# Patient Record
Sex: Male | Born: 1935 | Race: White | Hispanic: No | Marital: Married | Smoking: Former smoker
Health system: Southern US, Community
[De-identification: ages and names within clinical notes are randomized; demographics above are authoritative.]

## PROBLEM LIST (undated history)

## (undated) DIAGNOSIS — N390 Urinary tract infection, site not specified: Secondary | ICD-10-CM

## (undated) DIAGNOSIS — G2581 Restless legs syndrome: Secondary | ICD-10-CM

## (undated) DIAGNOSIS — J449 Chronic obstructive pulmonary disease, unspecified: Secondary | ICD-10-CM

## (undated) DIAGNOSIS — E785 Hyperlipidemia, unspecified: Secondary | ICD-10-CM

## (undated) DIAGNOSIS — D649 Anemia, unspecified: Secondary | ICD-10-CM

## (undated) DIAGNOSIS — I82409 Acute embolism and thrombosis of unspecified deep veins of unspecified lower extremity: Secondary | ICD-10-CM

## (undated) DIAGNOSIS — I639 Cerebral infarction, unspecified: Secondary | ICD-10-CM

## (undated) HISTORY — DX: Cerebral infarction, unspecified: I63.9

---

## 2011-04-10 ENCOUNTER — Ambulatory Visit: Payer: Self-pay | Admitting: Ophthalmology

## 2011-04-10 DIAGNOSIS — I499 Cardiac arrhythmia, unspecified: Secondary | ICD-10-CM

## 2011-04-17 ENCOUNTER — Ambulatory Visit: Payer: Self-pay | Admitting: Ophthalmology

## 2011-06-05 ENCOUNTER — Ambulatory Visit: Payer: Self-pay | Admitting: Ophthalmology

## 2013-10-30 HISTORY — PX: CATARACT EXTRACTION: SUR2

## 2014-07-02 DIAGNOSIS — J449 Chronic obstructive pulmonary disease, unspecified: Secondary | ICD-10-CM | POA: Insufficient documentation

## 2017-11-16 DIAGNOSIS — N401 Enlarged prostate with lower urinary tract symptoms: Secondary | ICD-10-CM | POA: Insufficient documentation

## 2017-11-16 DIAGNOSIS — R351 Nocturia: Secondary | ICD-10-CM

## 2018-01-28 DIAGNOSIS — I82409 Acute embolism and thrombosis of unspecified deep veins of unspecified lower extremity: Secondary | ICD-10-CM

## 2018-01-28 HISTORY — DX: Acute embolism and thrombosis of unspecified deep veins of unspecified lower extremity: I82.409

## 2018-02-23 ENCOUNTER — Other Ambulatory Visit: Payer: Self-pay

## 2018-02-23 ENCOUNTER — Emergency Department: Payer: Medicare Other

## 2018-02-23 ENCOUNTER — Emergency Department
Admission: EM | Admit: 2018-02-23 | Discharge: 2018-02-23 | Disposition: A | Discharge status: Home / Self Care | Payer: Medicare Other | Source: Home / Self Care | Attending: Emergency Medicine | Admitting: Emergency Medicine

## 2018-02-23 DIAGNOSIS — S51011A Laceration without foreign body of right elbow, initial encounter: Secondary | ICD-10-CM | POA: Insufficient documentation

## 2018-02-23 DIAGNOSIS — Z79899 Other long term (current) drug therapy: Secondary | ICD-10-CM | POA: Insufficient documentation

## 2018-02-23 DIAGNOSIS — W19XXXA Unspecified fall, initial encounter: Secondary | ICD-10-CM

## 2018-02-23 DIAGNOSIS — J449 Chronic obstructive pulmonary disease, unspecified: Secondary | ICD-10-CM

## 2018-02-23 DIAGNOSIS — Y998 Other external cause status: Secondary | ICD-10-CM | POA: Insufficient documentation

## 2018-02-23 DIAGNOSIS — W01190A Fall on same level from slipping, tripping and stumbling with subsequent striking against furniture, initial encounter: Secondary | ICD-10-CM | POA: Insufficient documentation

## 2018-02-23 DIAGNOSIS — Y939 Activity, unspecified: Secondary | ICD-10-CM | POA: Insufficient documentation

## 2018-02-23 DIAGNOSIS — I63522 Cerebral infarction due to unspecified occlusion or stenosis of left anterior cerebral artery: Secondary | ICD-10-CM | POA: Diagnosis not present

## 2018-02-23 DIAGNOSIS — R531 Weakness: Secondary | ICD-10-CM | POA: Insufficient documentation

## 2018-02-23 DIAGNOSIS — Z23 Encounter for immunization: Secondary | ICD-10-CM

## 2018-02-23 DIAGNOSIS — Y929 Unspecified place or not applicable: Secondary | ICD-10-CM

## 2018-02-23 HISTORY — DX: Restless legs syndrome: G25.81

## 2018-02-23 HISTORY — DX: Chronic obstructive pulmonary disease, unspecified: J44.9

## 2018-02-23 LAB — CBC
HEMATOCRIT: 34.7 % — AB (ref 40.0–52.0)
Hemoglobin: 11.5 g/dL — ABNORMAL LOW (ref 13.0–18.0)
MCH: 27.8 pg (ref 26.0–34.0)
MCHC: 33.2 g/dL (ref 32.0–36.0)
MCV: 83.7 fL (ref 80.0–100.0)
PLATELETS: 276 10*3/uL (ref 150–440)
RBC: 4.15 MIL/uL — ABNORMAL LOW (ref 4.40–5.90)
RDW: 15.4 % — AB (ref 11.5–14.5)
WBC: 8.7 10*3/uL (ref 3.8–10.6)

## 2018-02-23 LAB — COMPREHENSIVE METABOLIC PANEL
ALBUMIN: 3 g/dL — AB (ref 3.5–5.0)
ALT: 10 U/L — ABNORMAL LOW (ref 17–63)
AST: 23 U/L (ref 15–41)
Alkaline Phosphatase: 65 U/L (ref 38–126)
Anion gap: 6 (ref 5–15)
BUN: 25 mg/dL — AB (ref 6–20)
CHLORIDE: 102 mmol/L (ref 101–111)
CO2: 27 mmol/L (ref 22–32)
Calcium: 8.5 mg/dL — ABNORMAL LOW (ref 8.9–10.3)
Creatinine, Ser: 1.04 mg/dL (ref 0.61–1.24)
GFR calc Af Amer: >60 mL/min (ref >60)
GFR calc non Af Amer: >60 mL/min (ref >60)
GLUCOSE: 105 mg/dL — AB (ref 65–99)
POTASSIUM: 4.4 mmol/L (ref 3.5–5.1)
Sodium: 135 mmol/L (ref 135–145)
Total Bilirubin: 0.5 mg/dL (ref 0.3–1.2)
Total Protein: 7.9 g/dL (ref 6.5–8.1)

## 2018-02-23 LAB — TROPONIN I: Troponin I: <0.03 ng/mL (ref <0.03)

## 2018-02-23 MED ORDER — TETANUS-DIPHTH-ACELL PERTUSSIS 5-2.5-18.5 LF-MCG/0.5 IM SUSP: 0.5000 mL | Freq: Once | INTRAMUSCULAR | Status: DC

## 2018-02-23 MED ORDER — TETANUS-DIPHTH-ACELL PERTUSSIS 5-2.5-18.5 LF-MCG/0.5 IM SUSP
0.5000 mL | Freq: Once | INTRAMUSCULAR | Status: AC
  Administered 2018-02-23: 0.5 mL via INTRAMUSCULAR
  Filled 2018-02-23: qty 0.5

## 2018-02-23 NOTE — ED Notes (Signed)
Fall alarm placed on patient at this time.

## 2018-02-23 NOTE — Discharge Instructions (Signed)
Please make an appointment to follow-up with your primary care physician this coming Monday for reevaluation and return to the emergency department sooner for any concerns.  It was a pleasure to take care of you today, and thank you for coming to our emergency department.  If you have any questions or concerns before leaving please ask the nurse to grab me and I'm more than happy to go through your aftercare instructions again.  If you were prescribed any opioid pain medication today such as Norco, Vicodin, Percocet, morphine, hydrocodone, or oxycodone please make sure you do not drive when you are taking this medication as it can alter your ability to drive safely.  If you have any concerns once you are home that you are not improving or are in fact getting worse before you can make it to your follow-up appointment, please do not hesitate to call 911 and come back for further evaluation.  Merrily Brittle, MD  Results for orders placed or performed during the hospital encounter of 02/23/18  CBC  Result Value Ref Range   WBC 8.7 3.8 - 10.6 K/uL   RBC 4.15 (L) 4.40 - 5.90 MIL/uL   Hemoglobin 11.5 (L) 13.0 - 18.0 g/dL   HCT 18.5 (L) 90.9 - 31.1 %   MCV 83.7 80.0 - 100.0 fL   MCH 27.8 26.0 - 34.0 pg   MCHC 33.2 32.0 - 36.0 g/dL   RDW 21.6 (H) 24.4 - 69.5 %   Platelets 276 150 - 440 K/uL  Comprehensive metabolic panel  Result Value Ref Range   Sodium 135 135 - 145 mmol/L   Potassium 4.4 3.5 - 5.1 mmol/L   Chloride 102 101 - 111 mmol/L   CO2 27 22 - 32 mmol/L   Glucose, Bld 105 (H) 65 - 99 mg/dL   BUN 25 (H) 6 - 20 mg/dL   Creatinine, Ser 0.72 0.61 - 1.24 mg/dL   Calcium 8.5 (L) 8.9 - 10.3 mg/dL   Total Protein 7.9 6.5 - 8.1 g/dL   Albumin 3.0 (L) 3.5 - 5.0 g/dL   AST 23 15 - 41 U/L   ALT 10 (L) 17 - 63 U/L   Alkaline Phosphatase 65 38 - 126 U/L   Total Bilirubin 0.5 0.3 - 1.2 mg/dL   GFR calc non Af Amer >60 >60 mL/min   GFR calc Af Amer >60 >60 mL/min   Anion gap 6 5 - 15    Troponin I  Result Value Ref Range   Troponin I <0.03 <0.03 ng/mL   Dg Chest 1 View  Result Date: 02/23/2018 CLINICAL DATA:  Nonsmoker, imbalance, frequent falls. EXAM: CHEST  1 VIEW COMPARISON:  None. FINDINGS: Cardiomegaly. No consolidation or edema. Slight blunting both CP angles. No visible pneumothorax or rib fracture. IMPRESSION: Cardiomegaly.  No active infiltrates or failure. Electronically Signed   By: Elsie Stain M.D.   On: 02/23/2018 15:06   Ct Head Wo Contrast  Result Date: 02/23/2018 CLINICAL DATA:  Multiple falls recently, weak, unsteady gait. EXAM: CT HEAD WITHOUT CONTRAST TECHNIQUE: Contiguous axial images were obtained from the base of the skull through the vertex without intravenous contrast. COMPARISON:  None. FINDINGS: Brain: No evidence for acute infarction, hemorrhage, mass lesion, hydrocephalus, or extra-axial fluid. Generalized atrophy. Hypoattenuation of white matter, chronic microvascular ischemic change. Vascular: Calcification of the cavernous internal carotid arteries consistent with cerebrovascular atherosclerotic disease. No signs of intracranial large vessel occlusion. Skull: Calvarium intact. Sinuses/Orbits: No significant opacity or layering fluid in the sinuses.  BILATERAL cataract extraction but no acute orbital findings. Other: No significant mastoid fluid. IMPRESSION: Atrophy and small vessel disease.  No acute intracranial findings. Electronically Signed   By: Elsie Stain M.D.   On: 02/23/2018 15:58

## 2018-02-23 NOTE — ED Notes (Signed)
NAD noted at time of D/C. Pt D/C into the care of his wife. Pt's wife signed for D/C instructions. Steri-strips and dry bandage applied to skin tear on R elbow at this time. Pt and wife deny any questions regarding D/C.

## 2018-02-23 NOTE — ED Triage Notes (Signed)
Pt arrives from home via [facility] for multiple falls. Pt fell yesterday and today. Arrives with 20 R FA. R elbow skin tear. Denies hitting head. Uses cane at home. Pt states he hit the bed when he fell. Denies blood thinner use. Alert and oriented. EMS reports pt being unable to walk, very unsteady. BP sitting 130, BP standing 110. CBG 126. No cardiac hx.

## 2018-02-23 NOTE — ED Provider Notes (Signed)
[facility] Emergency Department Provider Note  ____________________________________________   First MD Initiated Contact with Patient 02/23/18 1411     (approximate)  I have reviewed the triage vital signs and the nursing notes.   HISTORY  Chief Complaint Fall   HPI Allen Potter is a 84 y.o. male who comes to the emergency department via EMS with multiple falls for the past several days.  The patient himself states that he feels somewhat lightheaded and has had difficulty ambulating.  He has no headache.  He normally ambulates with a cane.  Today when he fell he hit his right elbow against the bedside dresser and suffered a small skin tear.  He is currently not in any pain.  He does feel somewhat lightheaded.  He denies chest pain or palpitations.  He denies numbness or weakness.  He denies double vision or blurred vision.  He denies vertigo.  Past Medical History:  Diagnosis Date  . COPD (chronic obstructive pulmonary disease) (HCC)   . Restless leg     There are no active problems to display for this patient.   History reviewed. No pertinent surgical history.  Prior to Admission medications   Medication Sig Start Date End Date Taking? Authorizing Provider  albuterol (VENTOLIN HFA) 108 (90 Base) MCG/ACT inhaler Inhale 2 puffs into the lungs every 6 (six) hours as needed. 10/02/17   [provider]  ferrous sulfate 325 (65 FE) MG tablet Take 1 tablet by mouth daily. 12/31/17 12/31/18  [provider]  fluticasone furoate-vilanterol (BREO ELLIPTA) 100-25 MCG/INH AEPB Inhale 1 puff into the lungs daily. 10/02/17   [provider]  tiotropium (SPIRIVA HANDIHALER) 18 MCG inhalation capsule Place 1 puff into inhaler and inhale daily. 10/02/17   [provider]    Allergies Patient has no known allergies.  History reviewed. No pertinent family history.  Social History Social History   Tobacco Use  . Smoking status:  Never Smoker  . Smokeless tobacco: Never Used  Substance Use Topics  . Alcohol use: Not Currently    Frequency: Never  . Drug use: Not on file    Review of Systems Constitutional: No fever/chills Eyes: No visual changes. ENT: No sore throat. Cardiovascular: Denies chest pain. Respiratory: Denies shortness of breath. Gastrointestinal: No abdominal pain.  No nausea, no vomiting.  No diarrhea.  No constipation. Genitourinary: Negative for dysuria. Musculoskeletal: Negative for back pain. Skin: Positive for skin tear Neurological: Negative for headaches, focal weakness or numbness.   ____________________________________________   PHYSICAL EXAM:  VITAL SIGNS: ED Triage Vitals  Enc Vitals Group     BP 02/23/18 1406 133/70     Pulse Rate 02/23/18 1406 89     Resp 02/23/18 1406 (!) 22     Temp 02/23/18 1406 98.3 F (36.8 C)     Temp Source 02/23/18 1406 Oral     SpO2 02/23/18 1406 95 %     Weight 02/23/18 1403 170 lb (77.1 kg)     Height 02/23/18 1403 5\' 9"  (1.753 m)     Head Circumference --      Peak Flow --      Pain Score 02/23/18 1403 0     Pain Loc --      Pain Edu? --      Excl. in GC? --     Constitutional: Alert and oriented x4 pleasant cooperative speaks in full clear sentences no diaphoresis Eyes: PERRL EOMI. no nystagmus appreciated Head: Atraumatic. Nose: No  congestion/rhinnorhea. Mouth/Throat: No trismus Neck: No stridor.   Cardiovascular: Normal rate, regular rhythm. Grossly normal heart sounds.  Good peripheral circulation. Respiratory: Normal respiratory effort.  No retractions. Lungs CTAB and moving good air Gastrointestinal: Soft nontender Musculoskeletal: No lower extremity edema   Neurologic:  Normal speech and language. No gross focal neurologic deficits are appreciated. Skin: Small skin tear to right elbow Psychiatric: Mood and affect are normal. Speech and behavior are normal.    ____________________________________________     DIFFERENTIAL includes but not limited to  Cardiogenic syncope, vasovagal syncope, dehydration, pulmonary embolism ____________________________________________   LABS (all labs ordered are listed, but only abnormal results are displayed)  Labs Reviewed  CBC - Abnormal; Notable for the following components:      Result Value   RBC 4.15 (*)    Hemoglobin 11.5 (*)    HCT 34.7 (*)    RDW 15.4 (*)    All other components within normal limits  COMPREHENSIVE METABOLIC PANEL - Abnormal; Notable for the following components:   Glucose, Bld 105 (*)    BUN 25 (*)    Calcium 8.5 (*)    Albumin 3.0 (*)    ALT 10 (*)    All other components within normal limits  TROPONIN I    Lab work reviewed by me with low albumin consistent with chronically poor nutrition __________________________________________  EKG  ED ECG REPORT I, Merrily Brittle, the attending physician, personally viewed and interpreted this ECG.  Date: 02/23/2018 EKG Time:  Rate: 91 Rhythm: normal sinus rhythm QRS Axis: Leftward axis Intervals: Long QTc ST/T Wave abnormalities: normal Narrative Interpretation: no evidence of acute ischemia  ____________________________________________  RADIOLOGY  Head CT reviewed by me with chronic changes but no acute disease ____________________________________________   PROCEDURES  Procedure(s) performed: no  Procedures  Critical Care performed: no  Observation: no ____________________________________________   INITIAL IMPRESSION / ASSESSMENT AND PLAN / ED COURSE  Pertinent labs & imaging results that were available during my care of the patient were reviewed by me and considered in my medical decision making (see chart for details).  The patient arrives somewhat lightheaded after several falls recently.  He has normal strength in all 4 extremities.  Head CT obtained given his age and fall and fortunately is negative for bleed.  EKG shows a left bundle branch block  although no signs of acute ischemia.  He currently feels well.  I had a lengthy discussion with the patient and his wife and I offered to keep the patient in the emergency department for physical therapy and social work evaluation as the patient has had increasing falls however they declined stating they are comfortable having him go home and be evaluated as an outpatient.  Patient is discharged home in improved condition with strict return precautions.        ____________________________________________   FINAL CLINICAL IMPRESSION(S) / ED DIAGNOSES  Final diagnoses:  Fall, initial encounter  Skin tear of right elbow without complication, initial encounter      NEW MEDICATIONS STARTED DURING THIS VISIT:  There are no discharge medications for this patient.    Note:  This document was prepared using Dragon voice recognition software and may include unintentional dictation errors.     Merrily Brittle, MD 02/26/18 2146

## 2018-02-25 ENCOUNTER — Inpatient Hospital Stay: Payer: Medicare Other

## 2018-02-25 ENCOUNTER — Other Ambulatory Visit: Payer: Self-pay

## 2018-02-25 ENCOUNTER — Inpatient Hospital Stay
Admission: AD | Admit: 2018-02-25 | Discharge: 2018-02-28 | DRG: 065 | Disposition: A | Discharge status: Skilled Nursing Facility | Payer: Medicare Other | Source: Ambulatory Visit | Attending: Internal Medicine | Admitting: Internal Medicine

## 2018-02-25 DIAGNOSIS — R4781 Slurred speech: Secondary | ICD-10-CM | POA: Diagnosis present

## 2018-02-25 DIAGNOSIS — I63322 Cerebral infarction due to thrombosis of left anterior cerebral artery: Secondary | ICD-10-CM | POA: Diagnosis not present

## 2018-02-25 DIAGNOSIS — Z23 Encounter for immunization: Secondary | ICD-10-CM

## 2018-02-25 DIAGNOSIS — R296 Repeated falls: Secondary | ICD-10-CM | POA: Diagnosis present

## 2018-02-25 DIAGNOSIS — R29710 NIHSS score 10: Secondary | ICD-10-CM | POA: Diagnosis present

## 2018-02-25 DIAGNOSIS — N400 Enlarged prostate without lower urinary tract symptoms: Secondary | ICD-10-CM | POA: Diagnosis present

## 2018-02-25 DIAGNOSIS — G8191 Hemiplegia, unspecified affecting right dominant side: Secondary | ICD-10-CM | POA: Diagnosis present

## 2018-02-25 DIAGNOSIS — J449 Chronic obstructive pulmonary disease, unspecified: Secondary | ICD-10-CM | POA: Diagnosis present

## 2018-02-25 DIAGNOSIS — I63522 Cerebral infarction due to unspecified occlusion or stenosis of left anterior cerebral artery: Principal | ICD-10-CM | POA: Diagnosis present

## 2018-02-25 DIAGNOSIS — Z66 Do not resuscitate: Secondary | ICD-10-CM | POA: Diagnosis present

## 2018-02-25 DIAGNOSIS — Z9114 Patient's other noncompliance with medication regimen: Secondary | ICD-10-CM

## 2018-02-25 DIAGNOSIS — I639 Cerebral infarction, unspecified: Secondary | ICD-10-CM | POA: Diagnosis not present

## 2018-02-25 DIAGNOSIS — E860 Dehydration: Secondary | ICD-10-CM | POA: Diagnosis present

## 2018-02-25 DIAGNOSIS — G2581 Restless legs syndrome: Secondary | ICD-10-CM | POA: Diagnosis present

## 2018-02-25 DIAGNOSIS — N179 Acute kidney failure, unspecified: Secondary | ICD-10-CM | POA: Diagnosis present

## 2018-02-25 LAB — CBC
HEMATOCRIT: 36.7 % — AB (ref 40.0–52.0)
HEMOGLOBIN: 12.3 g/dL — AB (ref 13.0–18.0)
MCH: 28.4 pg (ref 26.0–34.0)
MCHC: 33.5 g/dL (ref 32.0–36.0)
MCV: 84.5 fL (ref 80.0–100.0)
Platelets: 265 10*3/uL (ref 150–440)
RBC: 4.34 MIL/uL — AB (ref 4.40–5.90)
RDW: 15.9 % — ABNORMAL HIGH (ref 11.5–14.5)
WBC: 9.7 10*3/uL (ref 3.8–10.6)

## 2018-02-25 LAB — COMPREHENSIVE METABOLIC PANEL
ALT: 16 U/L — AB (ref 17–63)
AST: 30 U/L (ref 15–41)
Albumin: 3.1 g/dL — ABNORMAL LOW (ref 3.5–5.0)
Alkaline Phosphatase: 70 U/L (ref 38–126)
Anion gap: 8 (ref 5–15)
BILIRUBIN TOTAL: 0.5 mg/dL (ref 0.3–1.2)
BUN: 35 mg/dL — ABNORMAL HIGH (ref 6–20)
CO2: 29 mmol/L (ref 22–32)
CREATININE: 1.13 mg/dL (ref 0.61–1.24)
Calcium: 8.9 mg/dL (ref 8.9–10.3)
Chloride: 101 mmol/L (ref 101–111)
GFR calc Af Amer: >60 mL/min (ref >60)
GFR, EST NON AFRICAN AMERICAN: 56 mL/min — AB (ref >60)
GLUCOSE: 98 mg/dL (ref 65–99)
Potassium: 4.7 mmol/L (ref 3.5–5.1)
Sodium: 138 mmol/L (ref 135–145)
TOTAL PROTEIN: 8.4 g/dL — AB (ref 6.5–8.1)

## 2018-02-25 LAB — PROTIME-INR
INR: 1.19
Prothrombin Time: 15 seconds (ref 11.4–15.2)

## 2018-02-25 MED ORDER — ACETAMINOPHEN 325 MG PO TABS: 650.0000 mg | ORAL_TABLET | ORAL | Status: DC | PRN

## 2018-02-25 MED ORDER — IPRATROPIUM-ALBUTEROL 0.5-2.5 (3) MG/3ML IN SOLN: 3.0000 mL | Freq: Four times a day (QID) | RESPIRATORY_TRACT | Status: DC | PRN

## 2018-02-25 MED ORDER — HEPARIN SODIUM (PORCINE) 5000 UNIT/ML IJ SOLN
5000.0000 [IU] | Freq: Three times a day (TID) | INTRAMUSCULAR | Status: DC
  Administered 2018-02-25 – 2018-02-28 (×7): 5000 [IU] via SUBCUTANEOUS
  Filled 2018-02-25 (×7): qty 1

## 2018-02-25 MED ORDER — ACETAMINOPHEN 160 MG/5ML PO SOLN
650.0000 mg | ORAL | Status: DC | PRN
  Filled 2018-02-25: qty 20.3

## 2018-02-25 MED ORDER — SIMVASTATIN 20 MG PO TABS
40.0000 mg | ORAL_TABLET | Freq: Every day | ORAL | Status: DC
  Administered 2018-02-26 – 2018-02-27 (×2): 40 mg via ORAL
  Filled 2018-02-25 (×2): qty 2

## 2018-02-25 MED ORDER — ACETAMINOPHEN 650 MG RE SUPP: 650.0000 mg | RECTAL | Status: DC | PRN

## 2018-02-25 MED ORDER — STROKE: EARLY STAGES OF RECOVERY BOOK
Freq: Once | Status: AC
  Administered 2018-02-25: 18:00:00

## 2018-02-25 MED ORDER — HYDRALAZINE HCL 20 MG/ML IJ SOLN: 10.0000 mg | INTRAMUSCULAR | Status: DC | PRN

## 2018-02-25 MED ORDER — SENNOSIDES-DOCUSATE SODIUM 8.6-50 MG PO TABS
1.0000 | ORAL_TABLET | Freq: Every evening | ORAL | Status: DC | PRN
  Administered 2018-02-27: 1 via ORAL
  Filled 2018-02-25: qty 1

## 2018-02-25 MED ORDER — SODIUM CHLORIDE 0.9 % IV SOLN
INTRAVENOUS | Status: DC
  Administered 2018-02-25 – 2018-02-28 (×4): via INTRAVENOUS

## 2018-02-25 MED ORDER — ASPIRIN EC 325 MG PO TBEC
325.0000 mg | DELAYED_RELEASE_TABLET | Freq: Every day | ORAL | Status: DC
  Administered 2018-02-26 – 2018-02-27 (×2): 325 mg via ORAL
  Filled 2018-02-25 (×2): qty 1

## 2018-02-25 NOTE — Progress Notes (Signed)
Family Meeting Note  Advance Directive:yes  Today a meeting took place with the Patient, patient's wife, family friend.  Patient is able to participate   The following clinical team members were present during this meeting:MD  The following were discussed:Patient's diagnosis: CVA, BPH, COPD, frequent falls, acute kidney injury, dehydration, Patient's progosis: Unable to determine and Goals for treatment: DNR  Additional follow-up to be provided: prn  Time spent during discussion:20 minutes  Bertrum Sol, MD

## 2018-02-25 NOTE — Progress Notes (Signed)
   02/25/18 1839  Clinical Encounter Type  Visited With Patient  Visit Type Initial  Referral From Nurse  Consult/Referral To Chaplain   Chaplain provided information regarding advanced directives.  A notary will be available in the morning to complete documentation.  Deep Bonawitz Zenaida Niece [employer]

## 2018-02-25 NOTE — H&P (Signed)
[facility] - [place] at [facility]   PATIENT NAME: Allen Potter    MR#:  [id number]  DATE OF BIRTH:  1927/02/12  DATE OF ADMISSION:  02/25/2018  PRIMARY CARE PHYSICIAN: Barbette Reichmann, MD   REQUESTING/REFERRING PHYSICIAN:   CHIEF COMPLAINT:  No chief complaint on file.   HISTORY OF PRESENT ILLNESS: Allen Potter  is a 84 y.o. male with a known history per below which also includes COPD, BPH, psoriasis, cataracts, presents with falling events that started on Friday and continue through to today, patient with right upper extremity and lower extremity weakness, was evaluated in the emergency room over the weekend, patient was subsequently discharged home, patient seen by his primary care provider today and was accepted as a direct admission for acute cerebrovascular accident with right hemiparesis, patient evaluated at the bedside with his wife and family friend, noted personality change as patient is usually the more talkative jovial person, CT head over the weekend was negative for any acute process, examination consistent with dehydration, patient is now being admitted for acute probable ischemic supravascular accident, acute kidney injury with dehydration.  PAST MEDICAL HISTORY:   Past Medical History:  Diagnosis Date  . COPD (chronic obstructive pulmonary disease) (HCC)   . Restless leg     PAST SURGICAL HISTORY:  Cataract extraction  SOCIAL HISTORY:  Social History   Tobacco Use  . Smoking status: Never Smoker  . Smokeless tobacco: Never Used  Substance Use Topics  . Alcohol use: Not Currently    Frequency: Never    FAMILY HISTORY:  Hypertension  DRUG ALLERGIES: No Known Allergies  REVIEW OF SYSTEMS:   CONSTITUTIONAL: No fever, fatigue or weakness.  Frequent falls EYES: No blurred or double vision.  EARS, NOSE, AND THROAT: No tinnitus or ear pain.  RESPIRATORY: No cough, shortness of breath, wheezing or hemoptysis.  CARDIOVASCULAR: No chest pain,  orthopnea, edema.  GASTROINTESTINAL: No nausea, vomiting, diarrhea or abdominal pain.  GENITOURINARY: No dysuria, hematuria.  ENDOCRINE: No polyuria, nocturia,  HEMATOLOGY: No anemia, easy bruising or bleeding SKIN: No rash or lesion. MUSCULOSKELETAL: No joint pain or arthritis.   NEUROLOGIC: No tingling, numbness, right upper/lower extremity weakness.  Less verbal PSYCHIATRY: No anxiety or depression.   MEDICATIONS AT HOME:  Prior to Admission medications   Not on File      PHYSICAL EXAMINATION:   VITAL SIGNS: Blood pressure 129/83, pulse 94, temperature (!) 97.4 F (36.3 C), temperature source Oral, resp. rate 20, height 5\' 9"  (1.753 m), weight 70.1 kg (154 lb 9.6 oz), SpO2 98 %.  GENERAL:  84 y.o.-year-old patient lying in the bed with no acute distress.  Frail-appearing EYES: Pupils equal, round, reactive to light and accommodation. No scleral icterus. Extraocular muscles intact.  HEENT: Head atraumatic, normocephalic. Oropharynx and nasopharynx clear.  NECK:  Supple, no jugular venous distention. No thyroid enlargement, no tenderness.  LUNGS: Normal breath sounds bilaterally, no wheezing, rales,rhonchi or crepitation. No use of accessory muscles of respiration.  CARDIOVASCULAR: S1, S2 normal. No murmurs, rubs, or gallops.  ABDOMEN: Soft, nontender, nondistended. Bowel sounds present. No organomegaly or mass.  EXTREMITIES: No pedal edema, cyanosis, or clubbing.  NEUROLOGIC: Cranial nerves II through XII are intact. MAES.  No pronator drift, positive right upper extremity dysmetria, 4+ out of 5 strength in right upper and lower extremity compared to left gait not checked.  PSYCHIATRIC: The patient is alert, awake, mild confusion/disorientation noted   SKIN: No obvious rash, lesion, or ulcer.   LABORATORY PANEL:  CBC Recent Labs  Lab 02/23/18 1404  WBC 8.7  HGB 11.5*  HCT 34.7*  PLT 276  MCV 83.7  MCH 27.8  MCHC 33.2  RDW 15.4*    ------------------------------------------------------------------------------------------------------------------  Chemistries  Recent Labs  Lab 02/23/18 1404  NA 135  K 4.4  CL 102  CO2 27  GLUCOSE 105*  BUN 25*  CREATININE 1.04  CALCIUM 8.5*  AST 23  ALT 10*  ALKPHOS 65  BILITOT 0.5   ------------------------------------------------------------------------------------------------------------------ estimated creatinine clearance is 48.7 mL/min (by C-G formula based on SCr of 1.04 mg/dL). ------------------------------------------------------------------------------------------------------------------ No results for input(s): TSH, T4TOTAL, T3FREE, THYROIDAB in the last 72 hours.  Invalid input(s): FREET3   Coagulation profile No results for input(s): INR, PROTIME in the last 168 hours. ------------------------------------------------------------------------------------------------------------------- No results for input(s): DDIMER in the last 72 hours. -------------------------------------------------------------------------------------------------------------------  Cardiac Enzymes Recent Labs  Lab 02/23/18 1404  TROPONINI <0.03   ------------------------------------------------------------------------------------------------------------------ Invalid input(s): POCBNP  ---------------------------------------------------------------------------------------------------------------  Urinalysis No results found for: COLORURINE, APPEARANCEUR, LABSPEC, PHURINE, GLUCOSEU, HGBUR, BILIRUBINUR, KETONESUR, PROTEINUR, UROBILINOGEN, NITRITE, LEUKOCYTESUR   RADIOLOGY: No results found.  EKG: Orders placed or performed during the hospital encounter of 02/23/18  . ED EKG  . ED EKG  . EKG 12-Lead  . EKG 12-Lead  . EKG 12-Lead  . EKG 12-Lead    IMPRESSION AND PLAN: *Acute probable ischemic cerebral vascular accident Presenting with right hemiparesis, acute change in  behavior, less talkative, right upper extremity dysmetria, frequent falls starting on Friday Admit to regular nursing for bed as direct admission by primary care provider on our CVA protocol, neurology to see, start aspirin, statin therapy, check lipids in the morning, MRI of the brain, carotid Dopplers, echocardiogram, aspiration/fall functions, speech therapy/physical therapy to evaluate/treat, check admission blood work, EKG, chest x-ray, and continue close medical monitoring  *Acute probable acute kidney injury with dehydration Profound dry mucous membranes with poor skin turgor IV fluids for rehydration, follow-up of blood work  *COPD without exacerbation Stable Breathing treatments as needed  *Acute frequent falls Most likely secondary to above plan of care stated above   *Incomplete MAR Complete when available     All the records are reviewed and case discussed with ED provider. Management plans discussed with the patient, family and they are in agreement.  CODE STATUS:dnr    TOTAL TIME TAKING CARE OF THIS PATIENT: 45 minutes.    Evelena Asa Vadhir Mcnay M.D on 02/25/2018   Between 7am to 6pm - Pager - [phone]  After 6pm go to [email or web address] - password [email or web address]  [facility]  Office  [phone]  CC: Primary care physician; Barbette Reichmann, MD   Note: This dictation was prepared with Dragon dictation along with smaller phrase technology. Any transcriptional errors that result from this process are unintentional.

## 2018-02-26 ENCOUNTER — Inpatient Hospital Stay: Payer: Medicare Other

## 2018-02-26 DIAGNOSIS — I63322 Cerebral infarction due to thrombosis of left anterior cerebral artery: Secondary | ICD-10-CM

## 2018-02-26 LAB — BASIC METABOLIC PANEL
ANION GAP: 9 (ref 5–15)
BUN: 33 mg/dL — ABNORMAL HIGH (ref 6–20)
CHLORIDE: 103 mmol/L (ref 101–111)
CO2: 25 mmol/L (ref 22–32)
Calcium: 8.4 mg/dL — ABNORMAL LOW (ref 8.9–10.3)
Creatinine, Ser: 1 mg/dL (ref 0.61–1.24)
GFR calc Af Amer: >60 mL/min (ref >60)
GFR calc non Af Amer: >60 mL/min (ref >60)
Glucose, Bld: 78 mg/dL (ref 65–99)
POTASSIUM: 4.1 mmol/L (ref 3.5–5.1)
Sodium: 137 mmol/L (ref 135–145)

## 2018-02-26 LAB — LIPID PANEL
CHOL/HDL RATIO: 2.9 ratio
CHOLESTEROL: 137 mg/dL (ref 0–200)
HDL: 47 mg/dL (ref >40)
LDL Cholesterol: 75 mg/dL (ref 0–99)
TRIGLYCERIDES: 76 mg/dL (ref <150)
VLDL: 15 mg/dL (ref 0–40)

## 2018-02-26 LAB — HEMOGLOBIN A1C
Hgb A1c MFr Bld: 5.9 % — ABNORMAL HIGH (ref 4.8–5.6)
MEAN PLASMA GLUCOSE: 122.63 mg/dL

## 2018-02-26 NOTE — Progress Notes (Signed)
[employer] [facility] - [place] [facility]   PATIENT NAME: Allen Potter    MR#:  [id number]  DATE OF BIRTH:  [birth year]  SUBJECTIVE:  CHIEF COMPLAINT:  No chief complaint on file.  Better slurred speech and right-sided weakness. REVIEW OF SYSTEMS:  Review of Systems  Constitutional: Negative for chills, fever and malaise/fatigue.  HENT: Negative for sore throat.   Eyes: Negative for blurred vision and double vision.  Respiratory: Negative for cough, hemoptysis, shortness of breath, wheezing and stridor.   Cardiovascular: Negative for chest pain, palpitations, orthopnea and leg swelling.  Gastrointestinal: Negative for abdominal pain, blood in stool, diarrhea, melena, nausea and vomiting.  Genitourinary: Negative for dysuria, flank pain and hematuria.  Musculoskeletal: Negative for back pain and joint pain.  Skin: Negative for rash.  Neurological: Positive for speech change and focal weakness. Negative for dizziness, sensory change, seizures, loss of consciousness, weakness and headaches.  Endo/Heme/Allergies: Negative for polydipsia.  Psychiatric/Behavioral: Negative for depression. The patient is not nervous/anxious.     DRUG ALLERGIES:  No Known Allergies VITALS:  Blood pressure (!) 148/88, pulse 86, temperature 97.6 F (36.4 C), temperature source Oral, resp. rate 18, height 5\' 9"  (1.753 m), weight 154 lb 9.6 oz (70.1 kg), SpO2 95 %. PHYSICAL EXAMINATION:  Physical Exam  Constitutional: He is oriented to person, place, and time.  HENT:  Head: Normocephalic.  Mouth/Throat: Oropharynx is clear and moist.  Eyes: Pupils are equal, round, and reactive to light. Conjunctivae and EOM are normal. No scleral icterus.  Neck: Normal range of motion. Neck supple. No JVD present. No tracheal deviation present.  Cardiovascular: Normal rate, regular rhythm and normal heart sounds. Exam reveals no gallop.  No murmur heard. Pulmonary/Chest: Effort normal and breath sounds  normal. No respiratory distress. He has no wheezes. He has no rales.  Abdominal: Soft. Bowel sounds are normal. He exhibits no distension. There is no tenderness. There is no rebound.  Musculoskeletal: Normal range of motion. He exhibits no edema or tenderness.  Neurological: He is alert and oriented to person, place, and time.  Mild slurred speech and right sided weakness 4/5.  Skin: No rash noted. No erythema.  Psychiatric: He has a normal mood and affect.   LABORATORY PANEL:  Male CBC Recent Labs  Lab 02/25/18 1821  WBC 9.7  HGB 12.3*  HCT 36.7*  PLT 265   ------------------------------------------------------------------------------------------------------------------ Chemistries  Recent Labs  Lab 02/25/18 1821 02/26/18 0415  NA 138 137  K 4.7 4.1  CL 101 103  CO2 29 25  GLUCOSE 98 78  BUN 35* 33*  CREATININE 1.13 1.00  CALCIUM 8.9 8.4*  AST 30  --   ALT 16*  --   ALKPHOS 70  --   BILITOT 0.5  --    RADIOLOGY:  Dg Chest 2 View  Result Date: 02/26/2018 CLINICAL DATA:  CVA, weakness EXAM: CHEST - 2 VIEW COMPARISON:  02/23/2018 FINDINGS: No significant pleural effusion. Stable enlarged cardiomediastinal silhouette with mild central congestion. Mild diffuse interstitial opacity, likely chronic change. Patchy atelectasis or scar at the left base. No pneumothorax. IMPRESSION: 1. Mild cardiomegaly with minimal central vascular congestion 2. Tiny pleural effusion or thickening. Mild diffuse interstitial prominence, likely chronic interstitial change. Patchy atelectasis or scarring at the left lung base. Electronically Signed   By: Jasmine Pang M.D.   On: 02/26/2018 01:50   Mr Brain Wo Contrast  Result Date: 02/25/2018 CLINICAL DATA:  Initial evaluation for right-sided weakness, falls. EXAM: MRI HEAD WITHOUT  CONTRAST MRA HEAD WITHOUT CONTRAST TECHNIQUE: Multiplanar, multiecho pulse sequences of the brain and surrounding structures were obtained without intravenous contrast.  Angiographic images of the head were obtained using MRA technique without contrast. COMPARISON:  Prior CT from 02/23/2018. FINDINGS: MRI HEAD FINDINGS Brain: Age-related cerebral volume loss. Minimal T2/FLAIR hyperintensity within the periventricular and deep white matter both cerebral hemispheres, most consistent with chronic small vessel ischemic changes, felt to be within normal limits for age. Subcentimeter remote bilateral thalamic lacunar infarcts noted. There is moderate-size focus of abnormal restricted diffusion involving the parasagittal left frontal lobe, consistent with acute left ACA territory ischemic infarct (series 100, image 38). Infarct measures approximately 5.8 x 1.3 cm in size, with extension into the central and left genu of the corpus callosum. No associated hemorrhage or mass effect. No other evidence for acute or subacute ischemia. Gray-white matter differentiation otherwise maintained. No other areas of remote cortical infarction. No foci of susceptibility artifact to suggest acute or chronic intracranial hemorrhage. No mass lesion, midline shift or mass effect. Ventricles normal size without hydrocephalus. No extra-axial fluid collection. Major dural sinuses are grossly patent. Pituitary gland suprasellar region normal. Midline structures intact and normal. Vascular: Major intracranial vascular flow voids are maintained at the skull base. Skull and upper cervical spine: Craniocervical junction normal. Degenerative spondylolysis noted at C3-4 with resultant mild spinal stenosis. Upper cervical spine otherwise unremarkable. Bone marrow signal intensity within normal limits. No scalp soft tissue abnormality. Sinuses/Orbits: Globes and orbital soft tissues within normal limits. Patient status post lens extraction bilaterally. Paranasal sinuses are clear. Trace opacity bilateral mastoid air cells, of doubtful significance. Inner ear structures normal. Other: None. MRA HEAD FINDINGS ANTERIOR  CIRCULATION: Distal cervical segments of the internal carotid arteries are patent with antegrade flow. Petrous segments widely patent bilaterally. Cavernous and supraclinoid left ICA widely patent. There is moderate diffuse narrowing at the supraclinoid right ICA. ICA termini patent bilaterally. A1 segments patent without high-grade stenosis. Normal anterior communicating artery. There is a focal severe near occlusive stenosis at the proximal left A2 segment (series 7, image 86). Left ACA is patent distally. Right A2 segment patent without stenosis. Multifocal atheromatous irregularity with mild to moderate diffuse narrowing present throughout the M1 segments bilaterally. No proximal M2 occlusion. Distal MCA branches well perfused and fairly symmetric bilaterally, although demonstrate multifocal small vessel atheromatous irregularity. POSTERIOR CIRCULATION: Vertebral arteries are code dominant and patent to the vertebrobasilar junction without flow-limiting stenosis. Posterior inferior cerebral arteries patent proximally. Basilar artery mildly tortuous but widely patent to its distal aspect without stenosis. Superior cerebral arteries patent bilaterally. Both of the posterior cerebral arteries supplied via the basilar artery. Short-segment severe mid left P2 stenosis (series 7, image 83). Atheromatous irregularity throughout the remaining PCAs without additional focal high-grade stenosis. No aneurysm. IMPRESSION: MRI HEAD IMPRESSION 1. Moderate sized acute ischemic nonhemorrhagic left ACA territory infarct involving the parasagittal left frontal lobe. 2. No other acute intracranial abnormality. 3. Small remote bilateral thalamic lacunar infarcts. MRA HEAD IMPRESSION 1. Negative intracranial MRA for large vessel occlusion. 2. Short-segment severe near occlusive proximal left A2 stenosis. 3. Short-segment severe mid left P2 stenosis. 4. Additional atheromatous irregularity throughout the intracranial circulation as  above. No other high-grade or correctable stenosis identified. Electronically Signed   By: Rise Mu M.D.   On: 02/25/2018 23:03   US Carotid Bilateral (at [facility] And [facility] Only)  Result Date: 02/26/2018 CLINICAL DATA:  84 year old male with acute left ACA territory infarct EXAM: BILATERAL CAROTID DUPLEX ULTRASOUND TECHNIQUE:  Gray scale imaging, color Doppler and duplex ultrasound were performed of bilateral carotid and vertebral arteries in the neck. COMPARISON:  Brain MRI 02/25/2018 FINDINGS: Criteria: Quantification of carotid stenosis is based on velocity parameters that correlate the residual internal carotid diameter with NASCET-based stenosis levels, using the diameter of the distal internal carotid lumen as the denominator for stenosis measurement. The following velocity measurements were obtained: RIGHT ICA: 68/16 cm/sec CCA: 80/12 cm/sec SYSTOLIC ICA/CCA RATIO:  1.1 ECA:  53 cm/sec LEFT ICA: 81/27 cm/sec CCA: 64/10 cm/sec SYSTOLIC ICA/CCA RATIO:  1.0 ECA:  102 cm/sec RIGHT CAROTID ARTERY: Heterogeneous atherosclerotic plaque in the distal common carotid artery extending into the proximal internal carotid artery. By peak systolic velocity criteria, the estimated stenosis remains less than 50%. RIGHT VERTEBRAL ARTERY:  Patent with normal antegrade flow. LEFT CAROTID ARTERY: Mild heterogeneous atherosclerotic plaque in the proximal internal carotid artery. By peak systolic velocity criteria, the estimated stenosis remains less than 50%. LEFT VERTEBRAL ARTERY:  Patent with normal antegrade flow. IMPRESSION: 1. Mild (1-49%) stenosis proximal right internal carotid artery secondary to focal heterogeneous atherosclerotic plaque. 2. Mild (1-49%) stenosis proximal left internal carotid artery secondary to mild heterogeneous atherosclerotic plaque. 3. Vertebral arteries are patent with normal antegrade flow. Signed, Sterling Big, MD [facility]  Radiology Electronically Signed   By: Malachy Moan M.D.   On: 02/26/2018 10:48   Mr Maxine Glenn Head/brain YN Cm  Result Date: 02/25/2018 CLINICAL DATA:  Initial evaluation for right-sided weakness, falls. EXAM: MRI HEAD WITHOUT CONTRAST MRA HEAD WITHOUT CONTRAST TECHNIQUE: Multiplanar, multiecho pulse sequences of the brain and surrounding structures were obtained without intravenous contrast. Angiographic images of the head were obtained using MRA technique without contrast. COMPARISON:  Prior CT from 02/23/2018. FINDINGS: MRI HEAD FINDINGS Brain: Age-related cerebral volume loss. Minimal T2/FLAIR hyperintensity within the periventricular and deep white matter both cerebral hemispheres, most consistent with chronic small vessel ischemic changes, felt to be within normal limits for age. Subcentimeter remote bilateral thalamic lacunar infarcts noted. There is moderate-size focus of abnormal restricted diffusion involving the parasagittal left frontal lobe, consistent with acute left ACA territory ischemic infarct (series 100, image 38). Infarct measures approximately 5.8 x 1.3 cm in size, with extension into the central and left genu of the corpus callosum. No associated hemorrhage or mass effect. No other evidence for acute or subacute ischemia. Gray-white matter differentiation otherwise maintained. No other areas of remote cortical infarction. No foci of susceptibility artifact to suggest acute or chronic intracranial hemorrhage. No mass lesion, midline shift or mass effect. Ventricles normal size without hydrocephalus. No extra-axial fluid collection. Major dural sinuses are grossly patent. Pituitary gland suprasellar region normal. Midline structures intact and normal. Vascular: Major intracranial vascular flow voids are maintained at the skull base. Skull and upper cervical spine: Craniocervical junction normal. Degenerative spondylolysis noted at C3-4 with resultant mild spinal stenosis. Upper cervical spine  otherwise unremarkable. Bone marrow signal intensity within normal limits. No scalp soft tissue abnormality. Sinuses/Orbits: Globes and orbital soft tissues within normal limits. Patient status post lens extraction bilaterally. Paranasal sinuses are clear. Trace opacity bilateral mastoid air cells, of doubtful significance. Inner ear structures normal. Other: None. MRA HEAD FINDINGS ANTERIOR CIRCULATION: Distal cervical segments of the internal carotid arteries are patent with antegrade flow. Petrous segments widely patent bilaterally. Cavernous and supraclinoid left ICA widely patent. There is moderate diffuse narrowing at the supraclinoid right ICA. ICA termini patent bilaterally. A1 segments patent without high-grade stenosis. Normal anterior  communicating artery. There is a focal severe near occlusive stenosis at the proximal left A2 segment (series 7, image 86). Left ACA is patent distally. Right A2 segment patent without stenosis. Multifocal atheromatous irregularity with mild to moderate diffuse narrowing present throughout the M1 segments bilaterally. No proximal M2 occlusion. Distal MCA branches well perfused and fairly symmetric bilaterally, although demonstrate multifocal small vessel atheromatous irregularity. POSTERIOR CIRCULATION: Vertebral arteries are code dominant and patent to the vertebrobasilar junction without flow-limiting stenosis. Posterior inferior cerebral arteries patent proximally. Basilar artery mildly tortuous but widely patent to its distal aspect without stenosis. Superior cerebral arteries patent bilaterally. Both of the posterior cerebral arteries supplied via the basilar artery. Short-segment severe mid left P2 stenosis (series 7, image 83). Atheromatous irregularity throughout the remaining PCAs without additional focal high-grade stenosis. No aneurysm. IMPRESSION: MRI HEAD IMPRESSION 1. Moderate sized acute ischemic nonhemorrhagic left ACA territory infarct involving the  parasagittal left frontal lobe. 2. No other acute intracranial abnormality. 3. Small remote bilateral thalamic lacunar infarcts. MRA HEAD IMPRESSION 1. Negative intracranial MRA for large vessel occlusion. 2. Short-segment severe near occlusive proximal left A2 stenosis. 3. Short-segment severe mid left P2 stenosis. 4. Additional atheromatous irregularity throughout the intracranial circulation as above. No other high-grade or correctable stenosis identified. Electronically Signed   By: Rise Mu M.D.   On: 02/25/2018 23:03   ASSESSMENT AND PLAN:   *Acute ischemic cerebral vascular accident  Continue aspirin, statin therapy,  Acute CVA per MRI of the brain,  Pending carotid Dopplers, echocardiogram, He passed speech study.  Aspiration/fall precaution Physical therapy to evaluate/treat.  *Acute probable acute kidney injury with dehydration Improving with IV fluid support.  *COPD without exacerbation Stable Breathing treatments as needed  *Acute frequent falls Most likely secondary to above plan of care stated above  I discussed with Dr. Loretha Brasil. All the records are reviewed and case discussed with Care Management/Social Worker. Management plans discussed with the patient, his wife and they are in agreement.  CODE STATUS: DNR  TOTAL TIME TAKING CARE OF THIS PATIENT: 33 minutes.   More than 50% of the time was spent in counseling/coordination of care: YES  POSSIBLE D/C IN 1- 2DAYS, DEPENDING ON CLINICAL CONDITION.   Shaune Pollack M.D on 02/26/2018 at 12:51 PM  Between 7am to 6pm - Pager - [phone]  After 6pm go to [email or web address] - Therapist, [facility]

## 2018-02-26 NOTE — Consult Note (Signed)
Reason for Consult: R side weakness  Referring Physician: Dr. Katheren Shams   CC: R side weakness   HPI: Allen Potter is an 84 y.o. male with hxCOPD, BPH, psoriasis, cataracts, presents with falling events that started on Friday and continue through to today, patient with right upper extremity and lower extremity weakness, was evaluated in the emergency room over the weekend, patient was subsequently discharged home. Pt comes in for further evaluation and he has L ACA stroke on MRI.      Past Medical History:  Diagnosis Date  . COPD (chronic obstructive pulmonary disease) (HCC)   . Restless leg     History reviewed. No pertinent surgical history.  History reviewed. No pertinent family history.  Social History:  reports that he has never smoked. He has never used smokeless tobacco. He reports that he drank alcohol. His drug history is not on file.  No Known Allergies  Medications: I have reviewed the patient's current medications.  ROS: History obtained from the patient  General ROS: negative for - chills, fatigue, fever, night sweats, weight gain or weight loss Psychological ROS: negative for - behavioral disorder, hallucinations, memory difficulties, mood swings or suicidal ideation Ophthalmic ROS: negative for - blurry vision, double vision, eye pain or loss of vision ENT ROS: negative for - epistaxis, nasal discharge, oral lesions, sore throat, tinnitus or vertigo Allergy and Immunology ROS: negative for - hives or itchy/watery eyes Hematological and Lymphatic ROS: negative for - bleeding problems, bruising or swollen lymph nodes Endocrine ROS: negative for - galactorrhea, hair pattern changes, polydipsia/polyuria or temperature intolerance Respiratory ROS: negative for - cough, hemoptysis, shortness of breath or wheezing Cardiovascular ROS: negative for - chest pain, dyspnea on exertion, edema or irregular heartbeat Gastrointestinal ROS: negative for - abdominal pain, diarrhea,  hematemesis, nausea/vomiting or stool incontinence Genito-Urinary ROS: negative for - dysuria, hematuria, incontinence or urinary frequency/urgency Musculoskeletal ROS: negative for - joint swelling or muscular weakness Neurological ROS: as noted in HPI Dermatological ROS: negative for rash and skin lesion changes  Physical Examination: Blood pressure (!) 148/88, pulse 86, temperature 97.6 F (36.4 C), temperature source Oral, resp. rate 18, height 5\' 9"  (1.753 m), weight 154 lb 9.6 oz (70.1 kg), SpO2 95 %.   Neurological Examination   Mental Status: Alert, oriented. Dysarthria but as per family this is baseline Cranial Nerves: II: Discs flat bilaterally; Visual fields grossly normal, pupils equal, round, reactive to light and accommodation III,IV, VI: ptosis not present, extra-ocular motions intact bilaterally V,VII: smile symmetric, facial light touch sensation normal bilaterally VIII: hearing normal bilaterally IX,X: gag reflex present XI: bilateral shoulder shrug XII: midline tongue extension Motor: Right : Upper extremity   4+/5    Left:     Upper extremity   5/5  Lower extremity   4/5     Lower extremity   5/5 Tone and bulk:normal tone throughout; no atrophy noted Sensory: Pinprick and light touch intact throughout, bilaterally Deep Tendon Reflexes: 1+ and symmetric throughout Plantars: Right: downgoing   Left: downgoing Cerebellar: normal finger-to-nose Gait: not tested      Laboratory Studies:   Basic Metabolic Panel: Recent Labs  Lab 02/23/18 1404 02/25/18 1821 02/26/18 0415  NA 135 138 137  K 4.4 4.7 4.1  CL 102 101 103  CO2 27 29 25   GLUCOSE 105* 98 78  BUN 25* 35* 33*  CREATININE 1.04 1.13 1.00  CALCIUM 8.5* 8.9 8.4*    Liver Function Tests: Recent Labs  Lab 02/23/18 1404 02/25/18  1821  AST 23 30  ALT 10* 16*  ALKPHOS 65 70  BILITOT 0.5 0.5  PROT 7.9 8.4*  ALBUMIN 3.0* 3.1*   No results for input(s): LIPASE, AMYLASE in the last 168  hours. No results for input(s): AMMONIA in the last 168 hours.  CBC: Recent Labs  Lab 02/23/18 1404 02/25/18 1821  WBC 8.7 9.7  HGB 11.5* 12.3*  HCT 34.7* 36.7*  MCV 83.7 84.5  PLT 276 265    Cardiac Enzymes: Recent Labs  Lab 02/23/18 1404  TROPONINI <0.03    BNP: Invalid input(s): POCBNP  CBG: No results for input(s): GLUCAP in the last 168 hours.  Microbiology: No results found for this or any previous visit.  Coagulation Studies: Recent Labs    02/25/18 1821  LABPROT 15.0  INR 1.19    Urinalysis: No results for input(s): COLORURINE, LABSPEC, PHURINE, GLUCOSEU, HGBUR, BILIRUBINUR, KETONESUR, PROTEINUR, UROBILINOGEN, NITRITE, LEUKOCYTESUR in the last 168 hours.  Invalid input(s): APPERANCEUR  Lipid Panel:     Component Value Date/Time   CHOL 137 02/26/2018 0415   TRIG 76 02/26/2018 0415   HDL 47 02/26/2018 0415   CHOLHDL 2.9 02/26/2018 0415   VLDL 15 02/26/2018 0415   LDLCALC 75 02/26/2018 0415    HgbA1C:  Lab Results  Component Value Date   HGBA1C 5.9 (H) 02/26/2018    Urine Drug Screen:  No results found for: LABOPIA, COCAINSCRNUR, LABBENZ, AMPHETMU, THCU, LABBARB  Alcohol Level: No results for input(s): ETH in the last 168 hours.  Other results: EKG: normal EKG, normal sinus rhythm, unchanged from previous tracings.  Imaging: Dg Chest 2 View  Result Date: 02/26/2018 CLINICAL DATA:  CVA, weakness EXAM: CHEST - 2 VIEW COMPARISON:  02/23/2018 FINDINGS: No significant pleural effusion. Stable enlarged cardiomediastinal silhouette with mild central congestion. Mild diffuse interstitial opacity, likely chronic change. Patchy atelectasis or scar at the left base. No pneumothorax. IMPRESSION: 1. Mild cardiomegaly with minimal central vascular congestion 2. Tiny pleural effusion or thickening. Mild diffuse interstitial prominence, likely chronic interstitial change. Patchy atelectasis or scarring at the left lung base. Electronically Signed   By: Jasmine Pang M.D.   On: 02/26/2018 01:50   Mr Brain Wo Contrast  Result Date: 02/25/2018 CLINICAL DATA:  Initial evaluation for right-sided weakness, falls. EXAM: MRI HEAD WITHOUT CONTRAST MRA HEAD WITHOUT CONTRAST TECHNIQUE: Multiplanar, multiecho pulse sequences of the brain and surrounding structures were obtained without intravenous contrast. Angiographic images of the head were obtained using MRA technique without contrast. COMPARISON:  Prior CT from 02/23/2018. FINDINGS: MRI HEAD FINDINGS Brain: Age-related cerebral volume loss. Minimal T2/FLAIR hyperintensity within the periventricular and deep white matter both cerebral hemispheres, most consistent with chronic small vessel ischemic changes, felt to be within normal limits for age. Subcentimeter remote bilateral thalamic lacunar infarcts noted. There is moderate-size focus of abnormal restricted diffusion involving the parasagittal left frontal lobe, consistent with acute left ACA territory ischemic infarct (series 100, image 38). Infarct measures approximately 5.8 x 1.3 cm in size, with extension into the central and left genu of the corpus callosum. No associated hemorrhage or mass effect. No other evidence for acute or subacute ischemia. Gray-white matter differentiation otherwise maintained. No other areas of remote cortical infarction. No foci of susceptibility artifact to suggest acute or chronic intracranial hemorrhage. No mass lesion, midline shift or mass effect. Ventricles normal size without hydrocephalus. No extra-axial fluid collection. Major dural sinuses are grossly patent. Pituitary gland suprasellar region normal. Midline structures intact and normal. Vascular: Major  intracranial vascular flow voids are maintained at the skull base. Skull and upper cervical spine: Craniocervical junction normal. Degenerative spondylolysis noted at C3-4 with resultant mild spinal stenosis. Upper cervical spine otherwise unremarkable. Bone marrow signal  intensity within normal limits. No scalp soft tissue abnormality. Sinuses/Orbits: Globes and orbital soft tissues within normal limits. Patient status post lens extraction bilaterally. Paranasal sinuses are clear. Trace opacity bilateral mastoid air cells, of doubtful significance. Inner ear structures normal. Other: None. MRA HEAD FINDINGS ANTERIOR CIRCULATION: Distal cervical segments of the internal carotid arteries are patent with antegrade flow. Petrous segments widely patent bilaterally. Cavernous and supraclinoid left ICA widely patent. There is moderate diffuse narrowing at the supraclinoid right ICA. ICA termini patent bilaterally. A1 segments patent without high-grade stenosis. Normal anterior communicating artery. There is a focal severe near occlusive stenosis at the proximal left A2 segment (series 7, image 86). Left ACA is patent distally. Right A2 segment patent without stenosis. Multifocal atheromatous irregularity with mild to moderate diffuse narrowing present throughout the M1 segments bilaterally. No proximal M2 occlusion. Distal MCA branches well perfused and fairly symmetric bilaterally, although demonstrate multifocal small vessel atheromatous irregularity. POSTERIOR CIRCULATION: Vertebral arteries are code dominant and patent to the vertebrobasilar junction without flow-limiting stenosis. Posterior inferior cerebral arteries patent proximally. Basilar artery mildly tortuous but widely patent to its distal aspect without stenosis. Superior cerebral arteries patent bilaterally. Both of the posterior cerebral arteries supplied via the basilar artery. Short-segment severe mid left P2 stenosis (series 7, image 83). Atheromatous irregularity throughout the remaining PCAs without additional focal high-grade stenosis. No aneurysm. IMPRESSION: MRI HEAD IMPRESSION 1. Moderate sized acute ischemic nonhemorrhagic left ACA territory infarct involving the parasagittal left frontal lobe. 2. No other acute  intracranial abnormality. 3. Small remote bilateral thalamic lacunar infarcts. MRA HEAD IMPRESSION 1. Negative intracranial MRA for large vessel occlusion. 2. Short-segment severe near occlusive proximal left A2 stenosis. 3. Short-segment severe mid left P2 stenosis. 4. Additional atheromatous irregularity throughout the intracranial circulation as above. No other high-grade or correctable stenosis identified. Electronically Signed   By: Rise Mu M.D.   On: 02/25/2018 23:03   US Carotid Bilateral (at [facility] And [facility] Only)  Result Date: 02/26/2018 CLINICAL DATA:  84 year old male with acute left ACA territory infarct EXAM: BILATERAL CAROTID DUPLEX ULTRASOUND TECHNIQUE: Wallace Cullens scale imaging, color Doppler and duplex ultrasound were performed of bilateral carotid and vertebral arteries in the neck. COMPARISON:  Brain MRI 02/25/2018 FINDINGS: Criteria: Quantification of carotid stenosis is based on velocity parameters that correlate the residual internal carotid diameter with NASCET-based stenosis levels, using the diameter of the distal internal carotid lumen as the denominator for stenosis measurement. The following velocity measurements were obtained: RIGHT ICA: 68/16 cm/sec CCA: 80/12 cm/sec SYSTOLIC ICA/CCA RATIO:  1.1 ECA:  53 cm/sec LEFT ICA: 81/27 cm/sec CCA: 64/10 cm/sec SYSTOLIC ICA/CCA RATIO:  1.0 ECA:  102 cm/sec RIGHT CAROTID ARTERY: Heterogeneous atherosclerotic plaque in the distal common carotid artery extending into the proximal internal carotid artery. By peak systolic velocity criteria, the estimated stenosis remains less than 50%. RIGHT VERTEBRAL ARTERY:  Patent with normal antegrade flow. LEFT CAROTID ARTERY: Mild heterogeneous atherosclerotic plaque in the proximal internal carotid artery. By peak systolic velocity criteria, the estimated stenosis remains less than 50%. LEFT VERTEBRAL ARTERY:  Patent with normal antegrade flow. IMPRESSION: 1. Mild (1-49%) stenosis proximal right internal  carotid artery secondary to focal heterogeneous atherosclerotic plaque. 2. Mild (1-49%) stenosis proximal left internal carotid artery secondary to mild heterogeneous atherosclerotic plaque.  3. Vertebral arteries are patent with normal antegrade flow. Signed, Sterling Big, MD [facility] Electronically Signed   By: Malachy Moan M.D.   On: 02/26/2018 10:48   Mr Maxine Glenn Head/brain DG Cm  Result Date: 02/25/2018 CLINICAL DATA:  Initial evaluation for right-sided weakness, falls. EXAM: MRI HEAD WITHOUT CONTRAST MRA HEAD WITHOUT CONTRAST TECHNIQUE: Multiplanar, multiecho pulse sequences of the brain and surrounding structures were obtained without intravenous contrast. Angiographic images of the head were obtained using MRA technique without contrast. COMPARISON:  Prior CT from 02/23/2018. FINDINGS: MRI HEAD FINDINGS Brain: Age-related cerebral volume loss. Minimal T2/FLAIR hyperintensity within the periventricular and deep white matter both cerebral hemispheres, most consistent with chronic small vessel ischemic changes, felt to be within normal limits for age. Subcentimeter remote bilateral thalamic lacunar infarcts noted. There is moderate-size focus of abnormal restricted diffusion involving the parasagittal left frontal lobe, consistent with acute left ACA territory ischemic infarct (series 100, image 38). Infarct measures approximately 5.8 x 1.3 cm in size, with extension into the central and left genu of the corpus callosum. No associated hemorrhage or mass effect. No other evidence for acute or subacute ischemia. Gray-white matter differentiation otherwise maintained. No other areas of remote cortical infarction. No foci of susceptibility artifact to suggest acute or chronic intracranial hemorrhage. No mass lesion, midline shift or mass effect. Ventricles normal size without hydrocephalus. No extra-axial fluid collection. Major dural sinuses  are grossly patent. Pituitary gland suprasellar region normal. Midline structures intact and normal. Vascular: Major intracranial vascular flow voids are maintained at the skull base. Skull and upper cervical spine: Craniocervical junction normal. Degenerative spondylolysis noted at C3-4 with resultant mild spinal stenosis. Upper cervical spine otherwise unremarkable. Bone marrow signal intensity within normal limits. No scalp soft tissue abnormality. Sinuses/Orbits: Globes and orbital soft tissues within normal limits. Patient status post lens extraction bilaterally. Paranasal sinuses are clear. Trace opacity bilateral mastoid air cells, of doubtful significance. Inner ear structures normal. Other: None. MRA HEAD FINDINGS ANTERIOR CIRCULATION: Distal cervical segments of the internal carotid arteries are patent with antegrade flow. Petrous segments widely patent bilaterally. Cavernous and supraclinoid left ICA widely patent. There is moderate diffuse narrowing at the supraclinoid right ICA. ICA termini patent bilaterally. A1 segments patent without high-grade stenosis. Normal anterior communicating artery. There is a focal severe near occlusive stenosis at the proximal left A2 segment (series 7, image 86). Left ACA is patent distally. Right A2 segment patent without stenosis. Multifocal atheromatous irregularity with mild to moderate diffuse narrowing present throughout the M1 segments bilaterally. No proximal M2 occlusion. Distal MCA branches well perfused and fairly symmetric bilaterally, although demonstrate multifocal small vessel atheromatous irregularity. POSTERIOR CIRCULATION: Vertebral arteries are code dominant and patent to the vertebrobasilar junction without flow-limiting stenosis. Posterior inferior cerebral arteries patent proximally. Basilar artery mildly tortuous but widely patent to its distal aspect without stenosis. Superior cerebral arteries patent bilaterally. Both of the posterior cerebral  arteries supplied via the basilar artery. Short-segment severe mid left P2 stenosis (series 7, image 83). Atheromatous irregularity throughout the remaining PCAs without additional focal high-grade stenosis. No aneurysm. IMPRESSION: MRI HEAD IMPRESSION 1. Moderate sized acute ischemic nonhemorrhagic left ACA territory infarct involving the parasagittal left frontal lobe. 2. No other acute intracranial abnormality. 3. Small remote bilateral thalamic lacunar infarcts. MRA HEAD IMPRESSION 1. Negative intracranial MRA for large vessel occlusion. 2. Short-segment severe near occlusive proximal left A2 stenosis. 3. Short-segment severe mid left P2 stenosis. 4. Additional atheromatous irregularity  throughout the intracranial circulation as above. No other high-grade or correctable stenosis identified. Electronically Signed   By: Rise Mu M.D.   On: 02/25/2018 23:03     Assessment/Plan:  84 y.o. male with hxCOPD, BPH, psoriasis, cataracts, presents with falling events that started on Friday and continue through to today, patient with right upper extremity and lower extremity weakness, was evaluated in the emergency room over the weekend, patient was subsequently discharged home. Pt comes in for further evaluation and he has L ACA stroke on MRI.    - Pt has L ACA stroke - He is likely not compliant with his medications - has to be on ASA and statin - pt/ot - potential d/c tomorrow.   02/26/2018, 11:10 AM

## 2018-02-26 NOTE — Clinical Social Work Placement (Signed)
   CLINICAL SOCIAL WORK PLACEMENT  NOTE  Date:  02/26/2018  Patient Details  Name: LAYDEN FUGETT MRN: [id number] Date of Birth: 1927-08-10  Clinical Social Work is seeking post-discharge placement for this patient at the Skilled  Nursing Facility level of care (*CSW will initial, date and re-position this form in  chart as items are completed):  Yes   Patient/family provided with [place] Clinical Social Work Department's list of facilities offering this level of care within the geographic area requested by the patient (or if unable, by the patient's family).  Yes   Patient/family informed of their freedom to choose among providers that offer the needed level of care, that participate in Medicare, Medicaid or managed care program needed by the patient, have an available bed and are willing to accept the patient.  Yes   Patient/family informed of [place]'s ownership interest in [facility] and [facility], as well as of the fact that they are under no obligation to receive care at these facilities.  PASRR submitted to EDS on 02/26/18     PASRR number received on 02/26/18     Existing PASRR number confirmed on       FL2 transmitted to all facilities in geographic area requested by pt/family on 02/26/18     FL2 transmitted to all facilities within larger geographic area on       Patient informed that his/her managed care company has contracts with or will negotiate with certain facilities, including the following:        Yes   Patient/family informed of bed offers received.  Patient chooses bed at ([facility] )     Physician recommends and patient chooses bed at      Patient to be transferred to   on  .  Patient to be transferred to facility by       Patient family notified on   of transfer.  Name of family member notified:        PHYSICIAN       Additional Comment:    _______________________________________________ Masaye Gatchalian, Darleen Crocker, LCSW 02/26/2018, 3:48  PM

## 2018-02-26 NOTE — Evaluation (Signed)
Clinical/Bedside Swallow Evaluation Patient Details  Name: Allen Potter MRN: [id number] Date of Birth: 05/14/1927  Today's Date: 02/26/2018 Time: SLP Start Time (ACUTE ONLY): 1100 SLP Stop Time (ACUTE ONLY): 1144 SLP Time Calculation (min) (ACUTE ONLY): 44 min  Past Medical History:  Past Medical History:  Diagnosis Date  . COPD (chronic obstructive pulmonary disease) (HCC)   . Restless leg    Past Surgical History: History reviewed. No pertinent surgical history. HPI:  Allen Potter  is a 84 y.o. male with a known history per below which also includes COPD, BPH, psoriasis, cataracts, presents with falling events that started on Friday and continue through to today, patient with right upper extremity and lower extremity weakness, was evaluated in the emergency room over the weekend, patient was subsequently discharged home, patient seen by his primary care provider today and was accepted as a direct admission for acute cerebrovascular accident with right hemiparesis, patient evaluated at the bedside with his wife and family friend, noted personality change as patient is usually the more talkative jovial person, CT head over the weekend was negative for any acute process, examination consistent with dehydration, patient is now being admitted for acute probable ischemic supravascular accident, acute kidney injury with dehydration. Pt's wife reports that pt is much improved from yesterday and that his speech is much better.   Assessment / Plan / Recommendation Clinical Impression  This 84 y/o male presents w/no apparent oropharyngeal dysphagia and mild aspiration risk d/t recent CVA. Pt consumed 2 tsps of ice chips, 10 ounces of thin liquids, 4 ounces of puree, and several bites of solid. No overt s/s of aspiration were observed w/any tested consistency. Vocal quaity remained clear throughout. Oral phase also appeared [facility]. Pt was able to adequately masticate and clear solid consistency. No pocketing  or holding was observed. Oral mech exam revealed mild right lingual weakness, however did not appear to impact swallowing function. Pt remains a mild risk of aspiration d/t recent change in medical status. Recommend Dys III diet w/thin liquids and aspiration precations. Pt demonstrated mildly slurred speech and would benefit from further evaluation of speech/langauge in 1-2 days. Pt/family in agreement.  SLP Visit Diagnosis: Dysphagia, unspecified (R13.10)    Aspiration Risk  Mild aspiration risk    Diet Recommendation Dysphagia 3 (Mech soft);Thin liquid   Liquid Administration via: Cup;Straw Medication Administration: Whole meds with liquid Supervision: Patient able to self feed Compensations: Minimize environmental distractions;Slow rate;Small sips/bites Postural Changes: Seated upright at 90 degrees;Remain upright for at least 30 minutes after po intake    Other  Recommendations Oral Care Recommendations: Oral care BID;Staff/trained caregiver to provide oral care   Follow up Recommendations Other (comment)(TBD)      Frequency and Duration min 2x/week  1 week       Prognosis Prognosis for Safe Diet Advancement: Fair      Swallow Study   General Date of Onset: 02/25/18 HPI: Allen Potter  is a 84 y.o. male with a known history per below which also includes COPD, BPH, psoriasis, cataracts, presents with falling events that started on Friday and continue through to today, patient with right upper extremity and lower extremity weakness, was evaluated in the emergency room over the weekend, patient was subsequently discharged home, patient seen by his primary care provider today and was accepted as a direct admission for acute cerebrovascular accident with right hemiparesis, patient evaluated at the bedside with his wife and family friend, noted personality change as patient is usually the more  talkative jovial person, CT head over the weekend was negative for any acute process, examination  consistent with dehydration, patient is now being admitted for acute probable ischemic supravascular accident, acute kidney injury with dehydration. Pt's wife reports that pt is much improved from yesterday and that his speech is much better. Type of Study: Bedside Swallow Evaluation Previous Swallow Assessment: None reported Diet Prior to this Study: NPO Temperature Spikes Noted: No Respiratory Status: Room air History of Recent Intubation: No Behavior/Cognition: Alert;Cooperative;Pleasant mood Oral Cavity Assessment: Dry Oral Care Completed by SLP: No Oral Cavity - Dentition: Dentures, top;Dentures, bottom Vision: Functional for self-feeding Self-Feeding Abilities: Able to feed self;Needs set up Patient Positioning: Upright in bed Baseline Vocal Quality: Normal Volitional Cough: Strong Volitional Swallow: Able to elicit    Oral/Motor/Sensory Function Overall Oral Motor/Sensory Function: Mild impairment Facial ROM: Within Functional Limits Facial Symmetry: Within Functional Limits Facial Strength: Within Functional Limits Facial Sensation: Within Functional Limits Lingual ROM: Reduced left Lingual Symmetry: Within Functional Limits Lingual Strength: Reduced;Other (Comment)(Reduced right) Lingual Sensation: Within Functional Limits Velum: Within Functional Limits Mandible: Within Functional Limits   Ice Chips Ice chips: Within functional limits Presentation: Spoon   Thin Liquid Thin Liquid: Within functional limits Presentation: Cup;Straw;Self Fed    Nectar Thick Nectar Thick Liquid: Not tested   Honey Thick Honey Thick Liquid: Not tested   Puree Puree: Within functional limits Presentation: Self Fed;Spoon   Solid   GO   Solid: Within functional limits Presentation: Self Fed        [place],Jernie Schutt 02/26/2018,1:29 PM

## 2018-02-26 NOTE — NC FL2 (Addendum)
[place] MEDICAID FL2 LEVEL OF CARE SCREENING TOOL     IDENTIFICATION  Patient Name: Allen Potter Birthdate: [birth year] Sex: male Admission Date (Current Location): 02/25/2018  [place] and [place] Number:  Chiropodist and Address:  [facility], [address] [place], [place] [address]      Provider Number: [id number]  Attending Physician Name and Address:  Shaune Pollack, MD  Relative Name and Phone Number:       Current Level of Care: Hospital Recommended Level of Care: Skilled Nursing Facility Prior Approval Number:    Date Approved/Denied:   PASRR Number: ([id number])  Discharge Plan: SNF    Current Diagnoses: There are no active problems to display for this patient.   Orientation RESPIRATION BLADDER Height & Weight     Self, Place  Normal Continent Weight: 154 lb 9.6 oz (70.1 kg) Height:  5\' 9"  (175.3 cm)  BEHAVIORAL SYMPTOMS/MOOD NEUROLOGICAL BOWEL NUTRITION STATUS      Continent Diet(Dysphagia 3 (Mech soft);Thin liquid )  AMBULATORY STATUS COMMUNICATION OF NEEDS Skin   Extensive Assist Verbally Normal                       Personal Care Assistance Level of Assistance  Bathing, Feeding, Dressing Bathing Assistance: Limited assistance Feeding assistance: Independent Dressing Assistance: Limited assistance     Functional Limitations Info  Sight, Hearing, Speech Sight Info: Adequate Hearing Info: Adequate Speech Info: Adequate    SPECIAL CARE FACTORS FREQUENCY  PT (By licensed PT), OT (By licensed OT)     PT Frequency: (5) OT Frequency: (5)            Contractures      Additional Factors Info  Code Status, Allergies Code Status Info: (DNR ) Allergies Info: (No Known Allergies. )           Current Medications (02/26/2018):  This is the current hospital active medication list Current Facility-Administered Medications  Medication Dose Route Frequency Provider Last Rate Last Dose  . 0.9 %  sodium  chloride infusion   Intravenous Continuous Shaune Pollack, MD 75 mL/hr at 02/26/18 1155    . acetaminophen (TYLENOL) tablet 650 mg  650 mg Oral Q4H PRN Salary, Montell D, MD       Or  . acetaminophen (TYLENOL) solution 650 mg  650 mg Per Tube Q4H PRN Salary, Montell D, MD       Or  . acetaminophen (TYLENOL) suppository 650 mg  650 mg Rectal Q4H PRN Salary, Montell D, MD      . aspirin EC tablet 325 mg  325 mg Oral Daily Salary, Montell D, MD   325 mg at 02/26/18 1244  . heparin injection 5,000 Units  5,000 Units Subcutaneous Q8H Salary, Montell D, MD   5,000 Units at 02/26/18 5621  . hydrALAZINE (APRESOLINE) injection 10 mg  10 mg Intravenous Q4H PRN Salary, Montell D, MD      . ipratropium-albuterol (DUONEB) 0.5-2.5 (3) MG/3ML nebulizer solution 3 mL  3 mL Nebulization Q6H PRN Salary, Montell D, MD      . senna-docusate (Senokot-S) tablet 1 tablet  1 tablet Oral QHS PRN Salary, Montell D, MD      . simvastatin (ZOCOR) tablet 40 mg  40 mg Oral q1800 Salary, Evelena Asa, MD         Discharge Medications: Please see discharge summary for a list of discharge medications.  Relevant Imaging Results:  Relevant Lab Results:   Additional  Information (SSN: [id number])  Charon Smedberg, Darleen Crocker, LCSW

## 2018-02-26 NOTE — Evaluation (Signed)
Occupational Therapy Evaluation Patient Details Name: Allen Potter MRN: [id number] DOB: May 01, 1927 Today's Date: 02/26/2018    History of Present Illness 84 yo male with onset of pleural effusion and dehydration was admitted with mod L ACA hemorrhage and L frontal lobe involvement. Had dehydration with AKI, falls prior to admission. R elbow skin tear from fall. PMHx:  cardiomegaly, strokes, COPD, RLS   Clinical Impression   Pt seen for OT evaluation this date. Prior to hospital admission, pt was independent, mowing their 10 acre land regularly, indep with ADL, driving, and endorses 4-5 falls in past 12 months. Pt lives with his spouse in a single family home. Currently pt A&Ox4 with mild expressive deficits noted which pt/spouse endorse is baseline for pt. Pt demonstrates impairments in balance, BLE weakness bilaterally, impaired safety, and pt reporting feeling RUE and RLE "heaviness". Coordination and sensation intact with testing. No visual deficits noted. Pt required CGA during LB dressing to min assist for LB bathing to maximize safety with noted mild posterior lean which he is able to correct with verbal cue and tactile cue. Pt/family instructed in activity pacing, pursed lip breathing, work simplification, and falls prevention strategies to implement during daily/weekly routines, such as yardwork. Pt would benefit from skilled OT to address noted impairments and functional limitations (see below for any additional details) in order to maximize safety and independence while minimizing falls risk and caregiver burden.  Upon hospital discharge, recommend pt discharge to STR to facilitate eventual safe return home with spouse.    Follow Up Recommendations  Supervision/Assistance - 24 hour;SNF(24/7 supervision/assist for all mobility)    Equipment Recommendations  None recommended by OT    Recommendations for Other Services       Precautions / Restrictions Precautions Precautions:  Fall Precaution Comments: monitor HR Restrictions Weight Bearing Restrictions: No      Mobility Bed Mobility Overal bed mobility: Needs Assistance Bed Mobility: Supine to Sit;Sit to Supine     Supine to sit: Min guard;HOB elevated Sit to supine: Min guard   General bed mobility comments: additional time/effort with CGA and VC to advance RLE over EOB  Transfers Overall transfer level: Needs assistance Equipment used: Rolling walker (2 wheeled) Transfers: Sit to/from Stand Sit to Stand: Min guard;Min assist         General transfer comment: max verbal cues for hand placement     Balance Overall balance assessment: Needs assistance Sitting-balance support: Feet supported;Bilateral upper extremity supported Sitting balance-Leahy Scale: Fair     Standing balance support: Bilateral upper extremity supported;During functional activity Standing balance-Leahy Scale: Poor                             ADL either performed or assessed with clinical judgement   ADL Overall ADL's : Needs assistance/impaired Eating/Feeding: Sitting;Independent   Grooming: Sitting;Independent   Upper Body Bathing: Sitting;Supervision/ safety;Set up   Lower Body Bathing: Sit to/from stand;Minimal assistance;Min guard   Upper Body Dressing : Sitting;Set up;Supervision/safety   Lower Body Dressing: Sit to/from stand;Min guard Lower Body Dressing Details (indicate cue type and reason): pt sat EOB to doff/don sock with slight posterior lean requiring CGA but ultimately able to do without physical assist Toilet Transfer: BSC;Ambulation;RW;Min guard                   Vision Baseline Vision/History: Wears glasses Wears Glasses: At all times Patient Visual Report: No change from baseline Vision Assessment?: No  apparent visual deficits     Perception     Praxis      Pertinent Vitals/Pain Pain Assessment: No/denies pain     Hand Dominance Right   Extremity/Trunk  Assessment Upper Extremity Assessment Upper Extremity Assessment: Overall WFL for tasks assessed RUE Deficits / Details: 4+/5 bilaterally, no focal weakness appreciated, coordination and sensation intact bilaterally   Lower Extremity Assessment Lower Extremity Assessment: Generalized weakness RLE Deficits / Details: grossly 4/5 bilaterally, no focal weakness appreciated, coordination and sensation intact bilaterally   Cervical / Trunk Assessment Cervical / Trunk Assessment: Normal   Communication Communication Communication: Expressive difficulties(pt/spouse report this is not new)   Cognition Arousal/Alertness: Awake/alert Behavior During Therapy: WFL for tasks assessed/performed Overall Cognitive Status: Impaired/Different from baseline Area of Impairment: Problem solving;Safety/judgement                       Following Commands: Follows multi-step commands with increased time Safety/Judgement: Decreased awareness of safety;Decreased awareness of deficits   Problem Solving: Requires verbal cues General Comments: A&Ox4, verbal cues for safety/sequencing during mobility, but follows all commands    General Comments       Exercises Other Exercises Other Exercises: pt/spouse educated in activity pacing, pursed lip breathing, work simplification, and falls prevention strategies to implement during daily routines to maximize safety, minimize future falls risk, and minimize caregiver burden.    Shoulder Instructions      Home Living Family/patient expects to be discharged to:: Private residence Living Arrangements: Spouse/significant other Available Help at Discharge: Family;Available 24 hours/day Type of Home: House       Home Layout: One level         Bathroom Toilet: Standard     Home Equipment: Environmental consultant - 2 wheels;Cane - single point          Prior Functioning/Environment Level of Independence: Independent with assistive device(s)        Comments:  Pt/spouse report use of RW since Friday 4/26 when pt began falling more, 4-5 falls in past 12 months 2:2 RLE weakness, spouse manages medications, pt was driving up until Friday        OT Problem List: Decreased strength;Decreased knowledge of use of DME or AE;Decreased activity tolerance;Impaired balance (sitting and/or standing);Decreased safety awareness      OT Treatment/Interventions: Self-care/ADL training;Balance training;Therapeutic exercise;Therapeutic activities;DME and/or AE instruction;Energy conservation;Patient/family education    OT Goals(Current goals can be found in the care plan section) Acute Rehab OT Goals Patient Stated Goal: to go home OT Goal Formulation: With patient/family Time For Goal Achievement: 03/12/18 Potential to Achieve Goals: Good ADL Goals Pt Will Perform Lower Body Dressing: with supervision;sit to/from stand Pt Will Transfer to Toilet: with supervision;ambulating(BSC over toilet, RW for ambulation)  OT Frequency: Min 1X/week   Barriers to D/C: Decreased caregiver support  spouse unable to provide physical assist       Co-evaluation              AM-PAC PT "6 Clicks" Daily Activity     Outcome Measure Help from another person eating meals?: None Help from another person taking care of personal grooming?: None Help from another person toileting, which includes using toliet, bedpan, or urinal?: A Little Help from another person bathing (including washing, rinsing, drying)?: A Little Help from another person to put on and taking off regular upper body clothing?: A Little Help from another person to put on and taking off regular lower body clothing?: A Little 6 Click  Score: 20   End of Session Equipment Utilized During Treatment: Gait belt;Rolling walker  Activity Tolerance: Patient tolerated treatment well Patient left: in bed;with call bell/phone within reach;with bed alarm set;with family/visitor present  OT Visit Diagnosis: Other  abnormalities of gait and mobility (R26.89);Repeated falls (R29.6);Muscle weakness (generalized) (M62.81)                Time: 1610-9604 OT Time Calculation (min): 26 min Charges:  OT General Charges $OT Visit: 1 Visit OT Evaluation $OT Eval Low Complexity: 1 Low OT Treatments $Self Care/Home Management : 8-22 mins  Richrd Prime, MPH, MS, OTR/L ascom [phone] 02/26/18, 3:08 PM

## 2018-02-26 NOTE — Plan of Care (Signed)
  Problem: Education: Goal: Knowledge of General Education information will improve Outcome: Progressing   Problem: Elimination: Goal: Will not experience complications related to urinary retention Outcome: Progressing   Problem: Pain Managment: Goal: General experience of comfort will improve Outcome: Progressing   Problem: Safety: Goal: Ability to remain free from injury will improve Outcome: Progressing   Problem: Skin Integrity: Goal: Risk for impaired skin integrity will decrease Outcome: Progressing   Problem: Education: Goal: Knowledge of disease or condition will improve Outcome: Progressing   Problem: Ischemic Stroke/TIA Tissue Perfusion: Goal: Complications of ischemic stroke/TIA will be minimized Outcome: Progressing

## 2018-02-26 NOTE — Progress Notes (Signed)
   02/26/18 1530  Clinical Encounter Type  Visited With Patient and family together  Visit Type Follow-up  Referral From Nurse  Consult/Referral To Chaplain  Spiritual Encounters  Spiritual Needs Other (Comment)   CH completed AD with PT. Copy was placed in PT's chart and original was return to PT.

## 2018-02-26 NOTE — Progress Notes (Signed)
Pt was able to be seen with family and requires much help to control RLE once fatigue sets in on gait.  He is not aware of the lagging on RLE and will require cues and assist to focus on it.  Will keep pt on acute therapy and progress as able, plan dc to SNF if able to be accepted to increase control of independent mobility given his PLOF per family.  Family very concerned about his decline in function.      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 Had dehydration with AKI, falls prior to admission. R elbow skin tear from fall. PMHx:  cardiomegaly, strokes, COPD, RLS  Precautions  Precautions Fall (telemetry)  Precaution Comments monitor HR  Restrictions  Weight Bearing Restrictions No  Home Living  Family/patient expects to be discharged to: Private residence  Living Arrangements Spouse/significant other  Available Help at Discharge Family;Available 24 hours/day  Type of Home House  Home Layout One level  Engineer, water - 2 wheels;Cane - single point  Additional Comments Used cane primarily but was out mowing the lawn prior to admission  Prior Function  Level of Independence Independent;Independent with assistive device(s)  Comments occas use of SPC  Communication  Communication No difficulties  Pain Assessment  Pain Assessment No/denies pain  Cognition  Arousal/Alertness Awake/alert  Behavior During Therapy Impulsive  Overall Cognitive Status Impaired/Different from baseline  Area of Impairment Following commands;Awareness;Safety/judgement;Problem solving  Following Commands Follows one step commands with increased time  Safety/Judgement Decreased awareness of safety;Decreased awareness of deficits  Awareness Intellectual  Problem Solving  Slow processing;Requires verbal cues;Requires tactile cues  General Comments family in to note that mobility is very different as well as focus  Upper Extremity Assessment  Upper Extremity Assessment RUE deficits/detail  RUE Deficits / Details weaker grip with pt able to use for advancing walker  Lower Extremity Assessment  Lower Extremity Assessment RLE deficits/detail  RLE Deficits / Details significant weakness that requires cues for advancing walker  Bed Mobility  Overal bed mobility Needs Assistance  Bed Mobility Supine to Sit;Sit to Supine  Supine to sit Min assist;Mod assist  Sit to supine Min assist;Mod assist  General bed mobility comments assisted with his trunk support to sit and then LE's to return to bed  Transfers  Overall transfer level Needs assistance  Equipment used Rolling walker (2 wheeled);1 person hand held assist  Transfers Sit to/from Stand  Sit to Stand Min assist  General transfer comment cues 100% of the time for using hands on bed  Ambulation/Gait  Ambulation/Gait assistance Min assist;Min guard  Ambulation Distance (Feet) 30 Feet  Assistive device Rolling walker (2 wheeled);1 person hand held assist  Gait Pattern/deviations Step-to pattern;Decreased stride length;Wide base of support;Trunk flexed;Drifts right/left (requries assistance of RLE to step through on final steps)  General Gait Details pt fatigued RLE and was too weak to step through but could support on last few steps  Gait velocity reduced  Gait velocity interpretation <1.8 ft/sec, indicate of risk for recurrent falls  Modified Rankin (Stroke Patients Only)  Pre-Morbid Rankin Score 2  Modified Rankin 3  Balance  Overall balance assessment Needs assistance  Sitting-balance support Feet supported;Bilateral upper extremity supported  Sitting balance-Leahy Scale Fair  Standing balance support  Bilateral upper extremity supported;During functional activity  Standing balance-Leahy Scale Poor   Exercises  Exercises Other exercises (has reduced DF ankles due to disuse )  PT - End of Session  Equipment Utilized During Treatment Gait belt  Activity Tolerance Patient limited by fatigue;Treatment limited secondary to medical complications (Comment) (R hemiparesis)  Patient left in bed;with call bell/phone within reach (was being transported to get [place])  Nurse Communication Mobility status  PT Assessment  PT Recommendation/Assessment Patient needs continued PT services  PT Visit Diagnosis Unsteadiness on feet (R26.81);Muscle weakness (generalized) (M62.81);Hemiplegia and hemiparesis  Hemiplegia - Right/Left Right  Hemiplegia - dominant/non-dominant Dominant  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 1  Difficulty moving from lying on back to sitting on the side of the bed?  1  Difficulty sitting down on and standing up from a chair with arms (e.g., wheelchair, bedside commode, etc,.)? 1  Help needed moving to and from a bed to chair (including a wheelchair)? 3  Help needed walking in hospital room? 2  Help needed climbing 3-5 steps with a railing?  2  6 Click Score 10  Mobility G Code  CL  PT Recommendation  Follow Up Recommendations SNF  PT equipment Rolling walker with 5" wheels  Acute Rehab PT Goals  Patient Stated Goal to walk and get home  PT Goal Formulation With patient/family  Time For Goal Achievement 03/12/18  Potential to Achieve Goals  Good  PT Time Calculation  PT Start Time (ACUTE ONLY) 0850  PT Stop Time (ACUTE ONLY) 0912  PT Time Calculation (min) (ACUTE ONLY) 22 min  PT G-Codes **NOT FOR INPATIENT CLASS**  Functional Assessment Tool Used AM-PAC 6 Clicks Basic Mobility  PT General Charges  $$ ACUTE PT VISIT 1 Visit  PT Evaluation  $PT Eval Moderate Complexity 1 Mod  Written Expression  Dominant Hand Right    Samul Dada, PT MS Acute Rehab Dept. Number: [facility] [id number] and [facility] [phone]

## 2018-02-26 NOTE — Clinical Social Work Note (Addendum)
Clinical Social Work Assessment  Patient Details  Name: Allen Potter MRN: [id number] Date of Birth: 1927/02/23  Date of referral:  02/26/18               Reason for consult:  Facility Placement, Discharge Planning                Permission sought to share information with:  Chartered certified accountant granted to share information::  Yes, Verbal Permission Granted  Name::      Allen Potter::   Allen Potter  Relationship::     Contact Information:     Housing/Transportation Living arrangements for the past 2 months:  Allen Potter of Information:  Spouse Patient Interpreter Needed:  None Criminal Activity/Legal Involvement Pertinent to Current Situation/Hospitalization:  No - Comment as needed Significant Relationships:  Spouse, Neighbor Lives with:  Spouse Do you feel safe going back to the place where you live?  Yes Need for family participation in patient care:  Yes (Comment)  Care giving concerns: Patient lives in [name] with his wife Allen Potter (home: [phone]).   Social Worker assessment / plan: Holiday representative (CSW) received SNF consult. PT is recommending SNF. Social work Theatre manager met with patient, patient's wife Allen Potter, and Allen Potter at bedside. Patient was laying in bed, alert and oriented to self and place only. Patient's wife participated in assessment on patient's behalf. Social work Theatre manager introduced self and explained the role of the [place]. Per Allen Potter, patient lives in [name]. Social work Theatre manager explained that PT is recommending SNF and that Medicare requires a qualifying 3 night inpatient stay in the hospital in order to approve SNF placement. Patient was admitted inpatient on 02/25/18. Patient and patient's wife verbalized their understanding and are agreeable to SNF placement. Patient prefers [facility]. FL2 completed and faxed out.  Social work Theatre manager presented bed offers to patient and his wife.  [facility] chose [facility]. Allen Potter, [facility] liaison, is aware of accepted bed offer. CSW and social work Theatre manager will continue to follow up and assist.  Employment status:  Retired Forensic scientist:    PT Recommendations:  [name] / Referral to community resources:  [name]  Patient/Family's Response to care: Patient and Patient's wife are agreeable to SNF placement and chose [facility].  Patient/Family's Understanding of and Emotional Response to Diagnosis, Current Treatment, and Prognosis: Patient and patient's wife were both pleasant and thanked social work Theatre manager for her assistance.  Emotional Assessment Appearance:  Appears stated age Attitude/Demeanor/Rapport:    Affect (typically observed):  Accepting, Calm, Pleasant Orientation:  Oriented to Self, Oriented to Place Alcohol / Substance use:  Not Applicable Psych involvement (Current and /or in the community):  No (Comment)  Discharge Needs  Concerns to be addressed:  Care Coordination, Discharge Planning Concerns Readmission within the last 30 days:  No Current discharge risk:  Dependent with Mobility Barriers to Discharge:  Continued Medical Work up   Allen Potter, Student-Social Work 02/26/2018, 3:37 PM

## 2018-02-27 ENCOUNTER — Inpatient Hospital Stay
Admission: AD | Admit: 2018-02-27 | Discharge: 2018-02-27 | Disposition: A | Discharge status: Home / Self Care | Payer: Medicare Other | Source: Ambulatory Visit | Attending: Family Medicine | Admitting: Family Medicine

## 2018-02-27 DIAGNOSIS — I639 Cerebral infarction, unspecified: Secondary | ICD-10-CM

## 2018-02-27 LAB — ECHOCARDIOGRAM COMPLETE
Height: 69 in
Weight: 2473.6 oz

## 2018-02-27 MED ORDER — ASPIRIN EC 81 MG PO TBEC
81.0000 mg | DELAYED_RELEASE_TABLET | Freq: Every day | ORAL | Status: DC
  Administered 2018-02-27 – 2018-02-28 (×2): 81 mg via ORAL
  Filled 2018-02-27 (×2): qty 1

## 2018-02-27 MED ORDER — PERFLUTREN LIPID MICROSPHERE
1.0000 mL | INTRAVENOUS | Status: AC | PRN
  Administered 2018-02-27: 2 mL via INTRAVENOUS
  Filled 2018-02-27: qty 10

## 2018-02-27 MED ORDER — CLOPIDOGREL BISULFATE 75 MG PO TABS
75.0000 mg | ORAL_TABLET | Freq: Every day | ORAL | Status: DC
  Administered 2018-02-27 – 2018-02-28 (×2): 75 mg via ORAL
  Filled 2018-02-27 (×2): qty 1

## 2018-02-27 NOTE — Progress Notes (Signed)
[facility] [place] [facility]   PATIENT NAME: Allen Potter    MR#:  [id number]  DATE OF BIRTH:  11-26-1926  SUBJECTIVE:  CHIEF COMPLAINT:  No chief complaint on file.  Better slurred speech and right-sided weakness. More alert today, sitting in chair and eating.  REVIEW OF SYSTEMS:  Review of Systems  Constitutional: Negative for chills, fever and malaise/fatigue.  HENT: Negative for sore throat.   Eyes: Negative for blurred vision and double vision.  Respiratory: Negative for cough, hemoptysis, shortness of breath, wheezing and stridor.   Cardiovascular: Negative for chest pain, palpitations, orthopnea and leg swelling.  Gastrointestinal: Negative for abdominal pain, blood in stool, diarrhea, melena, nausea and vomiting.  Genitourinary: Negative for dysuria, flank pain and hematuria.  Musculoskeletal: Negative for back pain and joint pain.  Skin: Negative for rash.  Neurological: Positive for speech change and focal weakness. Negative for dizziness, sensory change, seizures, loss of consciousness, weakness and headaches.  Endo/Heme/Allergies: Negative for polydipsia.  Psychiatric/Behavioral: Negative for depression. The patient is not nervous/anxious.     DRUG ALLERGIES:  No Known Allergies VITALS:  Blood pressure 134/73, pulse 87, temperature 97.7 F (36.5 C), temperature source Oral, resp. rate 18, height 5\' 9"  (1.753 m), weight 70.1 kg (154 lb 9.6 oz), SpO2 95 %. PHYSICAL EXAMINATION:  Physical Exam  Constitutional: He is oriented to person, place, and time.  HENT:  Head: Normocephalic.  Mouth/Throat: Oropharynx is clear and moist.  Eyes: Pupils are equal, round, and reactive to light. Conjunctivae and EOM are normal. No scleral icterus.  Neck: Normal range of motion. Neck supple. No JVD present. No tracheal deviation present.  Cardiovascular: Normal rate, regular rhythm and normal heart sounds. Exam reveals no gallop.  No murmur  heard. Pulmonary/Chest: Effort normal and breath sounds normal. No respiratory distress. He has no wheezes. He has no rales.  Abdominal: Soft. Bowel sounds are normal. He exhibits no distension. There is no tenderness. There is no rebound.  Musculoskeletal: Normal range of motion. He exhibits no edema or tenderness.  Neurological: He is alert and oriented to person, place, and time.  Mild slurred speech and right sided weakness 4/5.  Skin: No rash noted. No erythema.  Psychiatric: He has a normal mood and affect.   LABORATORY PANEL:  Male CBC Recent Labs  Lab 02/25/18 1821  WBC 9.7  HGB 12.3*  HCT 36.7*  PLT 265   ------------------------------------------------------------------------------------------------------------------ Chemistries  Recent Labs  Lab 02/25/18 1821 02/26/18 0415  NA 138 137  K 4.7 4.1  CL 101 103  CO2 29 25  GLUCOSE 98 78  BUN 35* 33*  CREATININE 1.13 1.00  CALCIUM 8.9 8.4*  AST 30  --   ALT 16*  --   ALKPHOS 70  --   BILITOT 0.5  --    RADIOLOGY:  No results found. ASSESSMENT AND PLAN:   *Acute ischemic cerebral vascular accident  Continue aspirin, statin therapy,  Acute CVA per MRI of the brain,  Reviewed carotid Dopplers, echocardiogram, He passed speech study.  Aspiration/fall precaution Physical therapy suggested SNF.  Added plavix as per neurology.  *Acute probable acute kidney injury with dehydration Improving with IV fluid support.  *COPD without exacerbation Stable Breathing treatments as needed  *Acute frequent falls Most likely secondary to above plan of care stated above  PT suggested SNF, as per his insurance need 3 nights for placement, d/c tomorrow to rehab.  All the records are reviewed and case discussed with Care  Management/Social Worker. Management plans discussed with the patient, his wife and they are in agreement.  CODE STATUS: DNR  TOTAL TIME TAKING CARE OF THIS PATIENT: 33 minutes.   More than 50% of  the time was spent in counseling/coordination of care: YES  POSSIBLE D/C IN 1- 2DAYS, DEPENDING ON CLINICAL CONDITION.   Altamese Dilling M.D on 02/27/2018 at 9:14 PM  Between 7am to 6pm - Pager - [phone]  After 6pm go to [email or web address] - Therapist, nutritional Hospitalists

## 2018-02-27 NOTE — Progress Notes (Signed)
Subjective: Patient awake and alert.  Per wife improved from initial presentation.    Objective: Current vital signs: BP 136/74   Pulse 79   Temp 97.6 F (36.4 C) (Oral)   Resp 20   Ht 5\' 9"  (1.753 m)   Wt 70.1 kg (154 lb 9.6 oz)   SpO2 94%   BMI 22.83 kg/m  Vital signs in last 24 hours: Temp:  [97.5 F (36.4 C)-98 F (36.7 C)] 97.6 F (36.4 C) (05/01 0809) Pulse Rate:  [31-97] 79 (05/01 0809) Resp:  [16-20] 20 (05/01 0809) BP: (125-141)/(69-88) 136/74 (05/01 0809) SpO2:  [84 %-99 %] 94 % (05/01 0809)  Intake/Output from previous day: 04/30 0701 - 05/01 0700 In: 2207.1 [I.V.:2207.1] Out: 725 [Urine:725] Intake/Output this shift: Total I/O In: 480 [P.O.:480] Out: -  Nutritional status:  Diet Order           DIET DYS 3 Room service appropriate? Yes; Fluid consistency: Thin  Diet effective now          Neurologic Exam: Mental Status: Alert and awake.  Dysarthric.  Follows commands.   Cranial Nerves: II: Discs flat bilaterally; Visual fields grossly normal, pupils equal, round, reactive to light and accommodation III,IV, VI: ptosis not present, extra-ocular motions intact bilaterally V,VII: smile symmetric, facial light touch sensation normal bilaterally VIII: hearing normal bilaterally IX,X: gag reflex present XI: bilateral shoulder shrug XII: midline tongue extension Motor: Right :  Upper extremity   5-/5                                                Left:     Upper extremity   5/5             Lower extremity   4/5                                                  Lower extremity   5/5 Tone and bulk:normal tone throughout; no atrophy noted Sensory: Pinprick and light touch intact throughout, bilaterally  Lab Results: Basic Metabolic Panel: Recent Labs  Lab 02/23/18 1404 02/25/18 1821 02/26/18 0415  NA 135 138 137  K 4.4 4.7 4.1  CL 102 101 103  CO2 27 29 25   GLUCOSE 105* 98 78  BUN 25* 35* 33*  CREATININE 1.04 1.13 1.00  CALCIUM 8.5* 8.9 8.4*     Liver Function Tests: Recent Labs  Lab 02/23/18 1404 02/25/18 1821  AST 23 30  ALT 10* 16*  ALKPHOS 65 70  BILITOT 0.5 0.5  PROT 7.9 8.4*  ALBUMIN 3.0* 3.1*   No results for input(s): LIPASE, AMYLASE in the last 168 hours. No results for input(s): AMMONIA in the last 168 hours.  CBC: Recent Labs  Lab 02/23/18 1404 02/25/18 1821  WBC 8.7 9.7  HGB 11.5* 12.3*  HCT 34.7* 36.7*  MCV 83.7 84.5  PLT 276 265    Cardiac Enzymes: Recent Labs  Lab 02/23/18 1404  TROPONINI <0.03    Lipid Panel: Recent Labs  Lab 02/26/18 0415  CHOL 137  TRIG 76  HDL 47  CHOLHDL 2.9  VLDL 15  LDLCALC 75    CBG: No results for input(s): GLUCAP in the last 168 hours.  Microbiology: No results found for this or any previous visit.  Coagulation Studies: Recent Labs    02/25/18 1821  LABPROT 15.0  INR 1.19    Imaging: Dg Chest 2 View  Result Date: 02/26/2018 CLINICAL DATA:  CVA, weakness EXAM: CHEST - 2 VIEW COMPARISON:  02/23/2018 FINDINGS: No significant pleural effusion. Stable enlarged cardiomediastinal silhouette with mild central congestion. Mild diffuse interstitial opacity, likely chronic change. Patchy atelectasis or scar at the left base. No pneumothorax. IMPRESSION: 1. Mild cardiomegaly with minimal central vascular congestion 2. Tiny pleural effusion or thickening. Mild diffuse interstitial prominence, likely chronic interstitial change. Patchy atelectasis or scarring at the left lung base. Electronically Signed   By: Jasmine Pang M.D.   On: 02/26/2018 01:50   Mr Brain Wo Contrast  Result Date: 02/25/2018 CLINICAL DATA:  Initial evaluation for right-sided weakness, falls. EXAM: MRI HEAD WITHOUT CONTRAST MRA HEAD WITHOUT CONTRAST TECHNIQUE: Multiplanar, multiecho pulse sequences of the brain and surrounding structures were obtained without intravenous contrast. Angiographic images of the head were obtained using MRA technique without contrast. COMPARISON:  Prior CT  from 02/23/2018. FINDINGS: MRI HEAD FINDINGS Brain: Age-related cerebral volume loss. Minimal T2/FLAIR hyperintensity within the periventricular and deep white matter both cerebral hemispheres, most consistent with chronic small vessel ischemic changes, felt to be within normal limits for age. Subcentimeter remote bilateral thalamic lacunar infarcts noted. There is moderate-size focus of abnormal restricted diffusion involving the parasagittal left frontal lobe, consistent with acute left ACA territory ischemic infarct (series 100, image 38). Infarct measures approximately 5.8 x 1.3 cm in size, with extension into the central and left genu of the corpus callosum. No associated hemorrhage or mass effect. No other evidence for acute or subacute ischemia. Gray-white matter differentiation otherwise maintained. No other areas of remote cortical infarction. No foci of susceptibility artifact to suggest acute or chronic intracranial hemorrhage. No mass lesion, midline shift or mass effect. Ventricles normal size without hydrocephalus. No extra-axial fluid collection. Major dural sinuses are grossly patent. Pituitary gland suprasellar region normal. Midline structures intact and normal. Vascular: Major intracranial vascular flow voids are maintained at the skull base. Skull and upper cervical spine: Craniocervical junction normal. Degenerative spondylolysis noted at C3-4 with resultant mild spinal stenosis. Upper cervical spine otherwise unremarkable. Bone marrow signal intensity within normal limits. No scalp soft tissue abnormality. Sinuses/Orbits: Globes and orbital soft tissues within normal limits. Patient status post lens extraction bilaterally. Paranasal sinuses are clear. Trace opacity bilateral mastoid air cells, of doubtful significance. Inner ear structures normal. Other: None. MRA HEAD FINDINGS ANTERIOR CIRCULATION: Distal cervical segments of the internal carotid arteries are patent with antegrade flow.  Petrous segments widely patent bilaterally. Cavernous and supraclinoid left ICA widely patent. There is moderate diffuse narrowing at the supraclinoid right ICA. ICA termini patent bilaterally. A1 segments patent without high-grade stenosis. Normal anterior communicating artery. There is a focal severe near occlusive stenosis at the proximal left A2 segment (series 7, image 86). Left ACA is patent distally. Right A2 segment patent without stenosis. Multifocal atheromatous irregularity with mild to moderate diffuse narrowing present throughout the M1 segments bilaterally. No proximal M2 occlusion. Distal MCA branches well perfused and fairly symmetric bilaterally, although demonstrate multifocal small vessel atheromatous irregularity. POSTERIOR CIRCULATION: Vertebral arteries are code dominant and patent to the vertebrobasilar junction without flow-limiting stenosis. Posterior inferior cerebral arteries patent proximally. Basilar artery mildly tortuous but widely patent to its distal aspect without stenosis. Superior cerebral arteries patent bilaterally. Both of the posterior cerebral arteries supplied  via the basilar artery. Short-segment severe mid left P2 stenosis (series 7, image 83). Atheromatous irregularity throughout the remaining PCAs without additional focal high-grade stenosis. No aneurysm. IMPRESSION: MRI HEAD IMPRESSION 1. Moderate sized acute ischemic nonhemorrhagic left ACA territory infarct involving the parasagittal left frontal lobe. 2. No other acute intracranial abnormality. 3. Small remote bilateral thalamic lacunar infarcts. MRA HEAD IMPRESSION 1. Negative intracranial MRA for large vessel occlusion. 2. Short-segment severe near occlusive proximal left A2 stenosis. 3. Short-segment severe mid left P2 stenosis. 4. Additional atheromatous irregularity throughout the intracranial circulation as above. No other high-grade or correctable stenosis identified. Electronically Signed   By: Rise Mu M.D.   On: 02/25/2018 23:03   US Carotid Bilateral (at [facility] Only)  Result Date: 02/26/2018 CLINICAL DATA:  84 year old male with acute left ACA territory infarct EXAM: BILATERAL CAROTID DUPLEX ULTRASOUND TECHNIQUE: Wallace Cullens scale imaging, color Doppler and duplex ultrasound were performed of bilateral carotid and vertebral arteries in the neck. COMPARISON:  Brain MRI 02/25/2018 FINDINGS: Criteria: Quantification of carotid stenosis is based on velocity parameters that correlate the residual internal carotid diameter with NASCET-based stenosis levels, using the diameter of the distal internal carotid lumen as the denominator for stenosis measurement. The following velocity measurements were obtained: RIGHT ICA: 68/16 cm/sec CCA: 80/12 cm/sec SYSTOLIC ICA/CCA RATIO:  1.1 ECA:  53 cm/sec LEFT ICA: 81/27 cm/sec CCA: 64/10 cm/sec SYSTOLIC ICA/CCA RATIO:  1.0 ECA:  102 cm/sec RIGHT CAROTID ARTERY: Heterogeneous atherosclerotic plaque in the distal common carotid artery extending into the proximal internal carotid artery. By peak systolic velocity criteria, the estimated stenosis remains less than 50%. RIGHT VERTEBRAL ARTERY:  Patent with normal antegrade flow. LEFT CAROTID ARTERY: Mild heterogeneous atherosclerotic plaque in the proximal internal carotid artery. By peak systolic velocity criteria, the estimated stenosis remains less than 50%. LEFT VERTEBRAL ARTERY:  Patent with normal antegrade flow. IMPRESSION: 1. Mild (1-49%) stenosis proximal right internal carotid artery secondary to focal heterogeneous atherosclerotic plaque. 2. Mild (1-49%) stenosis proximal left internal carotid artery secondary to mild heterogeneous atherosclerotic plaque. 3. Vertebral arteries are patent with normal antegrade flow. Signed, Sterling Big, MD [facility]iology Electronically Signed   By: Malachy Moan M.D.   On: 02/26/2018 10:48   Mr Maxine Glenn Head/brain  ZO Cm  Result Date: 02/25/2018 CLINICAL DATA:  Initial evaluation for right-sided weakness, falls. EXAM: MRI HEAD WITHOUT CONTRAST MRA HEAD WITHOUT CONTRAST TECHNIQUE: Multiplanar, multiecho pulse sequences of the brain and surrounding structures were obtained without intravenous contrast. Angiographic images of the head were obtained using MRA technique without contrast. COMPARISON:  Prior CT from 02/23/2018. FINDINGS: MRI HEAD FINDINGS Brain: Age-related cerebral volume loss. Minimal T2/FLAIR hyperintensity within the periventricular and deep white matter both cerebral hemispheres, most consistent with chronic small vessel ischemic changes, felt to be within normal limits for age. Subcentimeter remote bilateral thalamic lacunar infarcts noted. There is moderate-size focus of abnormal restricted diffusion involving the parasagittal left frontal lobe, consistent with acute left ACA territory ischemic infarct (series 100, image 38). Infarct measures approximately 5.8 x 1.3 cm in size, with extension into the central and left genu of the corpus callosum. No associated hemorrhage or mass effect. No other evidence for acute or subacute ischemia. Gray-white matter differentiation otherwise maintained. No other areas of remote cortical infarction. No foci of susceptibility artifact to suggest acute or chronic intracranial hemorrhage. No mass lesion, midline shift or mass effect. Ventricles normal size without hydrocephalus. No extra-axial fluid collection.  Major dural sinuses are grossly patent. Pituitary gland suprasellar region normal. Midline structures intact and normal. Vascular: Major intracranial vascular flow voids are maintained at the skull base. Skull and upper cervical spine: Craniocervical junction normal. Degenerative spondylolysis noted at C3-4 with resultant mild spinal stenosis. Upper cervical spine otherwise unremarkable. Bone marrow signal intensity within normal limits. No scalp soft tissue  abnormality. Sinuses/Orbits: Globes and orbital soft tissues within normal limits. Patient status post lens extraction bilaterally. Paranasal sinuses are clear. Trace opacity bilateral mastoid air cells, of doubtful significance. Inner ear structures normal. Other: None. MRA HEAD FINDINGS ANTERIOR CIRCULATION: Distal cervical segments of the internal carotid arteries are patent with antegrade flow. Petrous segments widely patent bilaterally. Cavernous and supraclinoid left ICA widely patent. There is moderate diffuse narrowing at the supraclinoid right ICA. ICA termini patent bilaterally. A1 segments patent without high-grade stenosis. Normal anterior communicating artery. There is a focal severe near occlusive stenosis at the proximal left A2 segment (series 7, image 86). Left ACA is patent distally. Right A2 segment patent without stenosis. Multifocal atheromatous irregularity with mild to moderate diffuse narrowing present throughout the M1 segments bilaterally. No proximal M2 occlusion. Distal MCA branches well perfused and fairly symmetric bilaterally, although demonstrate multifocal small vessel atheromatous irregularity. POSTERIOR CIRCULATION: Vertebral arteries are code dominant and patent to the vertebrobasilar junction without flow-limiting stenosis. Posterior inferior cerebral arteries patent proximally. Basilar artery mildly tortuous but widely patent to its distal aspect without stenosis. Superior cerebral arteries patent bilaterally. Both of the posterior cerebral arteries supplied via the basilar artery. Short-segment severe mid left P2 stenosis (series 7, image 83). Atheromatous irregularity throughout the remaining PCAs without additional focal high-grade stenosis. No aneurysm. IMPRESSION: MRI HEAD IMPRESSION 1. Moderate sized acute ischemic nonhemorrhagic left ACA territory infarct involving the parasagittal left frontal lobe. 2. No other acute intracranial abnormality. 3. Small remote bilateral  thalamic lacunar infarcts. MRA HEAD IMPRESSION 1. Negative intracranial MRA for large vessel occlusion. 2. Short-segment severe near occlusive proximal left A2 stenosis. 3. Short-segment severe mid left P2 stenosis. 4. Additional atheromatous irregularity throughout the intracranial circulation as above. No other high-grade or correctable stenosis identified. Electronically Signed   By: Rise Mu M.D.   On: 02/25/2018 23:03    Medications:  I have reviewed the patient's current medications. Scheduled: . aspirin EC  325 mg Oral Daily  . heparin  5,000 Units Subcutaneous Q8H  . simvastatin  40 mg Oral q1800    Assessment/Plan: Patient improved today.  Continued RLE weakness.  Wife reports compliance with medications.  MRI shows acute left frontal infarct.  MRA shows short segment severe left A2 and P2 stenosis.  Carotid dopplers show no evidence of hemodynamically significant stenosis.  Echocardiogram pending.  A1c 5.9, LDL 75.  Recommendations: 1. ASA 81mg  and Plavix 75mg  daily 2. Continue statin   LOS: 2 days   Thana Farr, MD Neurology [phone] 02/27/2018  12:41 PM

## 2018-02-27 NOTE — Progress Notes (Signed)
Physical Therapy Treatment Patient Details Name: Allen Potter MRN: [id number] DOB: 1927-07-15 Today's Date: 02/27/2018    History of Present Illness 84 yo male with onset of pleural effusion and dehydration was admitted with mod L ACA hemorrhage and L frontal lobe involvement. Had dehydration with AKI, falls prior to admission. R elbow skin tear from fall. PMHx:  cardiomegaly, strokes, COPD, RLS    PT Comments    Pt agreeable to PT. Pt requires Min a for bed mobility, STS transfers and gait demonstrating mild posterior lean throughout all mobility. Increased verbal and tactile cueing for corrections. Demonstrates poor safety with transfers and with rolling walker use (feet outside of rolling walker parameters/difficulty with turns). Pt received up in chair comfortably. Overall progressing ambulation/function. Continue PT to progress strength, endurance, balance, safety to improve all functional mobility toward baseline.    Follow Up Recommendations        Equipment Recommendations  Rolling walker with 5" wheels    Recommendations for Other Services       Precautions / Restrictions Precautions Precautions: Fall Restrictions Weight Bearing Restrictions: No    Mobility  Bed Mobility Overal bed mobility: Needs Assistance Bed Mobility: Supine to Sit     Supine to sit: Min assist     General bed mobility comments: assist for trunk  Transfers Overall transfer level: Needs assistance Equipment used: Rolling walker (2 wheeled) Transfers: Sit to/from Stand Sit to Stand: Min assist         General transfer comment: cues for hand placement; mild unsteadiness. Mild posterior lean  Ambulation/Gait Ambulation/Gait assistance: Min assist Ambulation Distance (Feet): 40 Feet Assistive device: Rolling walker (2 wheeled);1 person hand held assist Gait Pattern/deviations: Step-through pattern;Decreased stride length     General Gait Details: Mild unsteadiness and more so with  turns; posterior lean occasionally   Stairs             Wheelchair Mobility    Modified Rankin (Stroke Patients Only)       Balance Overall balance assessment: Needs assistance Sitting-balance support: Feet supported;Bilateral upper extremity supported Sitting balance-Leahy Scale: Fair     Standing balance support: Bilateral upper extremity supported Standing balance-Leahy Scale: Fair(Fair -)                              Cognition Arousal/Alertness: Awake/alert Behavior During Therapy: WFL for tasks assessed/performed Overall Cognitive Status: Within Functional Limits for tasks assessed                                        Exercises      General Comments        Pertinent Vitals/Pain Pain Assessment: No/denies pain    Home Living                      Prior Function            PT Goals (current goals can now be found in the care plan section) Progress towards PT goals: Progressing toward goals    Frequency    7X/week      PT Plan Current plan remains appropriate    Co-evaluation              AM-PAC PT "6 Clicks" Daily Activity  Outcome Measure  Difficulty turning over in bed (including adjusting bedclothes, sheets and  blankets)?: Unable Difficulty moving from lying on back to sitting on the side of the bed? : Unable Difficulty sitting down on and standing up from a chair with arms (e.g., wheelchair, bedside commode, etc,.)?: Unable Help needed moving to and from a bed to chair (including a wheelchair)?: A Little Help needed walking in hospital room?: A Little Help needed climbing 3-5 steps with a railing? : A Lot 6 Click Score: 11    End of Session Equipment Utilized During Treatment: Gait belt Activity Tolerance: Patient tolerated treatment well Patient left: in chair;with call bell/phone within reach;with family/visitor present;Other (comment)(nursing assistant called to obtain alarm)   PT  Visit Diagnosis: Unsteadiness on feet (R26.81);Muscle weakness (generalized) (M62.81);Hemiplegia and hemiparesis Hemiplegia - Right/Left: Right Hemiplegia - dominant/non-dominant: Dominant Hemiplegia - caused by: Nontraumatic intracerebral hemorrhage     Time: 1610-9604 PT Time Calculation (min) (ACUTE ONLY): 27 min  Charges:  $Gait Training: 8-22 mins $Therapeutic Activity: 8-22 mins                    G Codes:        Scot Dock, PTA 02/27/2018, 1:05 PM

## 2018-02-27 NOTE — Progress Notes (Signed)
*  PRELIMINARY RESULTS* Echocardiogram 2D Echocardiogram has been performed.  Joanette Gula Daemien Fronczak 02/27/2018, 11:23 AM

## 2018-02-27 NOTE — Progress Notes (Signed)
Plan is for patient to D/C to [facility] tomorrow pending medical clearance. Tammy admissions coordinator at [facility] is aware of above.   [employer], LCSW [phone]

## 2018-02-27 NOTE — Progress Notes (Signed)
SLP Cancellation Note  Patient Details Name: Allen Potter MRN: [id number] DOB: August 09, 1927   Cancelled treatment:       Reason Eval/Treat Not Completed: SLP screened, no needs identified, will sign off(chart reviewed; consulted MD/NSG then met w/ pt/wife). Upon speaking w/ MD and [facility] staff, all reported pt is verbally conversive and is 100% intelligible to verbalize his wants/needs. Wife indicated he was talking w/ her "really at his baseline". Noted slight decreased tone in left upper lip line, but this did not appear to impact his speech overall. MD agreed. Wife and pt denied needing Speech Therapy at this time. Wife and pt denied any swallowing difficulties w/ meals today; NSG agreed and stated he swallowed his pills adequately.  General oral motor exercises given to pt/wife; recommendation to f/u w/ PCP or MD for any further referral for an evaluation if they feel it's needed once returning home. Pt/wife agreed. MD agreed.    Orinda Kenner, MS, CCC-SLP Watson,Katherine 02/27/2018, 2:51 PM

## 2018-02-27 NOTE — Plan of Care (Signed)
  Problem: Education: Goal: Knowledge of General Education information will improve Outcome: Progressing   Problem: Health Behavior/Discharge Planning: Goal: Ability to manage health-related needs will improve Outcome: Progressing   Problem: Clinical Measurements: Goal: Ability to maintain clinical measurements within normal limits will improve Outcome: Progressing Goal: Will remain free from infection Outcome: Progressing Goal: Diagnostic test results will improve Outcome: Progressing Goal: Respiratory complications will improve Outcome: Progressing Goal: Cardiovascular complication will be avoided Outcome: Progressing   Problem: Activity: Goal: Risk for activity intolerance will decrease Outcome: Progressing   Problem: Nutrition: Goal: Adequate nutrition will be maintained Outcome: Progressing   Problem: Coping: Goal: Level of anxiety will decrease Outcome: Progressing   Problem: Elimination: Goal: Will not experience complications related to bowel motility Outcome: Progressing Goal: Will not experience complications related to urinary retention Outcome: Progressing   Problem: Pain Managment: Goal: General experience of comfort will improve Outcome: Progressing   Problem: Safety: Goal: Ability to remain free from injury will improve Outcome: Progressing   Problem: Skin Integrity: Goal: Risk for impaired skin integrity will decrease Outcome: Progressing   Problem: Spiritual Needs Goal: Ability to function at adequate level Outcome: Progressing   Problem: Education: Goal: Knowledge of disease or condition will improve Outcome: Progressing Goal: Knowledge of secondary prevention will improve Outcome: Progressing Goal: Knowledge of patient specific risk factors addressed and post discharge goals established will improve Outcome: Progressing   Problem: Coping: Goal: Will verbalize positive feelings about self Outcome: Progressing Goal: Will identify  appropriate support needs Outcome: Progressing   Problem: Health Behavior/Discharge Planning: Goal: Ability to manage health-related needs will improve Outcome: Progressing   Problem: Self-Care: Goal: Ability to participate in self-care as condition permits will improve Outcome: Progressing Goal: Ability to communicate needs accurately will improve Outcome: Progressing   Problem: Nutrition: Goal: Risk of aspiration will decrease Outcome: Progressing Goal: Dietary intake will improve Outcome: Progressing   Problem: Ischemic Stroke/TIA Tissue Perfusion: Goal: Complications of ischemic stroke/TIA will be minimized Outcome: Progressing

## 2018-02-28 MED ORDER — SENNOSIDES-DOCUSATE SODIUM 8.6-50 MG PO TABS: 1.0000 | ORAL_TABLET | Freq: Every evening | ORAL | 0 refills | Status: DC | PRN

## 2018-02-28 MED ORDER — CLOPIDOGREL BISULFATE 75 MG PO TABS: 75.0000 mg | ORAL_TABLET | Freq: Every day | ORAL | 1 refills | Status: DC

## 2018-02-28 MED ORDER — ASPIRIN 81 MG PO TBEC: 81.0000 mg | DELAYED_RELEASE_TABLET | Freq: Every day | ORAL | 1 refills | Status: DC

## 2018-02-28 MED ORDER — SIMVASTATIN 40 MG PO TABS: 40.0000 mg | ORAL_TABLET | Freq: Every day | ORAL | 1 refills | Status: DC

## 2018-02-28 NOTE — Clinical Social Work Placement (Signed)
   CLINICAL SOCIAL WORK PLACEMENT  NOTE  Date:  02/28/2018  Patient Details  Name: Allen Potter MRN: [id number] Date of Birth: [birth year]  Clinical Social Work is seeking post-discharge placement for this patient at the Skilled  Nursing Facility level of care (*CSW will initial, date and re-position this form in  chart as items are completed):  Yes   Patient/family provided with [place] Clinical Social Work Department's list of facilities offering this level of care within the geographic area requested by the patient (or if unable, by the patient's family).  Yes   Patient/family informed of their freedom to choose among providers that offer the needed level of care, that participate in Medicare, Medicaid or managed care program needed by the patient, have an available bed and are willing to accept the patient.  Yes   Patient/family informed of 's ownership interest in [facility] and [facility], as well as of the fact that they are under no obligation to receive care at these facilities.  PASRR submitted to EDS on 02/26/18     PASRR number received on 02/26/18     Existing PASRR number confirmed on       FL2 transmitted to all facilities in geographic area requested by pt/family on 02/26/18     FL2 transmitted to all facilities within larger geographic area on       Patient informed that his/her managed care company has contracts with or will negotiate with certain facilities, including the following:        Yes   Patient/family informed of bed offers received.  Patient chooses bed at ([facility] )     Physician recommends and patient chooses bed at      Patient to be transferred to ([facility] ) on 02/28/18.  Patient to be transferred to facility by (Patient's wife [name] will transport. )     Patient family notified on 02/28/18 of transfer.  Name of family member notified:  (Patient's wife [name] is at bedside and aware of D/C today. )     PHYSICIAN        Additional Comment:    _______________________________________________ Danta Baumgardner, Darleen Crocker, LCSW 02/28/2018, 12:32 PM

## 2018-02-28 NOTE — Care Management Important Message (Signed)
Important Message  Patient Details  Name: Allen Potter MRN: [id number] Date of Birth: 1926-12-20   Medicare Important Message Given:  Yes    Olegario Messier A Karizma Cheek 02/28/2018, 11:27 AM

## 2018-02-28 NOTE — Care Management Important Message (Signed)
Important Message  Patient Details  Name: Allen Potter MRN: [id number] Date of Birth: 11/25/1926   Medicare Important Message Given:  Yes    Seif Teichert A Jacobi Ryant 02/28/2018, 11:27 AM 

## 2018-02-28 NOTE — Progress Notes (Signed)
Patient is medically stable for D/C to [facility] today. Per Tammy admissions coordinator at [facility] patient can come today to room 503. RN will call report. Patient's wife [name] will transport. Clinical Child psychotherapist (CSW) sent D/C orders to [facility] via [facility]. Patient is aware of above. Patient's wife [name] is at bedside and aware of above. Please reconsult if future social work needs arise. CSW signing off.   [employer], LCSW [phone]

## 2018-02-28 NOTE — Progress Notes (Signed)
Pt D/C to [facility] by wife. IV removed intact. VSS. Paperwork handed to wife to give to [employer] @ [facility]. Report called to [facility]. 6

## 2018-02-28 NOTE — Progress Notes (Signed)
Physical Therapy Treatment Patient Details Name: Allen Potter MRN: [id number] DOB: [birth year] Today's Date: 02/28/2018    History of Present Illness 84 yo male with onset of pleural effusion and dehydration was admitted with mod L ACA hemorrhage and L frontal lobe involvement. Had dehydration with AKI, falls prior to admission. R elbow skin tear from fall. PMHx:  cardiomegaly, strokes, COPD, RLS    PT Comments    Pt in bed, slept well.  To edge of bed with rail and increased effort but did not need physical assist today.  Pt stood with min a x 1 with mod vc;s for hand placements.  He was able to increase his ambulation distance today and completed a lap around unit.  He was motivated to walk and persisted despite fatigue.  Pt required min/mod a x 1 due to post and right lean with gait which increased with fatigue.  Overall balance remains impaired and poor safety.  He has difficulty navigating objects and turns in room.  He needs verbal and tactile cues for proper walker placement as he often lets it drift too far in front of him or off to the side.  He continues to require skilled interventions to improve gait, balance and overall safety.  He remains at an increased fall risk and is unsafe to walk unassisted.   Follow Up Recommendations  SNF     Equipment Recommendations  Rolling walker with 5" wheels    Recommendations for Other Services       Precautions / Restrictions Precautions Precautions: Fall Precaution Comments: monitor HR Restrictions Weight Bearing Restrictions: No    Mobility  Bed Mobility Overal bed mobility: Needs Assistance Bed Mobility: Supine to Sit     Supine to sit: Min guard     General bed mobility comments: able to get upright today with use of rail  Transfers Overall transfer level: Needs assistance Equipment used: Rolling walker (2 wheeled) Transfers: Sit to/from Stand Sit to Stand: Min assist         General transfer comment: cues for hand  placement; mild unsteadiness. Mild posterior lean  Ambulation/Gait Ambulation/Gait assistance: Min assist;Mod assist Ambulation Distance (Feet): 200 Feet Assistive device: Rolling walker (2 wheeled) Gait Pattern/deviations: Step-through pattern;Narrow base of support Gait velocity: reduced Gait velocity interpretation: <1.8 ft/sec, indicate of risk for recurrent falls General Gait Details: mild unsteadiness that increases with fatigues, turns, and when navigating objects and in room.   Stairs             Wheelchair Mobility    Modified Rankin (Stroke Patients Only)       Balance Overall balance assessment: Needs assistance Sitting-balance support: Feet supported;Bilateral upper extremity supported Sitting balance-Leahy Scale: Fair     Standing balance support: Bilateral upper extremity supported Standing balance-Leahy Scale: Poor Standing balance comment: post right lean which increases with fatigue                            Cognition Arousal/Alertness: Awake/alert Behavior During Therapy: WFL for tasks assessed/performed Overall Cognitive Status: Within Functional Limits for tasks assessed                                        Exercises      General Comments        Pertinent Vitals/Pain Pain Assessment: No/denies pain    Home  Living                      Prior Function            PT Goals (current goals can now be found in the care plan section) Progress towards PT goals: Progressing toward goals    Frequency    7X/week      PT Plan Current plan remains appropriate    Co-evaluation              AM-PAC PT "6 Clicks" Daily Activity  Outcome Measure  Difficulty turning over in bed (including adjusting bedclothes, sheets and blankets)?: None Difficulty moving from lying on back to sitting on the side of the bed? : A Little Difficulty sitting down on and standing up from a chair with arms (e.g.,  wheelchair, bedside commode, etc,.)?: Unable Help needed moving to and from a bed to chair (including a wheelchair)?: A Little Help needed walking in hospital room?: A Little Help needed climbing 3-5 steps with a railing? : A Lot 6 Click Score: 16    End of Session Equipment Utilized During Treatment: Gait belt Activity Tolerance: Patient tolerated treatment well Patient left: in chair;with call bell/phone within reach;with family/visitor present;Other (comment)   Hemiplegia - Right/Left: Right Hemiplegia - dominant/non-dominant: Dominant Hemiplegia - caused by: Nontraumatic intracerebral hemorrhage     Time: 0828-0842 PT Time Calculation (min) (ACUTE ONLY): 14 min  Charges:  $Gait Training: 8-22 mins                    G Codes:       Danielle Dess, PTA 02/28/18, 9:20 AM

## 2018-02-28 NOTE — Discharge Summary (Signed)
[facility] [place] at [facility]   PATIENT NAME: Allen Potter    MR#:  [id number]  DATE OF BIRTH:  03-21-1927  DATE OF ADMISSION:  02/25/2018 ADMITTING PHYSICIAN: Bertrum Sol, MD  DATE OF DISCHARGE: 02/28/2018   PRIMARY CARE PHYSICIAN: Barbette Reichmann, MD    ADMISSION DIAGNOSIS:  Multiple falls confused extremity weakness rt side  DISCHARGE DIAGNOSIS:  Active Problems:   stroke   SECONDARY DIAGNOSIS:   Past Medical History:  Diagnosis Date  . COPD (chronic obstructive pulmonary disease) (HCC)   . Restless leg     HOSPITAL COURSE:   *Acute ischemic cerebral vascular accident  Continue aspirin, statin therapy,  Acute CVA per MRI of the brain,  Reviewed carotid Dopplers, echocardiogram, He passed speech study.  Aspiration/fall precaution Physical therapy suggested SNF.  Added plavix as per neurology.  *Acute probable acute kidney injury with dehydration Improving with IV fluid support.  *COPD without exacerbation Stable Breathing treatments as needed  *Acute frequent falls Most likely secondary to above plan of care stated above   DISCHARGE CONDITIONS:  stable  CONSULTS OBTAINED:  Treatment Team:  Pauletta Browns, MD Shaune Pollack, MD  DRUG ALLERGIES:  No Known Allergies  DISCHARGE MEDICATIONS:   Allergies as of 02/28/2018   No Known Allergies     Medication List    TAKE these medications   aspirin 81 MG EC tablet Take 1 tablet (81 mg total) by mouth daily. Start taking on:  03/01/2018   BREO ELLIPTA 100-25 MCG/INH Aepb Generic drug:  fluticasone furoate-vilanterol Inhale 1 puff into the lungs daily.   clopidogrel 75 MG tablet Commonly known as:  PLAVIX Take 1 tablet (75 mg total) by mouth daily. Start taking on:  03/01/2018   ferrous sulfate 325 (65 FE) MG tablet Take 1 tablet by mouth daily.   senna-docusate 8.6-50 MG tablet Commonly known as:  Senokot-S Take 1 tablet by mouth at bedtime as needed for  moderate constipation.   simvastatin 40 MG tablet Commonly known as:  ZOCOR Take 1 tablet (40 mg total) by mouth daily at 6 PM.   SPIRIVA HANDIHALER 18 MCG inhalation capsule Generic drug:  tiotropium Place 1 puff into inhaler and inhale daily.   VENTOLIN HFA 108 (90 Base) MCG/ACT inhaler Generic drug:  albuterol Inhale 2 puffs into the lungs every 6 (six) hours as needed.        DISCHARGE INSTRUCTIONS:    Follow with PMD in 1 week.  If you experience worsening of your admission symptoms, develop shortness of breath, life threatening emergency, suicidal or homicidal thoughts you must seek medical attention immediately by calling 911 or calling your MD immediately  if symptoms less severe.  You Must read complete instructions/literature along with all the possible adverse reactions/side effects for all the Medicines you take and that have been prescribed to you. Take any new Medicines after you have completely understood and accept all the possible adverse reactions/side effects.   Please note  You were cared for by a hospitalist during your hospital stay. If you have any questions about your discharge medications or the care you received while you were in the hospital after you are discharged, you can call the unit and asked to speak with the hospitalist on call if the hospitalist that took care of you is not available. Once you are discharged, your primary care physician will handle any further medical issues. Please note that NO REFILLS for any discharge medications will be authorized once  you are discharged, as it is imperative that you return to your primary care physician (or establish a relationship with a primary care physician if you do not have one) for your aftercare needs so that they can reassess your need for medications and monitor your lab values.    Today   CHIEF COMPLAINT:  No chief complaint on file.   HISTORY OF PRESENT ILLNESS:  Allen Potter  is a 84 y.o.  male with a known history of COPD, BPH, psoriasis, cataracts, presents with falling events that started on Friday and continue through to today, patient with right upper extremity and lower extremity weakness, was evaluated in the emergency room over the weekend, patient was subsequently discharged home, patient seen by his primary care provider today and was accepted as a direct admission for acute cerebrovascular accident with right hemiparesis, patient evaluated at the bedside with his wife and family friend, noted personality change as patient is usually the more talkative jovial person, CT head over the weekend was negative for any acute process, examination consistent with dehydration, patient is now being admitted for acute probable ischemic supravascular accident, acute kidney injury with dehydration.   VITAL SIGNS:  Blood pressure 121/80, pulse 86, temperature 98.1 F (36.7 C), temperature source Oral, resp. rate 16, height 5\' 9"  (1.753 m), weight 70.1 kg (154 lb 9.6 oz), SpO2 100 %.  I/O:    Intake/Output Summary (Last 24 hours) at 02/28/2018 1156 Last data filed at 02/28/2018 0900 Gross per 24 hour  Intake 4727.75 ml  Output 200 ml  Net 4527.75 ml    PHYSICAL EXAMINATION:   Constitutional: He is oriented to person, place, and time.  HENT:  Head: Normocephalic.  Mouth/Throat: Oropharynx is clear and moist.  Eyes: Pupils are equal, round, and reactive to light. Conjunctivae and EOM are normal. No scleral icterus.  Neck: Normal range of motion. Neck supple. No JVD present. No tracheal deviation present.  Cardiovascular: Normal rate, regular rhythm and normal heart sounds. Exam reveals no gallop.  No murmur heard. Pulmonary/Chest: Effort normal and breath sounds normal. No respiratory distress. He has no wheezes. He has no rales.  Abdominal: Soft. Bowel sounds are normal. He exhibits no distension. There is no tenderness. There is no rebound.  Musculoskeletal: Normal range of motion.  He exhibits no edema or tenderness.  Neurological: He is alert and oriented to person, place, and time.  Mild slurred speech and right sided weakness 4/5.  Skin: No rash noted. No erythema.  Psychiatric: He has a normal mood and affect.     DATA REVIEW:   CBC Recent Labs  Lab 02/25/18 1821  WBC 9.7  HGB 12.3*  HCT 36.7*  PLT 265    Chemistries  Recent Labs  Lab 02/25/18 1821 02/26/18 0415  NA 138 137  K 4.7 4.1  CL 101 103  CO2 29 25  GLUCOSE 98 78  BUN 35* 33*  CREATININE 1.13 1.00  CALCIUM 8.9 8.4*  AST 30  --   ALT 16*  --   ALKPHOS 70  --   BILITOT 0.5  --     Cardiac Enzymes Recent Labs  Lab 02/23/18 1404  TROPONINI <0.03    Microbiology Results  No results found for this or any previous visit.  RADIOLOGY:  No results found.  EKG:   Orders placed or performed during the hospital encounter of 02/25/18  . EKG 12-Lead  . EKG 12-Lead      Management plans discussed with the patient,  family and they are in agreement.  CODE STATUS:     Code Status Orders  (From admission, onward)        Start     Ordered   02/25/18 1811  Do not attempt resuscitation (DNR)  Continuous    Question Answer Comment  In the event of cardiac or respiratory ARREST Do not call a "code blue"   In the event of cardiac or respiratory ARREST Do not perform Intubation, CPR, defibrillation or ACLS   In the event of cardiac or respiratory ARREST Use medication by any route, position, wound care, and other measures to relive pain and suffering. May use oxygen, suction and manual treatment of airway obstruction as needed for comfort.   Comments Nurse may pronounce      02/25/18 1810    Code Status History    This patient has a current code status but no historical code status.      TOTAL TIME TAKING CARE OF THIS PATIENT: 35 minutes.    Altamese Dilling M.D on 02/28/2018 at 11:56 AM  Between 7am to 6pm - Pager - [phone]  After 6pm go to [email or web address] -  password [employer]  [facility]  Office  [phone]  CC: Primary care physician; Barbette Reichmann, MD   Note: This dictation was prepared with Dragon dictation along with smaller phrase technology. Any transcriptional errors that result from this process are unintentional.

## 2018-03-18 DIAGNOSIS — R471 Dysarthria and anarthria: Secondary | ICD-10-CM | POA: Insufficient documentation

## 2018-03-18 DIAGNOSIS — R2689 Other abnormalities of gait and mobility: Secondary | ICD-10-CM | POA: Insufficient documentation

## 2018-03-18 DIAGNOSIS — Z8673 Personal history of transient ischemic attack (TIA), and cerebral infarction without residual deficits: Secondary | ICD-10-CM | POA: Insufficient documentation

## 2018-03-30 DIAGNOSIS — I639 Cerebral infarction, unspecified: Secondary | ICD-10-CM

## 2018-03-30 HISTORY — DX: Cerebral infarction, unspecified: I63.9

## 2018-04-16 ENCOUNTER — Other Ambulatory Visit: Payer: Self-pay | Admitting: Internal Medicine

## 2018-04-16 ENCOUNTER — Ambulatory Visit
Admission: RE | Admit: 2018-04-16 | Discharge: 2018-04-16 | Disposition: A | Discharge status: Home / Self Care | Payer: Medicare Other | Source: Ambulatory Visit | Attending: Internal Medicine | Admitting: Internal Medicine

## 2018-04-16 DIAGNOSIS — M7989 Other specified soft tissue disorders: Secondary | ICD-10-CM

## 2018-04-16 DIAGNOSIS — Z8673 Personal history of transient ischemic attack (TIA), and cerebral infarction without residual deficits: Secondary | ICD-10-CM | POA: Diagnosis not present

## 2018-04-16 DIAGNOSIS — I824Z1 Acute embolism and thrombosis of unspecified deep veins of right distal lower extremity: Secondary | ICD-10-CM | POA: Diagnosis not present

## 2018-05-13 ENCOUNTER — Telehealth: Payer: Self-pay

## 2018-05-13 NOTE — Telephone Encounter (Signed)
error 

## 2018-05-21 ENCOUNTER — Other Ambulatory Visit: Payer: Self-pay | Admitting: Internal Medicine

## 2018-05-21 ENCOUNTER — Ambulatory Visit
Admission: RE | Admit: 2018-05-21 | Discharge: 2018-05-21 | Disposition: A | Discharge status: Home / Self Care | Payer: Medicare Other | Source: Ambulatory Visit | Attending: Internal Medicine | Admitting: Internal Medicine

## 2018-05-21 DIAGNOSIS — R55 Syncope and collapse: Secondary | ICD-10-CM | POA: Insufficient documentation

## 2018-05-21 DIAGNOSIS — R471 Dysarthria and anarthria: Secondary | ICD-10-CM | POA: Insufficient documentation

## 2018-05-21 DIAGNOSIS — J449 Chronic obstructive pulmonary disease, unspecified: Secondary | ICD-10-CM | POA: Insufficient documentation

## 2018-05-21 DIAGNOSIS — Z8673 Personal history of transient ischemic attack (TIA), and cerebral infarction without residual deficits: Secondary | ICD-10-CM | POA: Insufficient documentation

## 2018-06-05 NOTE — Progress Notes (Signed)
06/06/2018 11:21 AM   Rolan Bucco 1927-10-21 [id number]  Referring provider: Barbette Reichmann, MD [address] [facility] [place], [place] [address]  Chief Complaint  Patient presents with  . Urinary Incontinence    HPI: Patient is a 84 -year-old Caucasian male who is referred to [place] by Dr. Barbette Reichmann for urinary incontinence with his wife, [name].    Patient states that he has had urinary incontinence since his stroke in April 2019.  He is having leaking that is unpredictable.  His is using pull ups.  He is going through 4 to 6 pull ups a day.    He is having associated urinary frequency, urgency, nocturia, intermittency, hesitancy and straining to urinate.  Patient denies any gross hematuria, dysuria or suprapubic/flank pain.  Patient denies any fevers, chills, nausea or vomiting.     He does not have a history of urinary tract infections, STI's or injury to the bladder.    He does not have a history of nephrolithiasis, GU surgery or GU trauma.  He admits to constipation and/or diarrhea.   He is drinking a lot of water daily.   He is drinking one to two Dr. Reino Kent and Pepsi daily.  He drinks a cup of coffee daily.  He is not drinking alcohol.    PMH: Past Medical History:  Diagnosis Date  . COPD (chronic obstructive pulmonary disease) (HCC)   . Restless leg   . Stroke [facility])     Surgical History: No past surgical history on file.  Home Medications:  Allergies as of 06/06/2018   No Known Allergies     Medication List        Accurate as of 06/06/18 11:21 AM. Always use your most recent med list.          apixaban 5 MG Tabs tablet Commonly known as:  ELIQUIS Take by mouth.   aspirin 81 MG EC tablet Take 1 tablet (81 mg total) by mouth daily.   BREO ELLIPTA 100-25 MCG/INH Aepb Generic drug:  fluticasone furoate-vilanterol Inhale 1 puff into the lungs daily.   clopidogrel 75 MG tablet Commonly known as:  PLAVIX Take 1 tablet (75 mg  total) by mouth daily.   ferrous sulfate 325 (65 FE) MG tablet Take 1 tablet by mouth daily.   finasteride 5 MG tablet Commonly known as:  PROSCAR Take 1 tablet (5 mg total) by mouth daily.   senna-docusate 8.6-50 MG tablet Commonly known as:  Senokot-S Take 1 tablet by mouth at bedtime as needed for moderate constipation.   simvastatin 40 MG tablet Commonly known as:  ZOCOR Take 1 tablet (40 mg total) by mouth daily at 6 PM.   SPIRIVA HANDIHALER 18 MCG inhalation capsule Generic drug:  tiotropium Place 1 puff into inhaler and inhale daily.   tamsulosin 0.4 MG Caps capsule Commonly known as:  FLOMAX Take 1 capsule (0.4 mg total) by mouth daily.   VENTOLIN HFA 108 (90 Base) MCG/ACT inhaler Generic drug:  albuterol Inhale 2 puffs into the lungs every 6 (six) hours as needed.       Allergies: No Known Allergies  Family History: History reviewed. No pertinent family history.  Social History:  reports that he has never smoked. He has never used smokeless tobacco. He reports that he drank alcohol. His drug history is not on file.  ROS: UROLOGY Frequent Urination?: Yes Hard to postpone urination?: Yes Burning/pain with urination?: No Get up at night to urinate?: No Leakage  of urine?: Yes Urine stream starts and stops?: Yes Trouble starting stream?: Yes Do you have to strain to urinate?: Yes Blood in urine?: No Urinary tract infection?: No Sexually transmitted disease?: No Injury to kidneys or bladder?: No Painful intercourse?: No Weak stream?: No Erection problems?: Yes Penile pain?: No  Gastrointestinal Nausea?: No Vomiting?: No Indigestion/heartburn?: No Diarrhea?: Yes Constipation?: Yes  Constitutional Fever: No Night sweats?: No Weight loss?: Yes Fatigue?: No  Skin Skin rash/lesions?: No Itching?: Yes  Eyes Blurred vision?: Yes Double vision?: No  Ears/Nose/Throat Sore throat?: No Sinus problems?: No  Hematologic/Lymphatic Swollen  glands?: No Easy bruising?: Yes  Cardiovascular Leg swelling?: Yes Chest pain?: No  Respiratory Cough?: No Shortness of breath?: No     Musculoskeletal Back pain?: No Joint pain?: No  Neurological Headaches?: No Dizziness?: No  Psychologic Depression?: No Anxiety?: No  Physical Exam: BP 117/69   Pulse 87   Ht 5\' 9"  (1.753 m)   Wt 153 lb (69.4 kg)   BMI 22.59 kg/m   Constitutional: Well nourished. Alert and oriented, No acute distress. HEENT: [place] AT, moist mucus membranes. Trachea midline, no masses. Cardiovascular: No clubbing, cyanosis, or edema. Respiratory: Normal respiratory effort, no increased work of breathing. GI: Abdomen is soft, non tender, non distended, no abdominal masses. Liver and spleen not palpable.  No hernias appreciated.  Stool sample for occult testing is not indicated.   GU: No CVA tenderness.  No bladder fullness or masses.  Patient with uncircumcised phallus.  Foreskin not easily retracted 1 cm aperture.  Urethral meatus is patent.  No penile discharge. No penile lesions or rashes. Scrotum without lesions, cysts, rashes and/or edema.  Testicles are located scrotally bilaterally. No masses are appreciated in the testicles. Left and right epididymis are normal. Rectal: Patient with  normal sphincter tone. Anus and perineum without scarring or rashes. No rectal masses are appreciated. Prostate is approximately 60 grams, could only palpated the apex and midportion of glands, no nodules are appreciated. Seminal vesicles are normal. Skin: No rashes, bruises or suspicious lesions. Lymph: No cervical or inguinal adenopathy. Neurologic: Grossly intact, no focal deficits, moving all 4 extremities. Psychiatric: Normal mood and affect.  Laboratory Data: Lab Results  Component Value Date   WBC 9.7 02/25/2018   HGB 12.3 (L) 02/25/2018   HCT 36.7 (L) 02/25/2018   MCV 84.5 02/25/2018   PLT 265 02/25/2018    Lab Results  Component Value Date    CREATININE 1.00 02/26/2018    No results found for: PSA  No results found for: TESTOSTERONE  Lab Results  Component Value Date   HGBA1C 5.9 (H) 02/26/2018    No results found for: TSH     Component Value Date/Time   CHOL 137 02/26/2018 0415   HDL 47 02/26/2018 0415   CHOLHDL 2.9 02/26/2018 0415   VLDL 15 02/26/2018 0415   LDLCALC 75 02/26/2018 0415    Lab Results  Component Value Date   AST 30 02/25/2018   Lab Results  Component Value Date   ALT 16 (L) 02/25/2018   No components found for: ALKALINEPHOPHATASE No components found for: BILIRUBINTOTAL  No results found for: ESTRADIOL  Urinalysis No results found for: COLORURINE, APPEARANCEUR, LABSPEC, PHURINE, GLUCOSEU, HGBUR, BILIRUBINUR, KETONESUR, PROTEINUR, UROBILINOGEN, NITRITE, LEUKOCYTESUR  I have reviewed the labs.   Pertinent Imaging: Results for RASHIDI, LOH (MRN [id number]) as of 06/06/2018 11:15  Ref. Range 06/06/2018 10:31  Scan Result Unknown <936ml    Assessment & Plan:  1. Overflow incontinence Due to retention Foley catheter in for one week Flomax and Proscar are initiated ? If due to BPH vs CVA - will reassess in the weeks to come  2. BPH with LUTS Continue conservative management, avoiding bladder irritants and timed voiding's Most bothersome symptoms is/are constant leaking Initiate alpha-blocker (tamsulosin 0.4 mg daily), discussed side effects  Initiate 5 alpha reductase inhibitor (finasteride 5 mg daily), discussed side effects  Foley in one week than TOV  3. Urinary retention See above   Return in about 1 week (around 06/13/2018) for TOV .  These notes generated with voice recognition software. I apologize for typographical errors.  Michiel Cowboy, PA-C  [facility] [address] [place], [place] [address] [phone]

## 2018-06-06 ENCOUNTER — Ambulatory Visit (INDEPENDENT_AMBULATORY_CARE_PROVIDER_SITE_OTHER): Payer: Medicare Other | Admitting: Urology

## 2018-06-06 ENCOUNTER — Encounter: Payer: Self-pay | Admitting: Urology

## 2018-06-06 VITALS — BP 117/69 | HR 87 | Ht 69.0 in | Wt 153.0 lb

## 2018-06-06 DIAGNOSIS — I639 Cerebral infarction, unspecified: Secondary | ICD-10-CM | POA: Diagnosis not present

## 2018-06-06 DIAGNOSIS — R339 Retention of urine, unspecified: Secondary | ICD-10-CM

## 2018-06-06 DIAGNOSIS — N138 Other obstructive and reflux uropathy: Secondary | ICD-10-CM

## 2018-06-06 DIAGNOSIS — N3941 Urge incontinence: Secondary | ICD-10-CM

## 2018-06-06 DIAGNOSIS — N401 Enlarged prostate with lower urinary tract symptoms: Secondary | ICD-10-CM

## 2018-06-06 DIAGNOSIS — N39490 Overflow incontinence: Secondary | ICD-10-CM | POA: Diagnosis not present

## 2018-06-06 LAB — BLADDER SCAN AMB NON-IMAGING

## 2018-06-06 MED ORDER — FINASTERIDE 5 MG PO TABS: 5.0000 mg | ORAL_TABLET | Freq: Every day | ORAL | 3 refills | Status: DC

## 2018-06-06 MED ORDER — TAMSULOSIN HCL 0.4 MG PO CAPS: 0.4000 mg | ORAL_CAPSULE | Freq: Every day | ORAL | 3 refills | Status: DC

## 2018-06-06 NOTE — Progress Notes (Signed)
Simple Catheter Placement  Due to urinary retention patient is present today for a foley cath placement.  Patient was cleaned and prepped in a sterile fashion with betadine and lidocaine jelly 2% was instilled into the urethra.  A 16 coude FR foley catheter was inserted, urine return was noted  , urine was yellow in color.  The balloon was filled with 10cc of sterile water.  A leg bag was attached for drainage. Patient was also given a night bag to take home and was given instruction on how to change from one bag to another.  Patient was given instruction on proper catheter care.  Patient tolerated well, no complications were noted   Preformed by: Eligha Bridegroom, CMA  Additional notes/ Follow up: 1 wk for VT

## 2018-06-07 ENCOUNTER — Ambulatory Visit: Payer: Medicare Other

## 2018-06-07 DIAGNOSIS — R55 Syncope and collapse: Secondary | ICD-10-CM | POA: Insufficient documentation

## 2018-06-07 DIAGNOSIS — R310 Gross hematuria: Secondary | ICD-10-CM

## 2018-06-07 DIAGNOSIS — R3121 Asymptomatic microscopic hematuria: Secondary | ICD-10-CM | POA: Insufficient documentation

## 2018-06-07 DIAGNOSIS — E782 Mixed hyperlipidemia: Secondary | ICD-10-CM | POA: Insufficient documentation

## 2018-06-07 DIAGNOSIS — I82439 Acute embolism and thrombosis of unspecified popliteal vein: Secondary | ICD-10-CM | POA: Insufficient documentation

## 2018-06-07 NOTE — Progress Notes (Signed)
Pt present in office today for nurse visit. Pt states he is concerned because his catheter bag is filling up with mostly blood. Pt informed this is normal for him. He was instructed to drink more water (increase from 3 bottles to 6 daily). Return if sx's worsen.

## 2018-06-12 ENCOUNTER — Emergency Department: Payer: Medicare Other

## 2018-06-12 ENCOUNTER — Other Ambulatory Visit: Payer: Self-pay

## 2018-06-12 ENCOUNTER — Inpatient Hospital Stay
Admission: EM | Admit: 2018-06-12 | Discharge: 2018-06-15 | DRG: 871 | Disposition: A | Discharge status: Home Health | Payer: Medicare Other | Attending: Internal Medicine | Admitting: Internal Medicine

## 2018-06-12 DIAGNOSIS — R7881 Bacteremia: Secondary | ICD-10-CM | POA: Diagnosis not present

## 2018-06-12 DIAGNOSIS — R319 Hematuria, unspecified: Secondary | ICD-10-CM | POA: Diagnosis not present

## 2018-06-12 DIAGNOSIS — A4151 Sepsis due to Escherichia coli [E. coli]: Principal | ICD-10-CM | POA: Diagnosis present

## 2018-06-12 DIAGNOSIS — I69398 Other sequelae of cerebral infarction: Secondary | ICD-10-CM | POA: Diagnosis not present

## 2018-06-12 DIAGNOSIS — E876 Hypokalemia: Secondary | ICD-10-CM | POA: Diagnosis present

## 2018-06-12 DIAGNOSIS — Z7901 Long term (current) use of anticoagulants: Secondary | ICD-10-CM

## 2018-06-12 DIAGNOSIS — R32 Unspecified urinary incontinence: Secondary | ICD-10-CM | POA: Diagnosis not present

## 2018-06-12 DIAGNOSIS — Z7902 Long term (current) use of antithrombotics/antiplatelets: Secondary | ICD-10-CM | POA: Diagnosis not present

## 2018-06-12 DIAGNOSIS — N170 Acute kidney failure with tubular necrosis: Secondary | ICD-10-CM | POA: Diagnosis present

## 2018-06-12 DIAGNOSIS — J449 Chronic obstructive pulmonary disease, unspecified: Secondary | ICD-10-CM | POA: Diagnosis present

## 2018-06-12 DIAGNOSIS — A419 Sepsis, unspecified organism: Secondary | ICD-10-CM

## 2018-06-12 DIAGNOSIS — N401 Enlarged prostate with lower urinary tract symptoms: Secondary | ICD-10-CM | POA: Diagnosis present

## 2018-06-12 DIAGNOSIS — R6521 Severe sepsis with septic shock: Secondary | ICD-10-CM | POA: Diagnosis present

## 2018-06-12 DIAGNOSIS — R6883 Chills (without fever): Secondary | ICD-10-CM | POA: Diagnosis not present

## 2018-06-12 DIAGNOSIS — Z79899 Other long term (current) drug therapy: Secondary | ICD-10-CM | POA: Diagnosis not present

## 2018-06-12 DIAGNOSIS — R338 Other retention of urine: Secondary | ICD-10-CM | POA: Diagnosis present

## 2018-06-12 DIAGNOSIS — R8271 Bacteriuria: Secondary | ICD-10-CM | POA: Diagnosis not present

## 2018-06-12 DIAGNOSIS — B9620 Unspecified Escherichia coli [E. coli] as the cause of diseases classified elsewhere: Secondary | ICD-10-CM | POA: Diagnosis not present

## 2018-06-12 DIAGNOSIS — Z7982 Long term (current) use of aspirin: Secondary | ICD-10-CM | POA: Diagnosis not present

## 2018-06-12 DIAGNOSIS — E785 Hyperlipidemia, unspecified: Secondary | ICD-10-CM | POA: Diagnosis present

## 2018-06-12 DIAGNOSIS — R42 Dizziness and giddiness: Secondary | ICD-10-CM | POA: Diagnosis not present

## 2018-06-12 DIAGNOSIS — B9561 Methicillin susceptible Staphylococcus aureus infection as the cause of diseases classified elsewhere: Secondary | ICD-10-CM | POA: Diagnosis not present

## 2018-06-12 DIAGNOSIS — G2581 Restless legs syndrome: Secondary | ICD-10-CM | POA: Diagnosis present

## 2018-06-12 DIAGNOSIS — R339 Retention of urine, unspecified: Secondary | ICD-10-CM | POA: Diagnosis not present

## 2018-06-12 DIAGNOSIS — Z86718 Personal history of other venous thrombosis and embolism: Secondary | ICD-10-CM

## 2018-06-12 DIAGNOSIS — N3000 Acute cystitis without hematuria: Secondary | ICD-10-CM | POA: Diagnosis present

## 2018-06-12 DIAGNOSIS — N179 Acute kidney failure, unspecified: Secondary | ICD-10-CM

## 2018-06-12 DIAGNOSIS — Z8673 Personal history of transient ischemic attack (TIA), and cerebral infarction without residual deficits: Secondary | ICD-10-CM

## 2018-06-12 DIAGNOSIS — Z9849 Cataract extraction status, unspecified eye: Secondary | ICD-10-CM

## 2018-06-12 DIAGNOSIS — I959 Hypotension, unspecified: Secondary | ICD-10-CM | POA: Diagnosis not present

## 2018-06-12 DIAGNOSIS — J9811 Atelectasis: Secondary | ICD-10-CM | POA: Diagnosis not present

## 2018-06-12 HISTORY — DX: Hyperlipidemia, unspecified: E78.5

## 2018-06-12 LAB — LACTIC ACID, PLASMA
LACTIC ACID, VENOUS: 2 mmol/L — AB (ref 0.5–1.9)
LACTIC ACID, VENOUS: 1.7 mmol/L (ref 0.5–1.9)

## 2018-06-12 LAB — COMPREHENSIVE METABOLIC PANEL
ALBUMIN: 2.6 g/dL — AB (ref 3.5–5.0)
ALT: 21 U/L (ref 0–44)
ANION GAP: 7 (ref 5–15)
AST: 32 U/L (ref 15–41)
Alkaline Phosphatase: 53 U/L (ref 38–126)
BILIRUBIN TOTAL: 0.5 mg/dL (ref 0.3–1.2)
BUN: 38 mg/dL — AB (ref 8–23)
CHLORIDE: 102 mmol/L (ref 98–111)
CO2: 26 mmol/L (ref 22–32)
Calcium: 8.1 mg/dL — ABNORMAL LOW (ref 8.9–10.3)
Creatinine, Ser: 1.58 mg/dL — ABNORMAL HIGH (ref 0.61–1.24)
GFR calc Af Amer: 43 mL/min — ABNORMAL LOW (ref >60)
GFR calc non Af Amer: 37 mL/min — ABNORMAL LOW (ref >60)
GLUCOSE: 122 mg/dL — AB (ref 70–99)
POTASSIUM: 3.8 mmol/L (ref 3.5–5.1)
SODIUM: 135 mmol/L (ref 135–145)
TOTAL PROTEIN: 7 g/dL (ref 6.5–8.1)

## 2018-06-12 LAB — TROPONIN I: Troponin I: 0.03 ng/mL (ref <0.03)

## 2018-06-12 LAB — CBC WITH DIFFERENTIAL/PLATELET
Basophils Absolute: 0 10*3/uL (ref 0–0.1)
Basophils Relative: 0 %
EOS ABS: 0.3 10*3/uL (ref 0–0.7)
Eosinophils Relative: 3 %
HEMATOCRIT: 28.4 % — AB (ref 40.0–52.0)
HEMOGLOBIN: 9.4 g/dL — AB (ref 13.0–18.0)
LYMPHS ABS: 0.9 10*3/uL — AB (ref 1.0–3.6)
LYMPHS PCT: 10 %
MCH: 28.9 pg (ref 26.0–34.0)
MCHC: 33 g/dL (ref 32.0–36.0)
MCV: 87.5 fL (ref 80.0–100.0)
MONOS PCT: 10 %
Monocytes Absolute: 0.9 10*3/uL (ref 0.2–1.0)
NEUTROS ABS: 6.8 10*3/uL — AB (ref 1.4–6.5)
NEUTROS PCT: 77 %
Platelets: 269 10*3/uL (ref 150–440)
RBC: 3.25 MIL/uL — ABNORMAL LOW (ref 4.40–5.90)
RDW: 17.2 % — ABNORMAL HIGH (ref 11.5–14.5)
WBC: 8.9 10*3/uL (ref 3.8–10.6)

## 2018-06-12 LAB — URINALYSIS, COMPLETE (UACMP) WITH MICROSCOPIC
Bacteria, UA: NONE SEEN
Bilirubin Urine: NEGATIVE
GLUCOSE, UA: NEGATIVE mg/dL
Ketones, ur: NEGATIVE mg/dL
NITRITE: NEGATIVE
PH: 6 (ref 5.0–8.0)
Protein, ur: 100 mg/dL — AB
RBC / HPF: >50 RBC/hpf — ABNORMAL HIGH (ref 0–5)
Specific Gravity, Urine: 1.015 (ref 1.005–1.030)

## 2018-06-12 LAB — APTT: APTT: 35 s (ref 24–36)

## 2018-06-12 LAB — CREATININE, SERUM
Creatinine, Ser: 1.34 mg/dL — ABNORMAL HIGH (ref 0.61–1.24)
GFR calc non Af Amer: 46 mL/min — ABNORMAL LOW (ref >60)
GFR, EST AFRICAN AMERICAN: 53 mL/min — AB (ref >60)

## 2018-06-12 LAB — LIPASE, BLOOD: Lipase: 35 U/L (ref 11–51)

## 2018-06-12 LAB — PROTIME-INR
INR: 1.83
PROTHROMBIN TIME: 21 s — AB (ref 11.4–15.2)

## 2018-06-12 LAB — PROCALCITONIN

## 2018-06-12 MED ORDER — ALBUTEROL SULFATE (2.5 MG/3ML) 0.083% IN NEBU: 2.5000 mg | INHALATION_SOLUTION | RESPIRATORY_TRACT | Status: DC | PRN

## 2018-06-12 MED ORDER — HYDROCODONE-ACETAMINOPHEN 5-325 MG PO TABS: 1.0000 | ORAL_TABLET | ORAL | Status: DC | PRN

## 2018-06-12 MED ORDER — FLUTICASONE FUROATE-VILANTEROL 100-25 MCG/INH IN AEPB
1.0000 | INHALATION_SPRAY | Freq: Every day | RESPIRATORY_TRACT | Status: DC
  Administered 2018-06-13 – 2018-06-15 (×3): 1 via RESPIRATORY_TRACT
  Filled 2018-06-12: qty 28

## 2018-06-12 MED ORDER — TIOTROPIUM BROMIDE MONOHYDRATE 18 MCG IN CAPS
18.0000 ug | ORAL_CAPSULE | Freq: Every day | RESPIRATORY_TRACT | Status: DC
  Administered 2018-06-13 – 2018-06-15 (×3): 18 ug via RESPIRATORY_TRACT
  Filled 2018-06-12: qty 5

## 2018-06-12 MED ORDER — SODIUM CHLORIDE 0.9 % IV BOLUS (SEPSIS)
1000.0000 mL | Freq: Once | INTRAVENOUS | Status: AC
  Administered 2018-06-12: 1000 mL via INTRAVENOUS

## 2018-06-12 MED ORDER — BISACODYL 5 MG PO TBEC: 5.0000 mg | DELAYED_RELEASE_TABLET | Freq: Every day | ORAL | Status: DC | PRN

## 2018-06-12 MED ORDER — ONDANSETRON HCL 4 MG/2ML IJ SOLN: 4.0000 mg | Freq: Four times a day (QID) | INTRAMUSCULAR | Status: DC | PRN

## 2018-06-12 MED ORDER — SODIUM CHLORIDE 0.9 % IV SOLN
INTRAVENOUS | Status: DC
  Administered 2018-06-12 – 2018-06-15 (×6): via INTRAVENOUS

## 2018-06-12 MED ORDER — SODIUM CHLORIDE 0.9 % IV BOLUS (SEPSIS)
250.0000 mL | Freq: Once | INTRAVENOUS | Status: AC
  Administered 2018-06-12: 250 mL via INTRAVENOUS

## 2018-06-12 MED ORDER — SIMVASTATIN 40 MG PO TABS
40.0000 mg | ORAL_TABLET | Freq: Every day | ORAL | Status: DC
  Administered 2018-06-13 – 2018-06-14 (×2): 40 mg via ORAL
  Filled 2018-06-12 (×3): qty 1

## 2018-06-12 MED ORDER — ACETAMINOPHEN 325 MG PO TABS: 650.0000 mg | ORAL_TABLET | Freq: Four times a day (QID) | ORAL | Status: DC | PRN

## 2018-06-12 MED ORDER — SENNOSIDES-DOCUSATE SODIUM 8.6-50 MG PO TABS: 1.0000 | ORAL_TABLET | Freq: Every evening | ORAL | Status: DC | PRN

## 2018-06-12 MED ORDER — SODIUM CHLORIDE 0.9 % IV SOLN
2.0000 g | Freq: Once | INTRAVENOUS | Status: AC
  Administered 2018-06-12: 2 g via INTRAVENOUS
  Filled 2018-06-12: qty 2

## 2018-06-12 MED ORDER — ONDANSETRON HCL 4 MG PO TABS: 4.0000 mg | ORAL_TABLET | Freq: Four times a day (QID) | ORAL | Status: DC | PRN

## 2018-06-12 MED ORDER — APIXABAN 5 MG PO TABS
5.0000 mg | ORAL_TABLET | Freq: Two times a day (BID) | ORAL | Status: DC
  Administered 2018-06-12 – 2018-06-15 (×6): 5 mg via ORAL
  Filled 2018-06-12 (×7): qty 1

## 2018-06-12 MED ORDER — ACETAMINOPHEN 650 MG RE SUPP: 650.0000 mg | Freq: Four times a day (QID) | RECTAL | Status: DC | PRN

## 2018-06-12 MED ORDER — SODIUM CHLORIDE 0.9 % IV SOLN
2.0000 g | INTRAVENOUS | Status: DC
  Filled 2018-06-12: qty 2

## 2018-06-12 NOTE — Progress Notes (Signed)
CODE SEPSIS - PHARMACY COMMUNICATION  **Broad Spectrum Antibiotics should be administered within 1 hour of Sepsis diagnosis**  Time Code Sepsis Called/Page Received: 1132  Antibiotics Ordered: cefepime  Time of 1st antibiotic administration: 1157  Additional action taken by pharmacy:   If necessary, Name of Provider/Nurse Contacted:     Valentina Gu ,PharmD Clinical Pharmacist  06/12/2018  12:46 PM

## 2018-06-12 NOTE — ED Notes (Signed)
Pt wife just came to desk and stated she had to leave to go home to care for pets, stated "my number is in chart if needed".

## 2018-06-12 NOTE — ED Notes (Signed)
Per Erica bed not cleaned at this time. No beds available. They will have it STAT cleaned. Will inform my Charge RN Erskine Squibb

## 2018-06-12 NOTE — Progress Notes (Signed)
Advanced Care Plan.  Purpose of Encounter: CODE STATUS. Parties in Attendance: The patient, his wife and me. Patient's Decisional Capacity: Yes. Medical Story: Allen Potter  is a 84 y.o. male with a known history of COPD, stroke, hyperlipidemia, restless leg, BPH and urinary retention.  The patient had urine retention recently and put on Foley catheter by urologist 1 week ago.  Now he is being admitted for septic shock due to UTI and acute renal failure.  I discussed with patient about his current condition, prognosis and CODE STATUS.  He stated that he wants to be resuscitated and intubated.  His wife confirms this. Plan:  Code Status: Full code Time spent discussing advance care planning: 17 minutes.

## 2018-06-12 NOTE — H&P (Addendum)
[facility] [place] [facility]   PATIENT NAME: Allen Potter    MR#:  [id number]  DATE OF BIRTH:  1927/03/24  DATE OF ADMISSION:  06/12/2018  PRIMARY CARE PHYSICIAN: Barbette Reichmann, MD   REQUESTING/REFERRING PHYSICIAN: Dr. Scotty Court  CHIEF COMPLAINT:   Chief Complaint  Patient presents with  . Hypotension   Generalized weakness for 2 days HISTORY OF PRESENT ILLNESS:  Allen Potter  is a 84 y.o. male with a known history of COPD, stroke, hyperlipidemia, restless leg, BPH and urinary retention.  The patient had urine retention recently and put on Foley catheter by urologist 1 week ago.  He is supposed to see urologist tomorrow.  But he has had generalized weakness and dizziness.  He was found hypotension in PCPs office.  He was found sepsis and a urinary infection.  Blood pressure decreased to 60s.  He is treated with normal saline bolus 2 L and cefepime in the ED.  Blood pressure is better.  PAST MEDICAL HISTORY:   Past Medical History:  Diagnosis Date  . COPD (chronic obstructive pulmonary disease) (HCC)   . DVT (deep vein thrombosis) in pregnancy (HCC)   . Hyperlipidemia   . Restless leg   . Stroke [facility])     PAST SURGICAL HISTORY:   Past Surgical History:  Procedure Laterality Date  . CATARACT EXTRACTION      SOCIAL HISTORY:   Social History   Tobacco Use  . Smoking status: Never Smoker  . Smokeless tobacco: Never Used  Substance Use Topics  . Alcohol use: Not Currently    Frequency: Never    FAMILY HISTORY:  No family history on file.  The patient does not know, both parents deceased.  DRUG ALLERGIES:  No Known Allergies  REVIEW OF SYSTEMS:   Review of Systems  Constitutional: Positive for malaise/fatigue. Negative for chills and fever.  HENT: Negative for sore throat.   Eyes: Negative for blurred vision and double vision.  Respiratory: Negative for cough, hemoptysis, shortness of breath, wheezing and stridor.     Cardiovascular: Negative for chest pain, palpitations, orthopnea and leg swelling.  Gastrointestinal: Negative for abdominal pain, blood in stool, diarrhea, melena, nausea and vomiting.  Genitourinary: Negative for dysuria, flank pain and hematuria.  Musculoskeletal: Negative for back pain and joint pain.  Neurological: Positive for dizziness. Negative for sensory change, focal weakness, seizures, loss of consciousness, weakness and headaches.  Endo/Heme/Allergies: Negative for polydipsia.  Psychiatric/Behavioral: Negative for depression. The patient is not nervous/anxious.     MEDICATIONS AT HOME:   Prior to Admission medications   Medication Sig Start Date End Date Taking? Authorizing Provider  albuterol (VENTOLIN HFA) 108 (90 Base) MCG/ACT inhaler Inhale 2 puffs into the lungs every 6 (six) hours as needed. 10/02/17  Yes [provider]  apixaban (ELIQUIS) 5 MG TABS tablet Take 5 mg by mouth 2 (two) times daily.  05/17/18  Yes [provider]  ferrous sulfate 325 (65 FE) MG tablet Take 1 tablet by mouth daily. 12/31/17 12/31/18 Yes [provider]  fluticasone furoate-vilanterol (BREO ELLIPTA) 100-25 MCG/INH AEPB Inhale 1 puff into the lungs daily. 10/02/17  Yes [provider]  senna-docusate (SENOKOT-S) 8.6-50 MG tablet Take 1 tablet by mouth at bedtime as needed for moderate constipation. 02/28/18  Yes Altamese Dilling, MD  simvastatin (ZOCOR) 40 MG tablet Take 1 tablet (40 mg total) by mouth daily at 6 PM. 02/28/18  Yes Altamese Dilling, MD  tiotropium (SPIRIVA HANDIHALER) 18 MCG inhalation  capsule Place 1 puff into inhaler and inhale daily. 10/02/17  Yes [provider]  aspirin EC 81 MG EC tablet Take 1 tablet (81 mg total) by mouth daily. Patient not taking: Reported on 06/12/2018 03/01/18   Altamese Dilling, MD  clopidogrel (PLAVIX) 75 MG tablet Take 1 tablet (75 mg total) by mouth daily. Patient not taking: Reported on 06/12/2018  03/01/18   Altamese Dilling, MD  finasteride (PROSCAR) 5 MG tablet Take 1 tablet (5 mg total) by mouth daily. Patient not taking: Reported on 06/12/2018 06/06/18   Michiel Cowboy A, PA-C  tamsulosin (FLOMAX) 0.4 MG CAPS capsule Take 1 capsule (0.4 mg total) by mouth daily. Patient not taking: Reported on 06/12/2018 06/06/18   Michiel Cowboy A, PA-C      VITAL SIGNS:  Blood pressure 96/60, pulse 88, temperature (!) 96.3 F (35.7 C), temperature source Axillary, resp. rate 16, height 5\' 9"  (1.753 m), weight 69.4 kg, SpO2 100 %.  PHYSICAL EXAMINATION:  Physical Exam  GENERAL:  84 y.o.-year-old patient lying in the bed with no acute distress.  EYES: Pupils equal, round, reactive to light and accommodation. No scleral icterus. Extraocular muscles intact.  HEENT: Head atraumatic, normocephalic. Oropharynx and nasopharynx clear.  NECK:  Supple, no jugular venous distention. No thyroid enlargement, no tenderness.  LUNGS: Normal breath sounds bilaterally, no wheezing, rales,rhonchi or crepitation. No use of accessory muscles of respiration.  CARDIOVASCULAR: S1, S2 normal. No murmurs, rubs, or gallops.  ABDOMEN: Soft, nontender, nondistended. Bowel sounds present. No organomegaly or mass.  Foley catheter in situ. EXTREMITIES: No pedal edema, cyanosis, or clubbing.  NEUROLOGIC: Cranial nerves II through XII are intact. Muscle strength 5/5 in all extremities. Sensation intact. Gait not checked.  PSYCHIATRIC: The patient is alert and oriented x 3.  SKIN: No obvious rash, lesion, or ulcer.   LABORATORY PANEL:   CBC Recent Labs  Lab 06/12/18 1130  WBC 8.9  HGB 9.4*  HCT 28.4*  PLT 269   ------------------------------------------------------------------------------------------------------------------  Chemistries  Recent Labs  Lab 06/12/18 1130  NA 135  K 3.8  CL 102  CO2 26  GLUCOSE 122*  BUN 38*  CREATININE 1.58*  CALCIUM 8.1*  AST 32  ALT 21  ALKPHOS 53  BILITOT 0.5    ------------------------------------------------------------------------------------------------------------------  Cardiac Enzymes Recent Labs  Lab 06/12/18 1130  TROPONINI 0.03*   ------------------------------------------------------------------------------------------------------------------  RADIOLOGY:  Dg Chest Port 1 View  Result Date: 06/12/2018 CLINICAL DATA:  Shortness of breath, hypertension. EXAM: PORTABLE CHEST 1 VIEW COMPARISON:  Radiographs of Mar 28, 2018. FINDINGS: Stable cardiomediastinal silhouette. No pneumothorax is noted. Right lung is clear. Mild left basilar atelectasis or scarring is noted in left lung base with possible minimal left pleural effusion. Bony thorax is unremarkable. IMPRESSION: Mild left basilar atelectasis or scarring is noted in left lung base with possible minimal left pleural effusion. Electronically Signed   By: Lupita Raider, M.D.   On: 06/12/2018 11:59      IMPRESSION AND PLAN:   Sepsis due to UTI The patient will be admitted to medical floor. Continue cefepime, follow-up with CBC, blood culture and urine culture. IV fluid support.  Septic shock.  Improved with normal saline bolus and cefepime. Acute renal failure.  Continue IV fluid support and follow-up BMP. Lactic acidosis due to above.  Improved with normal saline bolus. Urinary retention, on Foley catheter.  Urology consult. COPD.  Stable.  Nebulizer as needed. History of DVT.  On Eliquis. All the records are reviewed and case discussed  with ED provider. Management plans discussed with the patient, his wife and they are in agreement.  CODE STATUS: Full code  TOTAL TIME TAKING CARE OF THIS PATIENT: 42 minutes.    Shaune Pollack M.D on 06/12/2018 at 4:59 PM  Between 7am to 6pm - Pager - [phone]  After 6pm go to [email or web address] - Social research officer, government  [facility]  Office  [phone]  CC: Primary care physician; Barbette Reichmann,  MD   Note: This dictation was prepared with Dragon dictation along with smaller phrase technology. Any transcriptional errors that result from this process are unin

## 2018-06-12 NOTE — ED Notes (Signed)
Pt cleaned up at this time. Pt had small stool accident

## 2018-06-12 NOTE — ED Notes (Signed)
Date and time results received: 06/12/18 1230 (use smartphrase ".now" to insert current time)  Test: lactic Critical Value: 2.0  Name of Provider Notified: stafford  Orders Received? Or Actions Taken?: Orders Received - See Orders for details

## 2018-06-12 NOTE — ED Notes (Signed)
Attempted to call report. RN will call back. 

## 2018-06-12 NOTE — Progress Notes (Signed)
Pharmacy Antibiotic Note  Allen Potter is a 84 y.o. male admitted on 06/12/2018 with UTI/sepsis.  Pharmacy has been consulted for Cefepime dosing.  Patient had foley catheter inserted last week for urinary retention  Plan: Patient received Cefepime 2 gram IV x 1 in ER. Will continue with Cefepime 2 gram IV q24h for Crcl 31.7 ml/min   Height: 5\' 9"  (175.3 cm) Weight: 153 lb (69.4 kg) IBW/kg (Calculated) : 70.7  Temp (24hrs), Avg:96.3 F (35.7 C), Min:96.3 F (35.7 C), Max:96.3 F (35.7 C)  Recent Labs  Lab 06/12/18 1130 06/12/18 1310  WBC 8.9  --   CREATININE 1.58*  --   LATICACIDVEN 2.0* 1.7    Estimated Creatinine Clearance: 31.7 mL/min (A) (by C-G formula based on SCr of 1.58 mg/dL (H)).    No Known Allergies  Antimicrobials this admission: Cefepime 8/14 >>       >>    Dose adjustments this admission:    Microbiology results: 8/14 BCx: pending 8/14 UCx: pending    Sputum:      MRSA PCR:    Thank you for allowing pharmacy to be a part of this patient's care.  [name] 06/12/2018 4:54 PM

## 2018-06-12 NOTE — ED Notes (Signed)
Date and time results received: 06/12/18 1230 (use smartphrase ".now" to insert current time)  Test: troponin Critical Value: 0.03  Name of Provider Notified: stafford  Orders Received? Or Actions Taken?: Orders Received - See Orders for details

## 2018-06-12 NOTE — ED Notes (Signed)
Pt cleaned with this NT and Sam, Charity fundraiser. Large watery stool.

## 2018-06-12 NOTE — ED Triage Notes (Signed)
Pt comes via POV from Dr. Marcello Fennel office. Pt went to doctor for regular checkup. Pt's wife states that pt got out of car and felt dizzy. Pts BP checked and was really low per MD and for pt to come ti ED immediatly. Pt is alert and oriented. Pt denies any pain. Pt does have Foley that has been in place since last Thursday.

## 2018-06-12 NOTE — ED Provider Notes (Signed)
[facility] Emergency Department Provider Note  ____________________________________________  Time seen: Approximately 4:46 PM  I have reviewed the triage vital signs and the nursing notes.   HISTORY  Chief Complaint Hypotension  Level 5 Caveat: Portions of the History and Physical including HPI and review of systems are unable to be completely obtained due to patient being a poor historian due to previous stroke and acute critical illness   HPI Allen Potter is a 84 y.o. male with a history of COPD and stroke  who was sent to the ED from her PCP office due to hypotension.  He feels very lightheaded like is going to pass out and also short of breath.  Symptoms have been gradually worsening for the past 2 days.  He has a history of urinary retention as well, and had a Foley catheter inserted 1 week ago.  Denies chest pain headache weakness or paresthesia.     Past Medical History:  Diagnosis Date  . COPD (chronic obstructive pulmonary disease) (HCC)   . Restless leg   . Stroke [facility])      Patient Active Problem List   Diagnosis Date Noted  . Sepsis (HCC) 06/12/2018  . Dysarthria 03/18/2018  . Poor balance 03/18/2018  . Status post CVA 03/18/2018  . BPH associated with nocturia 11/16/2017  . COPD, moderate (HCC) 07/02/2014     History reviewed. No pertinent surgical history.   Prior to Admission medications   Medication Sig Start Date End Date Taking? Authorizing Provider  albuterol (VENTOLIN HFA) 108 (90 Base) MCG/ACT inhaler Inhale 2 puffs into the lungs every 6 (six) hours as needed. 10/02/17  Yes [provider]  apixaban (ELIQUIS) 5 MG TABS tablet Take 5 mg by mouth 2 (two) times daily.  05/17/18  Yes [provider]  ferrous sulfate 325 (65 FE) MG tablet Take 1 tablet by mouth daily. 12/31/17 12/31/18 Yes [provider]  fluticasone furoate-vilanterol (BREO ELLIPTA) 100-25 MCG/INH AEPB Inhale 1 puff into the lungs  daily. 10/02/17  Yes [provider]  senna-docusate (SENOKOT-S) 8.6-50 MG tablet Take 1 tablet by mouth at bedtime as needed for moderate constipation. 02/28/18  Yes Altamese Dilling, MD  simvastatin (ZOCOR) 40 MG tablet Take 1 tablet (40 mg total) by mouth daily at 6 PM. 02/28/18  Yes Altamese Dilling, MD  tiotropium (SPIRIVA HANDIHALER) 18 MCG inhalation capsule Place 1 puff into inhaler and inhale daily. 10/02/17  Yes [provider]  aspirin EC 81 MG EC tablet Take 1 tablet (81 mg total) by mouth daily. Patient not taking: Reported on 06/12/2018 03/01/18   Altamese Dilling, MD  clopidogrel (PLAVIX) 75 MG tablet Take 1 tablet (75 mg total) by mouth daily. Patient not taking: Reported on 06/12/2018 03/01/18   Altamese Dilling, MD  finasteride (PROSCAR) 5 MG tablet Take 1 tablet (5 mg total) by mouth daily. Patient not taking: Reported on 06/12/2018 06/06/18   Michiel Cowboy A, PA-C  tamsulosin (FLOMAX) 0.4 MG CAPS capsule Take 1 capsule (0.4 mg total) by mouth daily. Patient not taking: Reported on 06/12/2018 06/06/18   Michiel Cowboy A, PA-C     Allergies Patient has no known allergies.   No family history on file.  Social History Social History   Tobacco Use  . Smoking status: Never Smoker  . Smokeless tobacco: Never Used  Substance Use Topics  . Alcohol use: Not Currently    Frequency: Never  . Drug use: Not on file    Review of Systems  Constitutional:   No fever or chills.  ENT:   No sore throat. No rhinorrhea. Cardiovascular:   No chest pain or syncope. Respiratory:   Positive shortness of breath without  cough. Gastrointestinal:   Negative for abdominal pain, vomiting and diarrhea.  Musculoskeletal:   Negative for focal pain or swelling All other systems reviewed and are negative except as documented above in ROS and HPI.  ____________________________________________   PHYSICAL EXAM:  VITAL SIGNS: ED Triage Vitals  Enc Vitals  Group     BP 06/12/18 1116 (!) 59/44     Pulse Rate 06/12/18 1136 87     Resp 06/12/18 1136 (!) 22     Temp 06/12/18 1136 (!) 96.3 F (35.7 C)     Temp Source 06/12/18 1136 Axillary     SpO2 06/12/18 1136 94 %     Weight 06/12/18 1134 153 lb (69.4 kg)     Height 06/12/18 1134 5\' 9"  (1.753 m)     Head Circumference --      Peak Flow --      Pain Score 06/12/18 1134 0     Pain Loc --      Pain Edu? --      Excl. in GC? --     Vital signs reviewed, nursing assessments reviewed.   Constitutional:   Alert and oriented.  Ill-appearing Eyes:   Conjunctivae are normal. EOMI. PERRL. ENT      Head:   Normocephalic and atraumatic.      Nose:   No congestion/rhinnorhea.       Mouth/Throat:   Very dry mucous membranes, no pharyngeal erythema. No peritonsillar mass.       Neck:   No meningismus. Full ROM. Hematological/Lymphatic/Immunilogical:   No cervical lymphadenopathy. Cardiovascular:   RRR. Symmetric bilateral radial and DP pulses.  No murmurs. Cap refill less than 2 seconds. Respiratory:   Normal respiratory effort without tachypnea/retractions. Breath sounds are clear and equal bilaterally. No wheezes/rales/rhonchi. Gastrointestinal:   Soft and nontender. Non distended. There is no CVA tenderness.  No rebound, rigidity, or guarding. Musculoskeletal:   Normal range of motion in all extremities. No joint effusions.  No lower extremity tenderness.  No edema. Neurologic:   Normal speech and language.  Motor grossly intact. No acute focal neurologic deficits are appreciated.  Skin:    Skin is warm, dry and intact. No rash noted.  No petechiae, purpura, or bullae.  ____________________________________________    LABS (pertinent positives/negatives) (all labs ordered are listed, but only abnormal results are displayed) Labs Reviewed  LACTIC ACID, PLASMA - Abnormal; Notable for the following components:      Result Value   Lactic Acid, Venous 2.0 (*)    All other components within  normal limits  COMPREHENSIVE METABOLIC PANEL - Abnormal; Notable for the following components:   Glucose, Bld 122 (*)    BUN 38 (*)    Creatinine, Ser 1.58 (*)    Calcium 8.1 (*)    Albumin 2.6 (*)    GFR calc non Af Amer 37 (*)    GFR calc Af Amer 43 (*)    All other components within normal limits  TROPONIN I - Abnormal; Notable for the following components:   Troponin I 0.03 (*)    All other components within normal limits  CBC WITH DIFFERENTIAL/PLATELET - Abnormal; Notable for the following components:   RBC 3.25 (*)    Hemoglobin 9.4 (*)    HCT 28.4 (*)    RDW 17.2 (*)  Neutro Abs 6.8 (*)    Lymphs Abs 0.9 (*)    All other components within normal limits  PROTIME-INR - Abnormal; Notable for the following components:   Prothrombin Time 21.0 (*)    All other components within normal limits  URINALYSIS, COMPLETE (UACMP) WITH MICROSCOPIC - Abnormal; Notable for the following components:   Color, Urine AMBER (*)    APPearance CLOUDY (*)    Hgb urine dipstick LARGE (*)    Protein, ur 100 (*)    Leukocytes, UA MODERATE (*)    RBC / HPF >50 (*)    WBC, UA >50 (*)    All other components within normal limits  CULTURE, BLOOD (ROUTINE X 2)  CULTURE, BLOOD (ROUTINE X 2)  URINE CULTURE  LACTIC ACID, PLASMA  LIPASE, BLOOD  APTT   ____________________________________________   EKG  Interpreted by me Sinus rhythm rate of 82, normal axis and intervals.  Left bundle branch block.  No acute ischemic changes.  ____________________________________________    RADIOLOGY  Dg Chest Port 1 View  Result Date: 06/12/2018 CLINICAL DATA:  Shortness of breath, hypertension. EXAM: PORTABLE CHEST 1 VIEW COMPARISON:  Radiographs of Mar 28, 2018. FINDINGS: Stable cardiomediastinal silhouette. No pneumothorax is noted. Right lung is clear. Mild left basilar atelectasis or scarring is noted in left lung base with possible minimal left pleural effusion. Bony thorax is unremarkable.  IMPRESSION: Mild left basilar atelectasis or scarring is noted in left lung base with possible minimal left pleural effusion. Electronically Signed   By: Lupita Raider, M.D.   On: 06/12/2018 11:59    ____________________________________________   PROCEDURES .Critical Care Performed by: Sharman Cheek, MD Authorized by: Sharman Cheek, MD   Critical care provider statement:    Critical care time (minutes):  35   Critical care time was exclusive of:  Separately billable procedures and treating other patients   Critical care was necessary to treat or prevent imminent or life-threatening deterioration of the following conditions:  Shock, sepsis and circulatory failure   Critical care was time spent personally by me on the following activities:  Development of treatment plan with patient or surrogate, discussions with consultants, evaluation of patient's response to treatment, examination of patient, obtaining history from patient or surrogate, ordering and performing treatments and interventions, ordering and review of laboratory studies, ordering and review of radiographic studies, pulse oximetry, re-evaluation of patient's condition and review of old charts    ____________________________________________  DIFFERENTIAL DIAGNOSIS   Sepsis, likely due to UTI versus pneumonia.  Doubt meningitis encephalitis or soft tissue infection.  Possible dehydration or pulmonary embolism.  CLINICAL IMPRESSION / ASSESSMENT AND PLAN / ED COURSE  Pertinent labs & imaging results that were available during my care of the patient were reviewed by me and considered in my medical decision making (see chart for details).      Clinical Course as of Jun 12 1645  Wed Jun 12, 2018  1129 P/w hypotension, hypothermia, likely UTI with recent foley cath insertion 1 week ago and h/o ur retention. Code sepsis. Cefepime IV now, 31ml/kg bolus   [PS]  1452 Labs reveal acute renal insufficiency with an elevated  lactate consistent with severe sepsis.  The hypotension indicate septic shock.  Urinalysis does show a clear UTI for which the patient has received cefepime.  Vital signs are improved and normalized.  Sepsis reassessment has been completed.   [PS]  1453 Greater than 10% lactate clearance on second lactate.  Lactic Acid, Venous: 1.7 [PS]  Clinical Course User Index [PS] Sharman Cheek, MD     ----------------------------------------- 4:48 PM on 06/12/2018 -----------------------------------------  Patient and family updated.  Case discussed with hospitalist for admission and further management.  ____________________________________________   FINAL CLINICAL IMPRESSION(S) / ED DIAGNOSES    Final diagnoses:  Acute cystitis without hematuria  Septic shock [facility])     ED Discharge Orders    None      Portions of this note were generated with dragon dictation software. Dictation errors may occur despite best attempts at proofreading.    Sharman Cheek, MD 06/12/18 [id number]

## 2018-06-13 ENCOUNTER — Inpatient Hospital Stay: Payer: Medicare Other

## 2018-06-13 ENCOUNTER — Encounter: Payer: Self-pay | Admitting: *Deleted

## 2018-06-13 ENCOUNTER — Ambulatory Visit: Payer: Medicare Other

## 2018-06-13 LAB — BASIC METABOLIC PANEL
Anion gap: 5 (ref 5–15)
BUN: 30 mg/dL — ABNORMAL HIGH (ref 8–23)
CALCIUM: 7.5 mg/dL — AB (ref 8.9–10.3)
CO2: 22 mmol/L (ref 22–32)
Chloride: 108 mmol/L (ref 98–111)
Creatinine, Ser: 1.37 mg/dL — ABNORMAL HIGH (ref 0.61–1.24)
GFR, EST AFRICAN AMERICAN: 51 mL/min — AB (ref >60)
GFR, EST NON AFRICAN AMERICAN: 44 mL/min — AB (ref >60)
Glucose, Bld: 75 mg/dL (ref 70–99)
Potassium: 3.4 mmol/L — ABNORMAL LOW (ref 3.5–5.1)
Sodium: 135 mmol/L (ref 135–145)

## 2018-06-13 LAB — CBC
HCT: 25.3 % — ABNORMAL LOW (ref 40.0–52.0)
HEMOGLOBIN: 8.5 g/dL — AB (ref 13.0–18.0)
MCH: 29.2 pg (ref 26.0–34.0)
MCHC: 33.4 g/dL (ref 32.0–36.0)
MCV: 87.3 fL (ref 80.0–100.0)
Platelets: 255 10*3/uL (ref 150–440)
RBC: 2.9 MIL/uL — AB (ref 4.40–5.90)
RDW: 17.3 % — ABNORMAL HIGH (ref 11.5–14.5)
WBC: 8.1 10*3/uL (ref 3.8–10.6)

## 2018-06-13 LAB — BLOOD CULTURE ID PANEL (REFLEXED)
ACINETOBACTER BAUMANNII: NOT DETECTED
CANDIDA ALBICANS: NOT DETECTED
CANDIDA GLABRATA: NOT DETECTED
CANDIDA KRUSEI: NOT DETECTED
Candida parapsilosis: NOT DETECTED
Candida tropicalis: NOT DETECTED
Carbapenem resistance: NOT DETECTED
ENTEROBACTER CLOACAE COMPLEX: NOT DETECTED
ENTEROCOCCUS SPECIES: NOT DETECTED
ESCHERICHIA COLI: DETECTED — AB
Enterobacteriaceae species: DETECTED — AB
Haemophilus influenzae: NOT DETECTED
Klebsiella oxytoca: NOT DETECTED
Klebsiella pneumoniae: NOT DETECTED
LISTERIA MONOCYTOGENES: NOT DETECTED
NEISSERIA MENINGITIDIS: NOT DETECTED
PROTEUS SPECIES: NOT DETECTED
Pseudomonas aeruginosa: NOT DETECTED
SERRATIA MARCESCENS: NOT DETECTED
STAPHYLOCOCCUS SPECIES: NOT DETECTED
STREPTOCOCCUS AGALACTIAE: NOT DETECTED
STREPTOCOCCUS PNEUMONIAE: NOT DETECTED
Staphylococcus aureus (BCID): NOT DETECTED
Streptococcus pyogenes: NOT DETECTED
Streptococcus species: NOT DETECTED

## 2018-06-13 MED ORDER — TAMSULOSIN HCL 0.4 MG PO CAPS
0.4000 mg | ORAL_CAPSULE | Freq: Every day | ORAL | Status: DC
  Administered 2018-06-13 – 2018-06-15 (×3): 0.4 mg via ORAL
  Filled 2018-06-13 (×3): qty 1

## 2018-06-13 MED ORDER — POTASSIUM CHLORIDE CRYS ER 20 MEQ PO TBCR
40.0000 meq | EXTENDED_RELEASE_TABLET | Freq: Once | ORAL | Status: AC
  Administered 2018-06-13: 40 meq via ORAL
  Filled 2018-06-13: qty 2

## 2018-06-13 MED ORDER — SODIUM CHLORIDE 0.9 % IV SOLN
1.0000 g | Freq: Two times a day (BID) | INTRAVENOUS | Status: DC
  Administered 2018-06-13 – 2018-06-14 (×5): 1 g via INTRAVENOUS
  Filled 2018-06-13 (×7): qty 1

## 2018-06-13 NOTE — Progress Notes (Signed)
Dr. Caryn Bee conferred with regarding clarification of VS orders; Acknowledged, routine VS. Windy Carina, RN 12:20 AM 06/13/2018

## 2018-06-13 NOTE — Progress Notes (Signed)
PHARMACY - PHYSICIAN COMMUNICATION CRITICAL VALUE ALERT - BLOOD CULTURE IDENTIFICATION (BCID)  Allen Potter is an 84 y.o. male who presented to [facility] on 06/12/2018 with a chief complaint of hypotension  Assessment:  Tmin 96.3, HR 100 - 110's, systolic 110's, LA 2.0, PCT < 2.44 x 1, UA leukocytes +, 1/4 GNR BCID Enterobacteriacaea E. Coli K[facility] -  Name of physician (or Provider) Contacted: Cammy Copa  Current antibiotics: Cefepime  Changes to prescribed antibiotics recommended:  Recommendations accepted by provider Will switch over to meropenem 1g IV q12h per CrCl 26 - 50 ml/min for possible ESBL E. Coli bacteremia  Results for orders placed or performed during the hospital encounter of 06/12/18  Blood Culture ID Panel (Reflexed) (Collected: 06/12/2018 11:30 AM)  Result Value Ref Range   Enterococcus species NOT DETECTED NOT DETECTED   Listeria monocytogenes NOT DETECTED NOT DETECTED   Staphylococcus species NOT DETECTED NOT DETECTED   Staphylococcus aureus NOT DETECTED NOT DETECTED   Streptococcus species NOT DETECTED NOT DETECTED   Streptococcus agalactiae NOT DETECTED NOT DETECTED   Streptococcus pneumoniae NOT DETECTED NOT DETECTED   Streptococcus pyogenes NOT DETECTED NOT DETECTED   Acinetobacter baumannii NOT DETECTED NOT DETECTED   Enterobacteriaceae species DETECTED (A) NOT DETECTED   Enterobacter cloacae complex NOT DETECTED NOT DETECTED   Escherichia coli DETECTED (A) NOT DETECTED   Klebsiella oxytoca NOT DETECTED NOT DETECTED   Klebsiella pneumoniae NOT DETECTED NOT DETECTED   Proteus species NOT DETECTED NOT DETECTED   Serratia marcescens NOT DETECTED NOT DETECTED   Carbapenem resistance NOT DETECTED NOT DETECTED   Haemophilus influenzae NOT DETECTED NOT DETECTED   Neisseria meningitidis NOT DETECTED NOT DETECTED   Pseudomonas aeruginosa NOT DETECTED NOT DETECTED   Candida albicans NOT DETECTED NOT DETECTED   Candida glabrata NOT DETECTED NOT DETECTED   Candida  krusei NOT DETECTED NOT DETECTED   Candida parapsilosis NOT DETECTED NOT DETECTED   Candida tropicalis NOT DETECTED NOT DETECTED   Thomasene Ripple, PharmD, BCPS Clinical Pharmacist 06/13/2018

## 2018-06-13 NOTE — Progress Notes (Signed)
[place] at [place] NAME: Allen Potter    MR#:  [id number]  DATE OF BIRTH:  Jun 23, 1927  SUBJECTIVE:  CHIEF COMPLAINT: Patient is feeling much better.  Denies any nausea vomiting.  Wife at bedside.  REVIEW OF SYSTEMS:  CONSTITUTIONAL: No fever, fatigue or weakness.  EYES: No blurred or double vision.  EARS, NOSE, AND THROAT: No tinnitus or ear pain.  RESPIRATORY: No cough, shortness of breath, wheezing or hemoptysis.  CARDIOVASCULAR: No chest pain, orthopnea, edema.  GASTROINTESTINAL: No nausea, vomiting, diarrhea or abdominal pain.  GENITOURINARY: No dysuria, hematuria.  ENDOCRINE: No polyuria, nocturia,  HEMATOLOGY: No anemia, easy bruising or bleeding SKIN: No rash or lesion. MUSCULOSKELETAL: No joint pain or arthritis.   NEUROLOGIC: No tingling, numbness, weakness.  PSYCHIATRY: No anxiety or depression.   DRUG ALLERGIES:  No Known Allergies  VITALS:  Blood pressure 92/72, pulse 90, temperature 97.9 F (36.6 C), temperature source Oral, resp. rate 18, height '5\' 9"'  (1.753 m), weight 64.4 kg, SpO2 95 %.  PHYSICAL EXAMINATION:  GENERAL:  84 y.o.-year-old patient lying in the bed with no acute distress.  EYES: Pupils equal, round, reactive to light and accommodation. No scleral icterus. Extraocular muscles intact.  HEENT: Head atraumatic, normocephalic. Oropharynx and nasopharynx clear.  NECK:  Supple, no jugular venous distention. No thyroid enlargement, no tenderness.  LUNGS: Normal breath sounds bilaterally, no wheezing, rales,rhonchi or crepitation. No use of accessory muscles of respiration.  CARDIOVASCULAR: S1, S2 normal. No murmurs, rubs, or gallops.  ABDOMEN: Soft, nontender, nondistended. Bowel sounds present. No organomegaly or mass.  EXTREMITIES: No pedal edema, cyanosis, or clubbing.  NEUROLOGIC: Cranial nerves II through XII are intact. Muscle strength globally weak in all extremities sensation intact. Gait not  checked.  PSYCHIATRIC: The patient is alert and oriented x 3.  SKIN: No obvious rash, lesion, or ulcer.    LABORATORY PANEL:   CBC Recent Labs  Lab 06/13/18 0423  WBC 8.1  HGB 8.5*  HCT 25.3*  PLT 255   ------------------------------------------------------------------------------------------------------------------  Chemistries  Recent Labs  Lab 06/12/18 1130  06/13/18 0423  NA 135  --  135  K 3.8  --  3.4*  CL 102  --  108  CO2 26  --  22  GLUCOSE 122*  --  75  BUN 38*  --  30*  CREATININE 1.58*   < > 1.37*  CALCIUM 8.1*  --  7.5*  AST 32  --   --   ALT 21  --   --   ALKPHOS 53  --   --   BILITOT 0.5  --   --    < > = values in this interval not displayed.   ------------------------------------------------------------------------------------------------------------------  Cardiac Enzymes Recent Labs  Lab 06/12/18 1130  TROPONINI 0.03*   ------------------------------------------------------------------------------------------------------------------  RADIOLOGY:  Dg Chest Port 1 View  Result Date: 06/12/2018 CLINICAL DATA:  Shortness of breath, hypertension. EXAM: PORTABLE CHEST 1 VIEW COMPARISON:  Radiographs of Mar 28, 2018. FINDINGS: Stable cardiomediastinal silhouette. No pneumothorax is noted. Right lung is clear. Mild left basilar atelectasis or scarring is noted in left lung base with possible minimal left pleural effusion. Bony thorax is unremarkable. IMPRESSION: Mild left basilar atelectasis or scarring is noted in left lung base with possible minimal left pleural effusion. Electronically Signed   By: Marijo Conception, M.D.   On: 06/12/2018 11:59    EKG:   Orders placed or performed during the hospital encounter of  06/12/18  . EKG 12-Lead  . EKG 12-Lead    ASSESSMENT AND PLAN:   #Sepsis with septic shock secondary to UTI Patient met septic criteria at the time of admission with lactic acidosis and hypotension Septic shock resolved Lactic acid  is improving Culture with gram-negative rods in 1 set of culture bottles another set of blood with negative growth so far Urine culture with Staphylococcus aureus  #Acute cystitis Urine culture with Staphylococcus IV meropenem while sensitivities are pending  #Acute kidney injury could be from urinary retention We will get renal ultrasound Baseline creatinine at 1.0 ,creatinine 1.58-1.34  #Urinary retention  Foley catheter placed and urology consult placed Will discharge patient with Foley catheter and follow-up with urology as an outpatient  #COPD stable no exacerbation continue breathing treatments as needed  #History of DVT continue Eliquis  #Hypokalemia replete and recheck in a.m.      All the records are reviewed and case discussed with Care Management/Social Workerr. Management plans discussed with the patient, family and they are in agreement.  CODE STATUS: fc  TOTAL TIME TAKING CARE OF THIS PATIENT: 19mnutes.   POSSIBLE D/C IN 2 DAYS, DEPENDING ON CLINICAL CONDITION.  Note: This dictation was prepared with Dragon dictation along with smaller phrase technology. Any transcriptional errors that result from this process are unintentional.   ANicholes MangoM.D on 06/13/2018 at 3:52 PM  Between 7am to 6pm - Pager - [phone] 6pm go to [email or web address] - password [email or web address]  Office  [phone] CC: Primary care physician; HTracie Harrier MD

## 2018-06-14 DIAGNOSIS — I69398 Other sequelae of cerebral infarction: Secondary | ICD-10-CM

## 2018-06-14 DIAGNOSIS — R42 Dizziness and giddiness: Secondary | ICD-10-CM

## 2018-06-14 DIAGNOSIS — Z9181 History of falling: Secondary | ICD-10-CM

## 2018-06-14 DIAGNOSIS — Z86718 Personal history of other venous thrombosis and embolism: Secondary | ICD-10-CM

## 2018-06-14 DIAGNOSIS — Z7902 Long term (current) use of antithrombotics/antiplatelets: Secondary | ICD-10-CM

## 2018-06-14 DIAGNOSIS — R7881 Bacteremia: Secondary | ICD-10-CM

## 2018-06-14 DIAGNOSIS — Z960 Presence of urogenital implants: Secondary | ICD-10-CM

## 2018-06-14 DIAGNOSIS — R339 Retention of urine, unspecified: Secondary | ICD-10-CM

## 2018-06-14 DIAGNOSIS — J9811 Atelectasis: Secondary | ICD-10-CM

## 2018-06-14 DIAGNOSIS — R8271 Bacteriuria: Secondary | ICD-10-CM

## 2018-06-14 DIAGNOSIS — J449 Chronic obstructive pulmonary disease, unspecified: Secondary | ICD-10-CM

## 2018-06-14 DIAGNOSIS — B9561 Methicillin susceptible Staphylococcus aureus infection as the cause of diseases classified elsewhere: Secondary | ICD-10-CM

## 2018-06-14 DIAGNOSIS — R6883 Chills (without fever): Secondary | ICD-10-CM

## 2018-06-14 DIAGNOSIS — R319 Hematuria, unspecified: Secondary | ICD-10-CM

## 2018-06-14 DIAGNOSIS — E785 Hyperlipidemia, unspecified: Secondary | ICD-10-CM

## 2018-06-14 DIAGNOSIS — I959 Hypotension, unspecified: Secondary | ICD-10-CM

## 2018-06-14 DIAGNOSIS — R32 Unspecified urinary incontinence: Secondary | ICD-10-CM

## 2018-06-14 DIAGNOSIS — Z79899 Other long term (current) drug therapy: Secondary | ICD-10-CM

## 2018-06-14 DIAGNOSIS — Z7901 Long term (current) use of anticoagulants: Secondary | ICD-10-CM

## 2018-06-14 DIAGNOSIS — B9620 Unspecified Escherichia coli [E. coli] as the cause of diseases classified elsewhere: Secondary | ICD-10-CM

## 2018-06-14 LAB — URINE CULTURE: Culture: >=100000 — AB

## 2018-06-14 LAB — POTASSIUM: Potassium: 4.6 mmol/L (ref 3.5–5.1)

## 2018-06-14 MED ORDER — POTASSIUM CHLORIDE CRYS ER 20 MEQ PO TBCR: 20.0000 meq | EXTENDED_RELEASE_TABLET | Freq: Once | ORAL | Status: DC

## 2018-06-14 NOTE — Evaluation (Signed)
Physical Therapy Evaluation Patient Details Name: Allen Potter MRN: [id number] DOB: June 19, 1927 Today's Date: 06/14/2018   History of Present Illness   84 y.o. male with a known history of COPD, stroke, hyperlipidemia, restless leg, BPH and urinary retention.  Admitted with sepsis and generalized weakness and dizziness.   Clinical Impression  Pt eager to work with PT and very pleasant t/o the exam.  He has some baseline weakness (remote CVAs) but is functional t/o and was able to ambulate around the nurses' station (~10 minutes of gait training/cuing/edu apart from PT exam).  Pt is not at his baseline but should be able to return home with HHPT once medically cleared.      Follow Up Recommendations Home health PT    Equipment Recommendations  None recommended by PT    Recommendations for Other Services       Precautions / Restrictions Precautions Precautions: Fall Restrictions Weight Bearing Restrictions: No      Mobility  Bed Mobility Overal bed mobility: Modified Independent             General bed mobility comments: Pt able to get to sitting at EOB w/o assist  Transfers Overall transfer level: Modified independent Equipment used: Quad cane             General transfer comment: Pt able to get to standing w/o direct assist, reliant on cane but confident  Ambulation/Gait Ambulation/Gait assistance: Min guard Gait Distance (Feet): 200 Feet Assistive device: Quad cane(uses in (weaker) R hand)       General Gait Details: Pt with slow but consistent circumambulation of the nurses' station.  He did fatigue with the effort and reports not being near his baseline but ultimately he did not have any LOBs or vert safety issues.   Cuing and gait training for AD use, cadence, foot placement  Stairs            Wheelchair Mobility    Modified Rankin (Stroke Patients Only)       Balance Overall balance assessment: Modified Independent                                            Pertinent Vitals/Pain Pain Assessment: No/denies pain    Home Living Family/patient expects to be discharged to:: Private residence Living Arrangements: Spouse/significant other Available Help at Discharge: Family;Available 24 hours/day Type of Home: House       Home Layout: One level Home Equipment: Walker - 2 wheels;Cane - quad;Walker - standard      Prior Function Level of Independence: Independent with assistive device(s)         Comments: Pt reports that he is out of the home somewhat regularly, but does not drive or do heavy yard/house work     Higher education careers adviser        Extremity/Trunk Assessment   Upper Extremity Assessment Upper Extremity Assessment: Generalized weakness(R weaker/less ROM than L, grossly functional)    Lower Extremity Assessment Lower Extremity Assessment: Generalized weakness(R weaker/less ROM than L, grossly functional)       Communication   Communication: No difficulties  Cognition Arousal/Alertness: Awake/alert Behavior During Therapy: WFL for tasks assessed/performed Overall Cognitive Status: Within Functional Limits for tasks assessed  General Comments      Exercises     Assessment/Plan    PT Assessment Patient needs continued PT services  PT Problem List Decreased strength;Decreased range of motion;Decreased activity tolerance;Decreased balance;Decreased mobility;Decreased coordination;Decreased knowledge of use of DME;Decreased safety awareness       PT Treatment Interventions DME instruction;Gait training;Functional mobility training;Therapeutic activities;Balance training;Therapeutic exercise;Neuromuscular re-education;Patient/family education    PT Goals (Current goals can be found in the Care Plan section)  Acute Rehab PT Goals Patient Stated Goal: go home PT Goal Formulation: With patient Time For Goal Achievement:  06/28/18 Potential to Achieve Goals: Good    Frequency Min 2X/week   Barriers to discharge        Co-evaluation               AM-PAC PT "6 Clicks" Daily Activity  Outcome Measure Difficulty turning over in bed (including adjusting bedclothes, sheets and blankets)?: None Difficulty moving from lying on back to sitting on the side of the bed? : None Difficulty sitting down on and standing up from a chair with arms (e.g., wheelchair, bedside commode, etc,.)?: A Little Help needed moving to and from a bed to chair (including a wheelchair)?: None Help needed walking in hospital room?: None Help needed climbing 3-5 steps with a railing? : A Little 6 Click Score: 22    End of Session Equipment Utilized During Treatment: Gait belt Activity Tolerance: Patient tolerated treatment well Patient left: with chair alarm set;with call bell/phone within reach Nurse Communication: Mobility status PT Visit Diagnosis: Muscle weakness (generalized) (M62.81);Difficulty in walking, not elsewhere classified (R26.2)    Time: 1610-9604 PT Time Calculation (min) (ACUTE ONLY): 23 min   Charges:   PT Evaluation $PT Eval Low Complexity: 1 Low PT Treatments $Gait Training: 8-22 mins        Malachi Pro, DPT 06/14/2018, 5:34 PM

## 2018-06-14 NOTE — Care Management (Signed)
Patient admitted from home with sepsis.  Patient lives at home with wife.  PCP Hande.  PT consult pending. Patient previously discharged to [facility] in May of 2019

## 2018-06-14 NOTE — Progress Notes (Signed)
[place] at [place] NAME: Allen Potter    MR#:  [id number]  DATE OF BIRTH:  11-14-1926  SUBJECTIVE:  CHIEF COMPLAINT: Patient is feeling much better.  Reports some weakness, denies any nausea vomiting.  Wife at bedside.  REVIEW OF SYSTEMS:  CONSTITUTIONAL: No fever, fatigue or weakness.  EYES: No blurred or double vision.  EARS, NOSE, AND THROAT: No tinnitus or ear pain.  RESPIRATORY: No cough, shortness of breath, wheezing or hemoptysis.  CARDIOVASCULAR: No chest pain, orthopnea, edema.  GASTROINTESTINAL: No nausea, vomiting, diarrhea or abdominal pain.  GENITOURINARY: No dysuria, hematuria.  ENDOCRINE: No polyuria, nocturia,  HEMATOLOGY: No anemia, easy bruising or bleeding SKIN: No rash or lesion. MUSCULOSKELETAL: No joint pain or arthritis.   NEUROLOGIC: No tingling, numbness, weakness.  PSYCHIATRY: No anxiety or depression.   DRUG ALLERGIES:  No Known Allergies  VITALS:  Blood pressure 110/76, pulse 91, temperature 98 F (36.7 C), temperature source Oral, resp. rate 16, height _0  (1.753 m), weight 64.4 kg, SpO2 98 %.  PHYSICAL EXAMINATION:  GENERAL:  84 y.o.-year-old patient lying in the bed with no acute distress.  EYES: Pupils equal, round, reactive to light and accommodation. No scleral icterus. Extraocular muscles intact.  HEENT: Head atraumatic, normocephalic. Oropharynx and nasopharynx clear.  NECK:  Supple, no jugular venous distention. No thyroid enlargement, no tenderness.  LUNGS: Normal breath sounds bilaterally, no wheezing, rales,rhonchi or crepitation. No use of accessory muscles of respiration.  CARDIOVASCULAR: S1, S2 normal. No murmurs, rubs, or gallops.  ABDOMEN: Soft, nontender, nondistended. Bowel sounds present. No organomegaly or mass.  EXTREMITIES: No pedal edema, cyanosis, or clubbing.  NEUROLOGIC: Cranial nerves II through XII are intact. Muscle strength globally weak in all extremities  sensation intact. Gait not checked.  PSYCHIATRIC: The patient is alert and oriented x 3.  SKIN: No obvious rash, lesion, or ulcer.    LABORATORY PANEL:   CBC Recent Labs  Lab 06/13/18 0423  WBC 8.1  HGB 8.5*  HCT 25.3*  PLT 255   ------------------------------------------------------------------------------------------------------------------  Chemistries  Recent Labs  Lab 06/12/18 1130  06/13/18 0423  NA 135  --  135  K 3.8  --  3.4*  CL 102  --  108  CO2 26  --  22  GLUCOSE 122*  --  75  BUN 38*  --  30*  CREATININE 1.58*   < > 1.37*  CALCIUM 8.1*  --  7.5*  AST 32  --   --   ALT 21  --   --   ALKPHOS 53  --   --   BILITOT 0.5  --   --    < > = values in this interval not displayed.   ------------------------------------------------------------------------------------------------------------------  Cardiac Enzymes Recent Labs  Lab 06/12/18 1130  TROPONINI 0.03*   ------------------------------------------------------------------------------------------------------------------  RADIOLOGY:  US Renal  Result Date: 06/13/2018 CLINICAL DATA:  Acute kidney injury EXAM: RENAL / URINARY TRACT ULTRASOUND COMPLETE COMPARISON:  None. FINDINGS: Right Kidney: Length: 9.5 cm. 2.3 cm exophytic midpole cyst. Mild cortical thinning and increased echotexture. No hydronephrosis. Left Kidney: Length: 9.2 cm. 2.1 cm exophytic upper pole cyst . Increased echotexture and mild cortical thinning. No hydronephrosis. Bladder: Decompressed with Foley catheter in place. IMPRESSION: No acute findings.  No hydronephrosis. Mildly increased echotexture and cortical thinning compatible with chronic medical renal disease. Electronically Signed   By: Rolm Baptise M.D.   On: 06/13/2018 17:35    EKG:   Orders  placed or performed during the hospital encounter of 06/12/18  . EKG 12-Lead  . EKG 12-Lead    ASSESSMENT AND PLAN:   #Sepsis with septic shock secondary to UTI Patient met septic  criteria at the time of admission with lactic acidosis and hypotension Septic shock resolved Lactic acid is improving Culture with gram-negative rods E. coli in 1 set of culture bottles another set of blood with negative growth so far Urine culture with Staphylococcus aureus sensitive to neutrophil) and Bactrim Continue meropenem for now Consult placed infectious disease  #Acute cystitis Urine culture with Staphylococcus   #Acute kidney injury could be from urinary retention  renal ultrasound-no acute findings, no hydronephrosis Baseline creatinine at 1.0 ,creatinine 1.58-1.34-1.37 Discussed with on-call urologist Dr. Lovena Neighbours who is recommending outpatient follow-up with [facility] urology after discharge.  Patient will be discharged with Foley catheter and leg bag until he is seen by urology  #Urinary retention  Foley catheter placed  Will discharge patient with Foley catheter and follow-up with urology as an outpatient, patient is agreeable  #COPD stable no exacerbation continue breathing treatments as needed  #History of DVT continue Eliquis  #Hypokalemia replete and recheck in a.m.      All the records are reviewed and case discussed with Care Management/Social Workerr. Management plans discussed with the patient, family and they are in agreement.  CODE STATUS: fc  TOTAL TIME TAKING CARE OF THIS PATIENT: 52mnutes.   POSSIBLE D/C IN 2 DAYS, DEPENDING ON CLINICAL CONDITION.  Note: This dictation was prepared with Dragon dictation along with smaller phrase technology. Any transcriptional errors that result from this process are unintentional.   ANicholes MangoM.D on 06/14/2018 at 1:10 PM  Between 7am to 6pm - Pager - [phone] 6pm go to [email or web address] - password [email or web address]  Office  [phone] CC: Primary care physician; HTracie Harrier MD

## 2018-06-14 NOTE — Consult Note (Signed)
Date of Admission:  06/12/2018               Reason for Consult:  bacteremia and UTI    Referring Provider: Dr. Amado Coe   Active Problems:   Sepsis [facility])    HPI: Allen Potter is a 84 y.o. male with a history of CVA, hyperlipidemia, COPD urinary retention with placement of Foley a week ago presents with dizziness and low BP.  Patient says that he has been feeling dizzy.  [place] and fallen down but was helped to his car and drove back to home.  He then went to see his PCP and when he got out of the car he was again feeling dizzy and his blood pressure was found to be 64 / 38 and Dr. Marcello Fennel  sent him to the emergency department.   Patient  developed urinary incontinence since his stroke in April 2019 and he saw urology on 06/06/2018 and was initiated on Flomax and Proscar and and a Foley catheter was placed for urinary retention and he was supposed to see them tomorrow.  Patient is already been suffering from syncope and dizziness even before the finasteride and tamsulosin was started.  Patient is also seen cardiology for syncope. Patient denies any fever or chills or abdominal pain.  He did notice bloody urine.  He did not have any diarrhea or cough or shortness of breath.  He uses a cane to walk.  He still drives his car. In the ED his temp was 96.3, BP was 59/44. WBC was 8.1 Blood culture sent and he was started on cefepime  Past medical history COPD DVT Hyperlipidemia Restless leg syndrome Stroke Dizziness and syncope   Past Surgical History:  Procedure Laterality Date  . CATARACT EXTRACTION      Social History   Tobacco Use  . Smoking status: Never Smoker  . Smokeless tobacco: Never Used  Substance Use Topics  . Alcohol use: Not Currently    Frequency: Never  . Drug use: Not on file    Patient does not remember medical  history of his mother and father.  Home medications Apixaban ferrous  sulfate Finasteride Fluticasone Simvastatin Tamsulosin Spiriva Plavix  Hospital meds  . apixaban  5 mg Oral BID  . fluticasone furoate-vilanterol  1 puff Inhalation Daily  . simvastatin  40 mg Oral q1800  . tamsulosin  0.4 mg Oral Daily  . tiotropium  18 mcg Inhalation Daily      Abtx:  Anti-infectives (From admission, onward)   Start     Dose/Rate Route Frequency Ordered Stop   06/13/18 1000  ceFEPIme (MAXIPIME) 2 g in sodium chloride 0.9 % 100 mL IVPB  Status:  Discontinued     2 g 200 mL/hr over 30 Minutes Intravenous Every 24 hours 06/12/18 1654 06/13/18 0106   06/13/18 0115  meropenem (MERREM) 1 g in sodium chloride 0.9 % 100 mL IVPB     1 g 200 mL/hr over 30 Minutes Intravenous Every 12 hours 06/13/18 0106     06/12/18 1130  ceFEPIme (MAXIPIME) 2 g in sodium chloride 0.9 % 100 mL IVPB     2 g 200 mL/hr over 30 Minutes Intravenous  Once 06/12/18 1129 06/12/18 1224       Review of Systems: Review of Systems  Constitutional: Positive for chills and malaise/fatigue. Negative for fever and weight loss.  HENT: Negative for congestion, ear pain and sore throat.  Eyes: Negative for blurred vision and double vision.  Respiratory: Negative for cough and shortness of breath.   Cardiovascular: Negative for chest pain, palpitations and leg swelling.  Gastrointestinal: Negative for abdominal pain, nausea and vomiting.  Genitourinary: Positive for frequency (has a foley for urinary retention with overflow) and hematuria. Negative for flank pain.  Musculoskeletal: Positive for falls.  Neurological: Positive for dizziness and weakness. Negative for headaches.  Endo/Heme/Allergies: Bruises/bleeds easily.  Psychiatric/Behavioral: Negative for depression.      OBJECTIVE: Blood pressure 110/76, pulse 91, temperature 98 F (36.7 C), temperature source Oral, resp. rate 16, height 5\' 9"  (1.753 m), weight 64.4 kg, SpO2 98 %.  Physical Exam Constitutional- awake and alert and  oriented X5 Eyes full range of eyemovts- PERL ENT-Tongue coated No maxillary tenderness CVS-s1s2, no murmur RS-clear - no crepts GI-soft, no tenderness GU- foley in place MSK- no edema legs- full range of movt SKIN-no rash= changes of sun exposure Neurologic-non focal Psychiatric-mood stable Lymphatic-no lymph nodes palpable  Lab Results CBC    Component Value Date/Time   WBC 8.1 06/13/2018 0423   RBC 2.90 (L) 06/13/2018 0423   HGB 8.5 (L) 06/13/2018 0423   HCT 25.3 (L) 06/13/2018 0423   PLT 255 06/13/2018 0423   MCV 87.3 06/13/2018 0423   MCH 29.2 06/13/2018 0423   MCHC 33.4 06/13/2018 0423   RDW 17.3 (H) 06/13/2018 0423   LYMPHSABS 0.9 (L) 06/12/2018 1130   MONOABS 0.9 06/12/2018 1130   EOSABS 0.3 06/12/2018 1130   BASOSABS 0.0 06/12/2018 1130    CMP Latest Ref Rng & Units 06/14/2018 06/13/2018 06/12/2018  Glucose 70 - 99 mg/dL - 75 -  BUN 8 - 23 mg/dL - 16(X) -  Creatinine 0.96 - 1.24 mg/dL - 0.45(W) 0.98(J)  Sodium 135 - 145 mmol/L - 135 -  Potassium 3.5 - 5.1 mmol/L 4.6 3.4(L) -  Chloride 98 - 111 mmol/L - 108 -  CO2 22 - 32 mmol/L - 22 -  Calcium 8.9 - 10.3 mg/dL - 7.5(L) -  Total Protein 6.5 - 8.1 g/dL - - -  Total Bilirubin 0.3 - 1.2 mg/dL - - -  Alkaline Phos 38 - 126 U/L - - -  AST 15 - 41 U/L - - -  ALT 0 - 44 U/L - - -      Microbiology: Recent Results (from the past 240 hour(s))  Blood Culture (routine x 2)     Status: None (Preliminary result)   Collection Time: 06/12/18 11:30 AM  Result Value Ref Range Status   Specimen Description BLOOD LEFT WRIST  Final   Special Requests   Final    BOTTLES DRAWN AEROBIC AND ANAEROBIC Blood Culture adequate volume   Culture   Final    NO GROWTH 2 DAYS Performed at [facility], [address] [place], [place] [address]    Report Status PENDING  Incomplete  Blood Culture (routine x 2)     Status: Abnormal (Preliminary result)   Collection Time: 06/12/18 11:30 AM  Result Value Ref Range Status    Specimen Description   Final    BLOOD RIGHT [facility] Performed at [facility], [address], [place], [place] [address]    Special Requests   Final    BOTTLES DRAWN AEROBIC AND ANAEROBIC Blood Culture adequate volume Performed at [facility], [address] [place], [place] [address]    Culture  Setup Time   Final    GRAM NEGATIVE RODS AEROBIC BOTTLE  ONLY CRITICAL RESULT CALLED TO, READ BACK BY AND VERIFIED WITH: DAVID BESANTI ON 06/13/18 AT 0045 JAG    Culture (A)  Final    ESCHERICHIA COLI SUSCEPTIBILITIES TO FOLLOW Performed at [facility], [address] [place], [place] [address]    Report Status PENDING  Incomplete  Blood Culture ID Panel (Reflexed)     Status: Abnormal   Collection Time: 06/12/18 11:30 AM  Result Value Ref Range Status   Enterococcus species NOT DETECTED NOT DETECTED Final   Listeria monocytogenes NOT DETECTED NOT DETECTED Final   Staphylococcus species NOT DETECTED NOT DETECTED Final   Staphylococcus aureus NOT DETECTED NOT DETECTED Final   Streptococcus species NOT DETECTED NOT DETECTED Final   Streptococcus agalactiae NOT DETECTED NOT DETECTED Final   Streptococcus pneumoniae NOT DETECTED NOT DETECTED Final   Streptococcus pyogenes NOT DETECTED NOT DETECTED Final   Acinetobacter baumannii NOT DETECTED NOT DETECTED Final   Enterobacteriaceae species DETECTED (A) NOT DETECTED Final    Comment: Enterobacteriaceae represent a large family of gram-negative bacteria, not a single organism. CRITICAL RESULT CALLED TO, READ BACK BY AND VERIFIED WITH: DAVID BESANTI ON 06/13/18 AT 0045 JAG    Enterobacter cloacae complex NOT DETECTED NOT DETECTED Final   Escherichia coli DETECTED (A) NOT DETECTED Final    Comment: CRITICAL RESULT CALLED TO, READ BACK BY AND VERIFIED WITH: DAVID BESANTI ON 06/13/18 AT 0045 BY JAG    Klebsiella oxytoca NOT DETECTED NOT DETECTED Final   Klebsiella pneumoniae NOT DETECTED NOT DETECTED Final    Proteus species NOT DETECTED NOT DETECTED Final   Serratia marcescens NOT DETECTED NOT DETECTED Final   Carbapenem resistance NOT DETECTED NOT DETECTED Final   Haemophilus influenzae NOT DETECTED NOT DETECTED Final   Neisseria meningitidis NOT DETECTED NOT DETECTED Final   Pseudomonas aeruginosa NOT DETECTED NOT DETECTED Final   Candida albicans NOT DETECTED NOT DETECTED Final   Candida glabrata NOT DETECTED NOT DETECTED Final   Candida krusei NOT DETECTED NOT DETECTED Final   Candida parapsilosis NOT DETECTED NOT DETECTED Final   Candida tropicalis NOT DETECTED NOT DETECTED Final    Comment: Performed at [facility], [address], [place], [place] [address]  Urine culture     Status: Abnormal   Collection Time: 06/12/18 12:26 PM  Result Value Ref Range Status   Specimen Description   Final    URINE, RANDOM Performed at [facility], [address], [place], [place] [address]    Special Requests   Final    NONE Performed at [facility], [address] [place], [place] [address]    Culture >=100,000 COLONIES/mL STAPHYLOCOCCUS AUREUS (A)  Final   Report Status 06/14/2018 FINAL  Final   Organism ID, Bacteria STAPHYLOCOCCUS AUREUS (A)  Final      Susceptibility   Staphylococcus aureus - MIC*    CIPROFLOXACIN >=8 RESISTANT Resistant     GENTAMICIN <=0.5 SENSITIVE Sensitive     NITROFURANTOIN <=16 SENSITIVE Sensitive     OXACILLIN <=0.25 SENSITIVE Sensitive     TETRACYCLINE <=1 SENSITIVE Sensitive     VANCOMYCIN <=0.5 SENSITIVE Sensitive     TRIMETH/SULFA <=10 SENSITIVE Sensitive     CLINDAMYCIN <=0.25 SENSITIVE Sensitive     RIFAMPIN <=0.5 SENSITIVE Sensitive     Inducible Clindamycin NEGATIVE Sensitive     * >=100,000 COLONIES/mL STAPHYLOCOCCUS AUREUS     Mild left basilar atelectasis or scarring is noted in left lung base with possible minimal left pleural effusion.  [place] no hydronephrosis Radiographs and labs were personally reviewed by me.    Assessment and Plan JOREL GRAVLIN is a 84 y.o. male with a history of CVA, hyperlipidemia, COPD urinary retention with placement of Foley a week ago presents with dizziness and low BP.  E. coli bacteremia likely source is a urinary tract even though the urine culture is positive for Staphylococcus aureus.  But there is no Staphylococcus from the blood culture.  So very likely the staph aureus in the urine is secondary to the Foley catheter and possibly a contamination. Patient is currently on meropenem.  He is not been on any antibiotics recently and doubt this would be a multidrug-resistant organism but would still wait for the final ID/Sensi  before de-escalation.  No hydronephrosis seen  Staph aureus in the urine.   staph aureus is not a common organism to cause urinary tract infection when found in the urine we always need to rule out Staphylococcus aureus bacteremia.  This patient does not have any staph aureus bacteremia.  So  this is due to the Foley which was placed a week ago and likely a contaminant .The urine catheter was changed in the ED on 1114 and looks like the sample was sent after changing the catheter.  Currently the meropenem will be treating the staph aureus as well    Severe hypotension .  Even though he has E. coli bacteremia and likely this is playing a role in the hypotension but I believe that the recent initiation of tamsulosin and finasteride on 06/06/2018 is playing a role here.  There could be an element of dehydration as well  Urinary retention with overflow leading to Foley catheter placement by urology on 06/06/2018 and changed in the ED.  CVA was on plavix  DVT  on Eliquis  Discussed the management with patient ID will follow peripherally this weekend Call if needed  Lynn Ito, MD  06/14/2018, 5:50 PM

## 2018-06-14 NOTE — Progress Notes (Signed)
[facility] [place] NAME: Allen Potter    MR#:  [id number]  DATE OF BIRTH:  11-14-1926  SUBJECTIVE:  CHIEF COMPLAINT: Patient is feeling much better.  Reports some weakness, denies any nausea vomiting.  Wife at bedside.  REVIEW OF SYSTEMS:  CONSTITUTIONAL: No fever, fatigue or weakness.  EYES: No blurred or double vision.  EARS, NOSE, AND THROAT: No tinnitus or ear pain.  RESPIRATORY: No cough, shortness of breath, wheezing or hemoptysis.  CARDIOVASCULAR: No chest pain, orthopnea, edema.  GASTROINTESTINAL: No nausea, vomiting, diarrhea or abdominal pain.  GENITOURINARY: No dysuria, hematuria.  ENDOCRINE: No polyuria, nocturia,  HEMATOLOGY: No anemia, easy bruising or bleeding SKIN: No rash or lesion. MUSCULOSKELETAL: No joint pain or arthritis.   NEUROLOGIC: No tingling, numbness, weakness.  PSYCHIATRY: No anxiety or depression.   DRUG ALLERGIES:  No Known Allergies  VITALS:  Blood pressure 110/76, pulse 91, temperature 98 F (36.7 C), temperature source Oral, resp. rate 16, height _0  (1.753 m), weight 64.4 kg, SpO2 98 %.  PHYSICAL EXAMINATION:  GENERAL:  84 y.o.-year-old patient lying in the bed with no acute distress.  EYES: Pupils equal, round, reactive to light and accommodation. No scleral icterus. Extraocular muscles intact.  HEENT: Head atraumatic, normocephalic. Oropharynx and nasopharynx clear.  NECK:  Supple, no jugular venous distention. No thyroid enlargement, no tenderness.  LUNGS: Normal breath sounds bilaterally, no wheezing, rales,rhonchi or crepitation. No use of accessory muscles of respiration.  CARDIOVASCULAR: S1, S2 normal. No murmurs, rubs, or gallops.  ABDOMEN: Soft, nontender, nondistended. Bowel sounds present. No organomegaly or mass.  EXTREMITIES: No pedal edema, cyanosis, or clubbing.  NEUROLOGIC: Cranial nerves II through XII are intact. Muscle strength globally weak in all extremities  sensation intact. Gait not checked.  PSYCHIATRIC: The patient is alert and oriented x 3.  SKIN: No obvious rash, lesion, or ulcer.    LABORATORY PANEL:   CBC Recent Labs  Lab 06/13/18 0423  WBC 8.1  HGB 8.5*  HCT 25.3*  PLT 255   ------------------------------------------------------------------------------------------------------------------  Chemistries  Recent Labs  Lab 06/12/18 1130  06/13/18 0423  NA 135  --  135  K 3.8  --  3.4*  CL 102  --  108  CO2 26  --  22  GLUCOSE 122*  --  75  BUN 38*  --  30*  CREATININE 1.58*   < > 1.37*  CALCIUM 8.1*  --  7.5*  AST 32  --   --   ALT 21  --   --   ALKPHOS 53  --   --   BILITOT 0.5  --   --    < > = values in this interval not displayed.   ------------------------------------------------------------------------------------------------------------------  Cardiac Enzymes Recent Labs  Lab 06/12/18 1130  TROPONINI 0.03*   ------------------------------------------------------------------------------------------------------------------  RADIOLOGY:  US Renal  Result Date: 06/13/2018 CLINICAL DATA:  Acute kidney injury EXAM: RENAL / URINARY TRACT ULTRASOUND COMPLETE COMPARISON:  None. FINDINGS: Right Kidney: Length: 9.5 cm. 2.3 cm exophytic midpole cyst. Mild cortical thinning and increased echotexture. No hydronephrosis. Left Kidney: Length: 9.2 cm. 2.1 cm exophytic upper pole cyst . Increased echotexture and mild cortical thinning. No hydronephrosis. Bladder: Decompressed with Foley catheter in place. IMPRESSION: No acute findings.  No hydronephrosis. Mildly increased echotexture and cortical thinning compatible with chronic medical renal disease. Electronically Signed   By: Rolm Baptise M.D.   On: 06/13/2018 17:35    EKG:   Orders  placed or performed during the hospital encounter of 06/12/18  . EKG 12-Lead  . EKG 12-Lead    ASSESSMENT AND PLAN:   #Sepsis with septic shock secondary to UTI Patient met septic  criteria at the time of admission with lactic acidosis and hypotension Septic shock resolved Lactic acid is improving Culture with gram-negative rods E. coli in 1 set of culture bottles another set of blood with negative growth so far Urine culture with Staphylococcus aureus sensitive to neutrophil) and Bactrim Continue meropenem for now Consult placed infectious disease  #Acute cystitis Urine culture with Staphylococcus   #Acute kidney injury could be from urinary retention  renal ultrasound-no acute findings, no hydronephrosis Baseline creatinine at 1.0 ,creatinine 1.58-1.34-1.37 Discussed with on-call urologist Dr. Lovena Neighbours who is recommending outpatient follow-up with [facility] urology after discharge.  Patient will be discharged with Foley catheter and leg bag until he is seen by urology  #Urinary retention  Foley catheter placed  Will discharge patient with Foley catheter and follow-up with urology as an outpatient, patient is agreeable  #COPD stable no exacerbation continue breathing treatments as needed  #History of DVT continue Eliquis  #Hypokalemia replete and recheck in a.m.      All the records are reviewed and case discussed with Care Management/Social Workerr. Management plans discussed with the patient, family and they are in agreement.  CODE STATUS: fc  TOTAL TIME TAKING CARE OF THIS PATIENT: 3mnutes.   POSSIBLE D/C IN 2 DAYS, DEPENDING ON CLINICAL CONDITION.  Note: This dictation was prepared with Dragon dictation along with smaller phrase technology. Any transcriptional errors that result from this process are unintentional.   ANicholes MangoM.D on 06/14/2018 at 12:49 PM  Between 7am to 6pm - Pager - [phone] 6pm go to [email or web address] - password [email or web address]  Office  [phone] CC: Primary care physician; HTracie Harrier MD

## 2018-06-14 NOTE — Care Management Important Message (Signed)
Copy of signed IM left with patient and family in room. 

## 2018-06-15 LAB — CULTURE, BLOOD (ROUTINE X 2): Special Requests: ADEQUATE

## 2018-06-15 MED ORDER — LEVOFLOXACIN 750 MG PO TABS: 750.0000 mg | ORAL_TABLET | ORAL | 0 refills | Status: DC

## 2018-06-15 MED ORDER — ACETAMINOPHEN 325 MG PO TABS: 650.0000 mg | ORAL_TABLET | Freq: Four times a day (QID) | ORAL | Status: DC | PRN

## 2018-06-15 MED ORDER — RISAQUAD PO CAPS: 1.0000 | ORAL_CAPSULE | Freq: Every day | ORAL | 0 refills | Status: DC

## 2018-06-15 MED ORDER — LEVOFLOXACIN 750 MG PO TABS
750.0000 mg | ORAL_TABLET | ORAL | Status: DC
  Administered 2018-06-15: 750 mg via ORAL
  Filled 2018-06-15: qty 1

## 2018-06-15 MED ORDER — RISAQUAD PO CAPS
1.0000 | ORAL_CAPSULE | Freq: Every day | ORAL | Status: DC
  Administered 2018-06-15: 1 via ORAL
  Filled 2018-06-15: qty 1

## 2018-06-15 NOTE — Progress Notes (Signed)
Discharge teaching given to patient, patient verbalized understanding and had no questions. Patient IV removed. Patient will be transported home by family. All patient belongings gathered prior to leaving.  

## 2018-06-15 NOTE — Care Management Note (Signed)
Case Management Note  Patient Details  Name: KNOXTON ANSLEY MRN: [id number] Date of Birth: 01-May-1927  Subjective/Objective:   Patient to be discharged per MD order. PT recommended home health PT. Spoke with patient who believes they would benefit from this services. Asked MD to place orders, Gouru states that she will indeed order HHPT. Patient familiar with [facility] and would like to use their agency. Referral placed with [place] who agrees to accept the patient for PT. No DME needs. Family to transport.  Buddy Duty RN BSN RNCM [phone]    Action/Plan:   Expected Discharge Date:  06/15/18               Expected Discharge Plan:  Home w Home Health Services  In-House Referral:     Discharge planning Services  CM Consult  Post Acute Care Choice:  Home Health Choice offered to:  Patient  DME Arranged:    DME Agency:     HH Arranged:  PT HH Agency:  [facility]  Status of Service:  Completed, signed off  If discussed at Long Length of Stay Meetings, dates discussed:    Additional Comments:  Virgel Manifold, RN 06/15/2018, 2:40 PM

## 2018-06-15 NOTE — Discharge Instructions (Signed)
Follow-up with primary care physician in 3 to 5 days.  PCP to check repeat BMP Continue Foley catheter with leg bag and outpatient follow-up with urology at [facility] urology group in 1 to 2 weeks

## 2018-06-15 NOTE — Discharge Summary (Signed)
[place] [facility] [place] NAME: Allen Potter    MR#:  [id number]  DATE OF BIRTH:  03-23-1927  DATE OF ADMISSION:  06/12/2018 ADMITTING PHYSICIAN: Demetrios Loll, MD  DATE OF DISCHARGE:  06/15/18  PRIMARY CARE PHYSICIAN: Tracie Harrier, MD    ADMISSION DIAGNOSIS:  Acute cystitis without hematuria [N30.00] Septic shock ([place]) [A41.9, R65.21]  DISCHARGE DIAGNOSIS:  Active Problems:   Sepsis ([place])   SECONDARY DIAGNOSIS:   Past Medical History:  Diagnosis Date  . COPD (chronic obstructive pulmonary disease) ([place])   . DVT (deep vein thrombosis) in pregnancy ([place])   . Hyperlipidemia   . Restless leg   . Stroke [facility])     HOSPITAL COURSE:    #Sepsis with septic shock secondary to E. coli bacteremia Patient met septic criteria at the time of admission with lactic acidosis and hypotension Septic shock resolved.  Blood pressure improved with IV fluids.  Discontinue finasteride  Lactic acid is improving Culture with gram-negative rods E. coli in 1 set of culture bottles another set of blood with negative growth so far Urine culture with Staphylococcus aureus is probably a contaminant Treated with IV meropenem during the hospital course and discharge patient with p.o. levofloxacin 50 mg every 48 hours for 10 days as recommended by infectious disease appreciate their recommendations Pharmacy consult placed to do renal dosing of levofloxacin, to check the interactions, counsel the patient about levofloxacin  side effects.  Pharmacy will provide [employer] information on levofloxacin  Patient to contact infectious disease doctor Dr. Levester Fresh if you notice any side effects from levofloxacin.  Her beeper number [phone]    #Acute cystitis Urine culture with Staphylococcus-which is most likely contaminant as Staphylococcus is not revealed in the blood cultures   #Acute kidney injury could be from urinary retention/ATN  renal  ultrasound-no acute findings, no hydronephrosis Baseline creatinine at 1.0 ,creatinine 1.58-1.34-1.37, PCP to repeat BMP during the follow-up visit Discussed with on-call urologist Dr. Lovena Neighbours who is recommending outpatient follow-up with [facility] urology after discharge.  Patient will be discharged with Foley catheter and leg bag until he is seen by urology  #Urinary retention  Foley catheter  replaced in the emergency department during this admission Will discharge patient with Foley catheter and follow-up with urology as an outpatient, patient is agreeable Continue Flomax as recommended by urology  #COPD stable no exacerbation continue breathing treatments as needed  #History of DVT continue Eliquis  #Hypokalemia repleted and recheck her 4.6  Generalized weakness according to the patient and wife ,  Patient ambulates with the help of walker, cane and wheelchair at home.  Patient feels comfortable to be discharged with home health physical therapy.  Will arrange home health physical therapy   Patient to contact infectious disease doctor Dr. Levester Fresh if you notice any side effects from levofloxacin.  Her beeper number [phone]   DISCHARGE CONDITIONS:   STABLE  CONSULTS OBTAINED:  Treatment Team:  Ceasar Mons, MD Tsosie Billing, MD   PROCEDURES NONE   DRUG ALLERGIES:  No Known Allergies  DISCHARGE MEDICATIONS:   Allergies as of 06/15/2018   No Known Allergies     Medication List    STOP taking these medications   aspirin 81 MG EC tablet   clopidogrel 75 MG tablet Commonly known as:  PLAVIX   finasteride 5 MG tablet Commonly known as:  PROSCAR     TAKE these medications   acetaminophen 325 MG tablet Commonly known  as:  TYLENOL Take 2 tablets (650 mg total) by mouth every 6 (six) hours as needed for mild pain (or Fever >/= 101).   acidophilus Caps capsule Take 1 capsule by mouth daily. Start taking on:  06/16/2018   apixaban  5 MG Tabs tablet Commonly known as:  ELIQUIS Take 5 mg by mouth 2 (two) times daily.   BREO ELLIPTA 100-25 MCG/INH Aepb Generic drug:  fluticasone furoate-vilanterol Inhale 1 puff into the lungs daily.   ferrous sulfate 325 (65 FE) MG tablet Take 1 tablet by mouth daily.   levofloxacin 750 MG tablet Commonly known as:  LEVAQUIN Take 1 tablet (750 mg total) by mouth every other day. Start taking on:  06/17/2018   senna-docusate 8.6-50 MG tablet Commonly known as:  Senokot-S Take 1 tablet by mouth at bedtime as needed for moderate constipation.   simvastatin 40 MG tablet Commonly known as:  ZOCOR Take 1 tablet (40 mg total) by mouth daily at 6 PM.   SPIRIVA HANDIHALER 18 MCG inhalation capsule Generic drug:  tiotropium Place 1 puff into inhaler and inhale daily.   tamsulosin 0.4 MG Caps capsule Commonly known as:  FLOMAX Take 1 capsule (0.4 mg total) by mouth daily.   VENTOLIN HFA 108 (90 Base) MCG/ACT inhaler Generic drug:  albuterol Inhale 2 puffs into the lungs every 6 (six) hours as needed.        DISCHARGE INSTRUCTIONS:  Follow-up with primary care physician in 3 to 5 days.  PCP to check repeat BMP Continue Foley catheter with leg bag and outpatient follow-up with urology at [facility]rology group in 1 to 2 weeks   DIET:  Cardiac diet  DISCHARGE CONDITION:  Fair  ACTIVITY:  Activity as tolerated  OXYGEN:  Home Oxygen: No.   Oxygen Delivery: room air  DISCHARGE LOCATION:  home   If you experience worsening of your admission symptoms, develop shortness of breath, life threatening emergency, suicidal or homicidal thoughts you must seek medical attention immediately by calling 911 or calling your MD immediately  if symptoms less severe.  You Must read complete instructions/literature along with all the possible adverse reactions/side effects for all the Medicines you take and that have been prescribed to you. Take any new Medicines after you have  completely understood and accpet all the possible adverse reactions/side effects.   Please note  You were cared for by a hospitalist during your hospital stay. If you have any questions about your discharge medications or the care you received while you were in the hospital after you are discharged, you can call the unit and asked to speak with the hospitalist on call if the hospitalist that took care of you is not available. Once you are discharged, your primary care physician will handle any further medical issues. Please note that NO REFILLS for any discharge medications will be authorized once you are discharged, as it is imperative that you return to your primary care physician (or establish a relationship with a primary care physician if you do not have one) for your aftercare needs so that they can reassess your need for medications and monitor your lab values.     Today  Chief Complaint  Patient presents with  . Hypotension    Patient is feeling much better.  Wants to go home.  Wife at bedside.  Discussed about levofloxacin in detail.  Patient and wife verbalized understanding  ROS:  CONSTITUTIONAL: Denies fevers, chills. Denies any fatigue, weakness.  EYES: Denies blurry vision,  double vision, eye pain. EARS, NOSE, THROAT: Denies tinnitus, ear pain, hearing loss. RESPIRATORY: Denies cough, wheeze, shortness of breath.  CARDIOVASCULAR: Denies chest pain, palpitations, edema.  GASTROINTESTINAL: Denies nausea, vomiting, diarrhea, abdominal pain. Denies bright red blood per rectum. GENITOURINARY: Denies dysuria, hematuria. ENDOCRINE: Denies nocturia or thyroid problems. HEMATOLOGIC AND LYMPHATIC: Denies easy bruising or bleeding. SKIN: Denies rash or lesion. MUSCULOSKELETAL: Denies pain in neck, back, shoulder, knees, hips or arthritic symptoms.  NEUROLOGIC: Denies paralysis, paresthesias.  PSYCHIATRIC: Denies anxiety or depressive symptoms.   VITAL SIGNS:  Blood pressure  112/72, pulse 92, temperature (!) 97.5 F (36.4 C), temperature source Oral, resp. rate 20, height _0  (1.753 m), weight 64.4 kg, SpO2 96 %.  I/O:    Intake/Output Summary (Last 24 hours) at 06/15/2018 1329 Last data filed at 06/15/2018 1056 Gross per 24 hour  Intake 1968 ml  Output 4550 ml  Net -2582 ml    PHYSICAL EXAMINATION:  GENERAL:  84 y.o.-year-old patient lying in the bed with no acute distress.  EYES: Pupils equal, round, reactive to light and accommodation. No scleral icterus. Extraocular muscles intact.  HEENT: Head atraumatic, normocephalic. Oropharynx and nasopharynx clear.  NECK:  Supple, no jugular venous distention. No thyroid enlargement, no tenderness.  LUNGS: Normal breath sounds bilaterally, no wheezing, rales,rhonchi or crepitation. No use of accessory muscles of respiration.  CARDIOVASCULAR: S1, S2 normal. No murmurs, rubs, or gallops.  ABDOMEN: Soft, non-tender, non-distended. Bowel sounds present. No organomegaly or mass.  EXTREMITIES: No pedal edema, cyanosis, or clubbing.  NEUROLOGIC: Cranial nerves II through XII are intact. Muscle strength 5/5 in all extremities. Sensation intact. Gait not checked.  PSYCHIATRIC: The patient is alert and oriented x 3.  SKIN: No obvious rash, lesion, or ulcer.   DATA REVIEW:   CBC Recent Labs  Lab 06/13/18 0423  WBC 8.1  HGB 8.5*  HCT 25.3*  PLT 255    Chemistries  Recent Labs  Lab 06/12/18 1130  06/13/18 0423 06/14/18 1655  NA 135  --  135  --   K 3.8  --  3.4* 4.6  CL 102  --  108  --   CO2 26  --  22  --   GLUCOSE 122*  --  75  --   BUN 38*  --  30*  --   CREATININE 1.58*   < > 1.37*  --   CALCIUM 8.1*  --  7.5*  --   AST 32  --   --   --   ALT 21  --   --   --   ALKPHOS 53  --   --   --   BILITOT 0.5  --   --   --    < > = values in this interval not displayed.    Cardiac Enzymes Recent Labs  Lab 06/12/18 1130  TROPONINI 0.03*    Microbiology Results  Results for orders placed or  performed during the hospital encounter of 06/12/18  Blood Culture (routine x 2)     Status: None (Preliminary result)   Collection Time: 06/12/18 11:30 AM  Result Value Ref Range Status   Specimen Description BLOOD LEFT WRIST  Final   Special Requests   Final    BOTTLES DRAWN AEROBIC AND ANAEROBIC Blood Culture adequate volume   Culture   Final    NO GROWTH 3 DAYS Performed at [facility], [address] [place], [place] [address]    Report Status PENDING  Incomplete  Blood Culture (routine x 2)     Status: Abnormal   Collection Time: 06/12/18 11:30 AM  Result Value Ref Range Status   Specimen Description   Final    BLOOD RIGHT AC Performed at [facility], [address] [place], [place] [address]    Special Requests   Final    BOTTLES DRAWN AEROBIC AND ANAEROBIC Blood Culture adequate volume Performed at [facility], [address], [place], [place] [address]    Culture  Setup Time   Final    GRAM NEGATIVE RODS AEROBIC BOTTLE ONLY CRITICAL RESULT CALLED TO, READ BACK BY AND VERIFIED WITH: DAVID BESANTI ON 06/13/18 AT 0045 JAG Performed at [facility], [place] [address] [place], [place] [address]    Culture ESCHERICHIA COLI (A)  Final   Report Status 06/15/2018 FINAL  Final   Organism ID, Bacteria ESCHERICHIA COLI  Final      Susceptibility   Escherichia coli - MIC*    AMPICILLIN <=2 SENSITIVE Sensitive     CEFAZOLIN <=4 SENSITIVE Sensitive     CEFEPIME <=1 SENSITIVE Sensitive     CEFTAZIDIME <=1 SENSITIVE Sensitive     CEFTRIAXONE <=1 SENSITIVE Sensitive     CIPROFLOXACIN <=0.25 SENSITIVE Sensitive     GENTAMICIN <=1 SENSITIVE Sensitive     IMIPENEM <=0.25 SENSITIVE Sensitive     TRIMETH/SULFA <=20 SENSITIVE Sensitive     AMPICILLIN/SULBACTAM <=2 SENSITIVE Sensitive     PIP/TAZO <=4 SENSITIVE Sensitive     Extended ESBL NEGATIVE Sensitive     * ESCHERICHIA COLI  Blood Culture ID Panel (Reflexed)     Status: Abnormal    Collection Time: 06/12/18 11:30 AM  Result Value Ref Range Status   Enterococcus species NOT DETECTED NOT DETECTED Final   Listeria monocytogenes NOT DETECTED NOT DETECTED Final   Staphylococcus species NOT DETECTED NOT DETECTED Final   Staphylococcus aureus NOT DETECTED NOT DETECTED Final   Streptococcus species NOT DETECTED NOT DETECTED Final   Streptococcus agalactiae NOT DETECTED NOT DETECTED Final   Streptococcus pneumoniae NOT DETECTED NOT DETECTED Final   Streptococcus pyogenes NOT DETECTED NOT DETECTED Final   Acinetobacter baumannii NOT DETECTED NOT DETECTED Final   Enterobacteriaceae species DETECTED (A) NOT DETECTED Final    Comment: Enterobacteriaceae represent a large family of gram-negative bacteria, not a single organism. CRITICAL RESULT CALLED TO, READ BACK BY AND VERIFIED WITH: DAVID BESANTI ON 06/13/18 AT 0045 JAG    Enterobacter cloacae complex NOT DETECTED NOT DETECTED Final   Escherichia coli DETECTED (A) NOT DETECTED Final    Comment: CRITICAL RESULT CALLED TO, READ BACK BY AND VERIFIED WITH: DAVID BESANTI ON 06/13/18 AT 0045 BY JAG    Klebsiella oxytoca NOT DETECTED NOT DETECTED Final   Klebsiella pneumoniae NOT DETECTED NOT DETECTED Final   Proteus species NOT DETECTED NOT DETECTED Final   Serratia marcescens NOT DETECTED NOT DETECTED Final   Carbapenem resistance NOT DETECTED NOT DETECTED Final   Haemophilus influenzae NOT DETECTED NOT DETECTED Final   Neisseria meningitidis NOT DETECTED NOT DETECTED Final   Pseudomonas aeruginosa NOT DETECTED NOT DETECTED Final   Candida albicans NOT DETECTED NOT DETECTED Final   Candida glabrata NOT DETECTED NOT DETECTED Final   Candida krusei NOT DETECTED NOT DETECTED Final   Candida parapsilosis NOT DETECTED NOT DETECTED Final   Candida tropicalis NOT DETECTED NOT DETECTED Final    Comment: Performed at [facility], [address], [place], [place] [address]  Urine culture  Status: Abnormal   Collection  Time: 06/12/18 12:26 PM  Result Value Ref Range Status   Specimen Description   Final    URINE, RANDOM Performed at [facility], [address] [place], [place] [address]    Special Requests   Final    NONE Performed at [facility], [address] [place],  [address]    Culture >=100,000 COLONIES/mL STAPHYLOCOCCUS AUREUS (A)  Final   Report Status 06/14/2018 FINAL  Final   Organism ID, Bacteria STAPHYLOCOCCUS AUREUS (A)  Final      Susceptibility   Staphylococcus aureus - MIC*    CIPROFLOXACIN >=8 RESISTANT Resistant     GENTAMICIN <=0.5 SENSITIVE Sensitive     NITROFURANTOIN <=16 SENSITIVE Sensitive     OXACILLIN <=0.25 SENSITIVE Sensitive     TETRACYCLINE <=1 SENSITIVE Sensitive     VANCOMYCIN <=0.5 SENSITIVE Sensitive     TRIMETH/SULFA <=10 SENSITIVE Sensitive     CLINDAMYCIN <=0.25 SENSITIVE Sensitive     RIFAMPIN <=0.5 SENSITIVE Sensitive     Inducible Clindamycin NEGATIVE Sensitive     * >=100,000 COLONIES/mL STAPHYLOCOCCUS AUREUS    RADIOLOGY:  US Renal  Result Date: 06/13/2018 CLINICAL DATA:  Acute kidney injury EXAM: RENAL / URINARY TRACT ULTRASOUND COMPLETE COMPARISON:  None. FINDINGS: Right Kidney: Length: 9.5 cm. 2.3 cm exophytic midpole cyst. Mild cortical thinning and increased echotexture. No hydronephrosis. Left Kidney: Length: 9.2 cm. 2.1 cm exophytic upper pole cyst . Increased echotexture and mild cortical thinning. No hydronephrosis. Bladder: Decompressed with Foley catheter in place. IMPRESSION: No acute findings.  No hydronephrosis. Mildly increased echotexture and cortical thinning compatible with chronic medical renal disease. Electronically Signed   By: Rolm Baptise M.D.   On: 06/13/2018 17:35   Dg Chest Port 1 View  Result Date: 06/12/2018 CLINICAL DATA:  Shortness of breath, hypertension. EXAM: PORTABLE CHEST 1 VIEW COMPARISON:  Radiographs of Mar 28, 2018. FINDINGS: Stable cardiomediastinal silhouette. No pneumothorax is  noted. Right lung is clear. Mild left basilar atelectasis or scarring is noted in left lung base with possible minimal left pleural effusion. Bony thorax is unremarkable. IMPRESSION: Mild left basilar atelectasis or scarring is noted in left lung base with possible minimal left pleural effusion. Electronically Signed   By: Marijo Conception, M.D.   On: 06/12/2018 11:59    EKG:   Orders placed or performed during the hospital encounter of 06/12/18  . EKG 12-Lead  . EKG 12-Lead      Management plans discussed with the patient, family and they are in agreement.  CODE STATUS:     Code Status Orders  (From admission, onward)         Start     Ordered   06/12/18 2002  Full code  Continuous     06/12/18 2001        Code Status History    Date Active Date Inactive Code Status Order ID Comments User Context   02/25/2018 1810 02/28/2018 1745 DNR [id number]  SalaryAvel Peace, MD Inpatient    Advance Directive Documentation     Most Recent Value  Type of Advance Directive  Healthcare Power of Attorney  Pre-existing out of facility DNR order (yellow form or pink MOST form)  -  "MOST" Form in Place?  -      TOTAL TIME TAKING CARE OF THIS PATIENT: 43  minutes.   Note: This dictation was prepared with Dragon dictation along with smaller phrase technology. Any transcriptional errors that result  from this process are unintentional.   _0 @  on 06/15/2018 at 1:29 PM  Between 7am to 6pm - Pager - [phone]  After 6pm go to [email or web address] - password [email or web address] [facility]  Office  [phone]  CC: Primary care physician; Tracie Harrier, MD

## 2018-06-15 NOTE — Consult Note (Signed)
Pharmacy Antibiotic Note  Allen Potter is a 84 y.o. male admitted on 06/12/2018 with bacteremia. Cultures growing pan-sensitive E. Coli. ID has reviewed culture results/sensitivities and is recommending levofloxacin. Pharmacy consulted for dosing.   Plan: Start levofloxacin 750mg  every 48 hours based on patient's current CrCl <63mL/hr. Duration of therapy =10 days.    Height: 5\' 9"  (175.3 cm) Weight: 142 lb (64.4 kg) IBW/kg (Calculated) : 70.7  Temp (24hrs), Avg:97.9 F (36.6 C), Min:97.5 F (36.4 C), Max:98.3 F (36.8 C)  Recent Labs  Lab 06/12/18 1130 06/12/18 1310 06/12/18 2026 06/13/18 0423  WBC 8.9  --   --  8.1  CREATININE 1.58*  --  1.34* 1.37*  LATICACIDVEN 2.0* 1.7  --   --     Estimated Creatinine Clearance: 33.9 mL/min (A) (by C-G formula based on SCr of 1.37 mg/dL (H)).    No Known Allergies  Antimicrobials this admission: 8/14 Cefepime >> x 1 dose  8/15 Meropenem >> 8/16 8/17 Levofloxacin >>   Microbiology results: 8/14 BCx: E. Coli (Pan-sensitive)  8/14 UCx: > 100,000 Staph Aureus   Thank you for allowing pharmacy to be a part of this patient's care.  Gardner Candle, PharmD, BCPS Clinical Pharmacist 06/15/2018 9:54 AM

## 2018-06-15 NOTE — Progress Notes (Signed)
Spoke to patient and wife on phone regarding treating e.coli bacteremia and the options for treatment- levaquin is the only oral antibiotic for gram neg bacteremia- explained side effects including encephalopathy, muscle weakness, tendinitis, tendon rupture, cdiff, etc. They opted to take PO levaquin than IV ceftriaxone.X 10 days Dosing done by pharmacist. Sherron Monday to Dr.Gauru about the plan. Please enter my name and beeper number [phone] in discharge summary for patient to contact if side effect happens

## 2018-06-17 DIAGNOSIS — Z86718 Personal history of other venous thrombosis and embolism: Secondary | ICD-10-CM | POA: Insufficient documentation

## 2018-06-17 DIAGNOSIS — N289 Disorder of kidney and ureter, unspecified: Secondary | ICD-10-CM | POA: Insufficient documentation

## 2018-06-17 LAB — CULTURE, BLOOD (ROUTINE X 2)
Culture: NO GROWTH
Special Requests: ADEQUATE

## 2018-06-18 ENCOUNTER — Telehealth: Payer: Self-pay

## 2018-06-18 NOTE — Telephone Encounter (Signed)
Refill encounter opened in error.

## 2018-06-18 NOTE — Telephone Encounter (Signed)
Flagged on EMMI report for having unfilled prescriptions, questions about discharge papers, and other questions or problems.  Called and spoke with patient's wife, Allen Potter, as he was unavailable.  Wife reports that they do not have any questions about discharge papers or other issues.  Patient saw his PCP yesterday who went over discharge papers with them.  Per labwork, showed he was slightly anemic so was informed to keep taking his iron pill.  They were able to fill all his medications. Home health has been out to see patient and he has several follow up appointments scheduled.  No questions or concerns.  I thanked her for her time and informed her that they would receive one more automated call checking on him in the next few days.

## 2018-06-26 ENCOUNTER — Encounter: Payer: Self-pay | Admitting: *Deleted

## 2018-06-26 ENCOUNTER — Encounter: Payer: Self-pay | Admitting: Urology

## 2018-06-26 ENCOUNTER — Emergency Department: Payer: Medicare Other

## 2018-06-26 ENCOUNTER — Other Ambulatory Visit: Payer: Self-pay

## 2018-06-26 ENCOUNTER — Ambulatory Visit (INDEPENDENT_AMBULATORY_CARE_PROVIDER_SITE_OTHER): Payer: Medicare Other | Admitting: Urology

## 2018-06-26 ENCOUNTER — Emergency Department
Admission: EM | Admit: 2018-06-26 | Discharge: 2018-06-26 | Disposition: A | Discharge status: Home / Self Care | Payer: Medicare Other | Attending: Emergency Medicine | Admitting: Emergency Medicine

## 2018-06-26 VITALS — BP 86/49 | HR 93 | Ht 69.0 in | Wt 141.8 lb

## 2018-06-26 DIAGNOSIS — Z7901 Long term (current) use of anticoagulants: Secondary | ICD-10-CM | POA: Insufficient documentation

## 2018-06-26 DIAGNOSIS — R339 Retention of urine, unspecified: Secondary | ICD-10-CM | POA: Diagnosis not present

## 2018-06-26 DIAGNOSIS — Z8673 Personal history of transient ischemic attack (TIA), and cerebral infarction without residual deficits: Secondary | ICD-10-CM | POA: Insufficient documentation

## 2018-06-26 DIAGNOSIS — N390 Urinary tract infection, site not specified: Secondary | ICD-10-CM | POA: Diagnosis not present

## 2018-06-26 DIAGNOSIS — Z79899 Other long term (current) drug therapy: Secondary | ICD-10-CM | POA: Diagnosis not present

## 2018-06-26 DIAGNOSIS — J449 Chronic obstructive pulmonary disease, unspecified: Secondary | ICD-10-CM | POA: Insufficient documentation

## 2018-06-26 DIAGNOSIS — R319 Hematuria, unspecified: Secondary | ICD-10-CM

## 2018-06-26 DIAGNOSIS — I959 Hypotension, unspecified: Secondary | ICD-10-CM | POA: Diagnosis present

## 2018-06-26 LAB — URINALYSIS, COMPLETE (UACMP) WITH MICROSCOPIC
Bacteria, UA: NONE SEEN
Bilirubin Urine: NEGATIVE
GLUCOSE, UA: NEGATIVE mg/dL
Ketones, ur: NEGATIVE mg/dL
NITRITE: NEGATIVE
PH: 6 (ref 5.0–8.0)
Protein, ur: 30 mg/dL — AB
SPECIFIC GRAVITY, URINE: 1.013 (ref 1.005–1.030)
Squamous Epithelial / LPF: NONE SEEN (ref 0–5)

## 2018-06-26 LAB — BRAIN NATRIURETIC PEPTIDE: B NATRIURETIC PEPTIDE 5: 350 pg/mL — AB (ref 0.0–100.0)

## 2018-06-26 LAB — CBC WITH DIFFERENTIAL/PLATELET
BASOS PCT: 1 %
Basophils Absolute: 0 10*3/uL (ref 0–0.1)
EOS ABS: 0.3 10*3/uL (ref 0–0.7)
Eosinophils Relative: 3 %
HEMATOCRIT: 28.3 % — AB (ref 40.0–52.0)
HEMOGLOBIN: 9.5 g/dL — AB (ref 13.0–18.0)
LYMPHS ABS: 1.2 10*3/uL (ref 1.0–3.6)
Lymphocytes Relative: 14 %
MCH: 28.7 pg (ref 26.0–34.0)
MCHC: 33.4 g/dL (ref 32.0–36.0)
MCV: 85.8 fL (ref 80.0–100.0)
MONOS PCT: 10 %
Monocytes Absolute: 0.8 10*3/uL (ref 0.2–1.0)
NEUTROS PCT: 72 %
Neutro Abs: 5.9 10*3/uL (ref 1.4–6.5)
Platelets: 298 10*3/uL (ref 150–440)
RBC: 3.3 MIL/uL — ABNORMAL LOW (ref 4.40–5.90)
RDW: 16.8 % — AB (ref 11.5–14.5)
WBC: 8.2 10*3/uL (ref 3.8–10.6)

## 2018-06-26 LAB — COMPREHENSIVE METABOLIC PANEL
ALBUMIN: 2.7 g/dL — AB (ref 3.5–5.0)
ALK PHOS: 48 U/L (ref 38–126)
ALT: 13 U/L (ref 0–44)
AST: 21 U/L (ref 15–41)
Anion gap: 8 (ref 5–15)
BILIRUBIN TOTAL: 0.5 mg/dL (ref 0.3–1.2)
BUN: 35 mg/dL — AB (ref 8–23)
CO2: 25 mmol/L (ref 22–32)
CREATININE: 1.49 mg/dL — AB (ref 0.61–1.24)
Calcium: 8.7 mg/dL — ABNORMAL LOW (ref 8.9–10.3)
Chloride: 101 mmol/L (ref 98–111)
GFR calc Af Amer: 46 mL/min — ABNORMAL LOW (ref >60)
GFR, EST NON AFRICAN AMERICAN: 40 mL/min — AB (ref >60)
Glucose, Bld: 92 mg/dL (ref 70–99)
POTASSIUM: 4.8 mmol/L (ref 3.5–5.1)
Sodium: 134 mmol/L — ABNORMAL LOW (ref 135–145)
TOTAL PROTEIN: 6.9 g/dL (ref 6.5–8.1)

## 2018-06-26 LAB — BLADDER SCAN AMB NON-IMAGING

## 2018-06-26 LAB — LACTIC ACID, PLASMA: LACTIC ACID, VENOUS: 1.6 mmol/L (ref 0.5–1.9)

## 2018-06-26 LAB — TROPONIN I: Troponin I: <0.03 ng/mL (ref <0.03)

## 2018-06-26 MED ORDER — LEVOFLOXACIN 500 MG PO TABS: 500.0000 mg | ORAL_TABLET | Freq: Every day | ORAL | 0 refills | Status: AC

## 2018-06-26 MED ORDER — SODIUM CHLORIDE 0.9 % IV SOLN
1.0000 g | Freq: Once | INTRAVENOUS | Status: AC
  Administered 2018-06-26: 1 g via INTRAVENOUS
  Filled 2018-06-26: qty 10

## 2018-06-26 MED ORDER — LEVOFLOXACIN 500 MG PO TABS
500.0000 mg | ORAL_TABLET | Freq: Once | ORAL | Status: AC
  Administered 2018-06-26: 500 mg via ORAL
  Filled 2018-06-26: qty 1

## 2018-06-26 MED ORDER — SODIUM CHLORIDE 0.9 % IV BOLUS
1000.0000 mL | Freq: Once | INTRAVENOUS | Status: AC
  Administered 2018-06-26: 1000 mL via INTRAVENOUS

## 2018-06-26 NOTE — Progress Notes (Signed)
Fill and Pull Catheter Removal  Patient is present today for a catheter removal.  Patient was cleaned and prepped in a sterile fashion of sterile water/ saline was instilled into the bladder when the patient felt the urge to urinate. 18ml of water was then drained from the balloon.  A 16FR foley cath was removed from the bladder no complications were noted .  Patient as then given some time to void on their own.  Patient was unable to void on their own after some time. After speaking with provider, I was given verbal orders to replace catheter.  Preformed by: Debbe Bales, CMA (AAMA)  Follow up/ Additional notes: See Cath note   Cath Change/ Replacement  Patient is present today for a catheter change due to urinary retention.  52ml of water was removed from the balloon, a 16FR foley cath was removed with out difficulty.  Patient was cleaned and prepped in a sterile fashion with betadine and 2% lidocaine jelly was instilled into the urethra. A 16FR foley cath was replaced into the bladder no complications were noted Urine return was noted and urine was clear in color. The balloon was filled with 39ml of sterile water. A leg bag was attached for drainage.  A night bag was also given to the patient and patient was given instruction on how to change from one bag to another. Patient was given proper instruction on catheter care.    Preformed by: Debbe Bales, CMA (AAMA)  Follow up: As scheduled

## 2018-06-26 NOTE — Discharge Instructions (Addendum)
Please take the Levaquin 1 pill a day.  Please return immediately if you feel weak or lightheaded again or if your blood pressure is low.  Also return for vomiting or fever.  Please follow-up with your regular doctor tomorrow

## 2018-06-26 NOTE — ED Notes (Addendum)
Pt to CT

## 2018-06-26 NOTE — Progress Notes (Signed)
06/26/2018 8:39 AM   Allen Potter [birth year] [id number]  Referring provider: Barbette Reichmann, MD [address] [facility] [place], [place] [address]  Chief Complaint  Patient presents with  . Hospitalization Follow-up    HPI: 84 year old male who saw Michiel Cowboy on 06/06/2018 for lower urinary tract symptoms and urinary retention.  A Foley catheter was placed and he was to follow-up in 1 week for a voiding trial.  He was admitted to [facility] on 06/12/2018 with sepsis.  His urine culture grew 100,000 colonies of staph aureus however blood cultures were positive for E. coli.  He is presently on Levaquin.  He remains on tamsulosin.  He presents today for a voiding trial.   PMH: Past Medical History:  Diagnosis Date  . COPD (chronic obstructive pulmonary disease) (HCC)   . DVT (deep vein thrombosis) in pregnancy (HCC)   . Hyperlipidemia   . Restless leg   . Stroke [facility])     Surgical History: Past Surgical History:  Procedure Laterality Date  . CATARACT EXTRACTION      Home Medications:  Allergies as of 06/26/2018   No Known Allergies     Medication List        Accurate as of 06/26/18  8:39 AM. Always use your most recent med list.          acetaminophen 325 MG tablet Commonly known as:  TYLENOL Take 2 tablets (650 mg total) by mouth every 6 (six) hours as needed for mild pain (or Fever >/= 101).   acidophilus Caps capsule Take 1 capsule by mouth daily.   ACIDOPHILUS LACTOBACILLUS PO Take by mouth.   apixaban 5 MG Tabs tablet Commonly known as:  ELIQUIS Take 5 mg by mouth 2 (two) times daily.   BREO ELLIPTA 100-25 MCG/INH Aepb Generic drug:  fluticasone furoate-vilanterol Inhale 1 puff into the lungs daily.   ferrous sulfate 325 (65 FE) MG tablet Take 1 tablet by mouth daily.   levofloxacin 750 MG tablet Commonly known as:  LEVAQUIN Take 1 tablet (750 mg total) by mouth every other day.   senna-docusate 8.6-50 MG tablet Commonly  known as:  Senokot-S Take 1 tablet by mouth at bedtime as needed for moderate constipation.   simvastatin 40 MG tablet Commonly known as:  ZOCOR Take 1 tablet (40 mg total) by mouth daily at 6 PM.   SPIRIVA HANDIHALER 18 MCG inhalation capsule Generic drug:  tiotropium Place 1 puff into inhaler and inhale daily.   tamsulosin 0.4 MG Caps capsule Commonly known as:  FLOMAX Take 1 capsule (0.4 mg total) by mouth daily.   VENTOLIN HFA 108 (90 Base) MCG/ACT inhaler Generic drug:  albuterol Inhale 2 puffs into the lungs every 6 (six) hours as needed.       Allergies: No Known Allergies  Family History: No family history on file.  Social History:  reports that he has never smoked. He has never used smokeless tobacco. He reports that he drank alcohol. His drug history is not on file.  ROS: UROLOGY Frequent Urination?: No Hard to postpone urination?: No Burning/pain with urination?: No Get up at night to urinate?: No Leakage of urine?: No Urine stream starts and stops?: No Trouble starting stream?: No Do you have to strain to urinate?: No Blood in urine?: No Urinary tract infection?: No Sexually transmitted disease?: No Injury to kidneys or bladder?: No Painful intercourse?: No Weak stream?: No Erection problems?: Yes Penile pain?: No  Gastrointestinal Nausea?: No Vomiting?: No  Indigestion/heartburn?: No Diarrhea?: No Constipation?: No  Constitutional Fever: No Night sweats?: No Weight loss?: No Fatigue?: No  Skin Skin rash/lesions?: No Itching?: No  Eyes Blurred vision?: No Double vision?: No  Ears/Nose/Throat Sore throat?: No Sinus problems?: No  Hematologic/Lymphatic Swollen glands?: No Easy bruising?: No  Cardiovascular Leg swelling?: No Chest pain?: No  Respiratory Cough?: No Shortness of breath?: No  Endocrine Excessive thirst?: No  Musculoskeletal Back pain?: No Joint pain?: No  Neurological Headaches?: No Dizziness?:  No  Psychologic Depression?: No Anxiety?: No  Physical Exam: BP (!) 86/49 (BP Location: Left Arm, Patient Position: Sitting, Cuff Size: Normal)   Pulse 93   Ht 5\' 9"  (1.753 m)   Wt 141 lb 12.8 oz (64.3 kg)   BMI 20.94 kg/m   Constitutional:  Alert and oriented, No acute distress. HEENT: [place] AT, moist mucus membranes.  Trachea midline, no masses. Cardiovascular: No clubbing, cyanosis, or edema. Respiratory: Normal respiratory effort, no increased work of breathing. GI: Abdomen is soft, nontender, nondistended, no abdominal masses GU: No CVA tenderness.  Foley catheter draining clear urine Lymph: No cervical or inguinal lymphadenopathy. Skin: No rashes, bruises or suspicious lesions. Neurologic: Grossly intact, no focal deficits, moving all 4 extremities. Psychiatric: Normal mood and affect.   Assessment & Plan:   84 year old male with urinary retention presents for a voiding trial. Catheter was filled with 300 mL of sterile water and he was only able to void a small amount.  His catheter was replaced and he will be scheduled for cystoscopy.    Riki Altes, MD  [facility] [address] [place], [place] [address] [phone]

## 2018-06-26 NOTE — ED Provider Notes (Signed)
[facility] Emergency Department Provider Note   ____________________________________________   First MD Initiated Contact with Patient 06/26/18 1729     (approximate)  I have reviewed the triage vital signs and the nursing notes.   HISTORY  Chief Complaint Hypotension   HPI Allen Potter is a 84 y.o. male who told his doctor he was lightheaded and had a low blood pressure so he was told to come to the emergency room.  Patient reports he is a little bit better now not quite as lightheaded when he sits up.  He denies any fever or chills headache chest pain belly pain pain with urination or any other complaints.  Past Medical History:  Diagnosis Date  . COPD (chronic obstructive pulmonary disease) (HCC)   . DVT (deep vein thrombosis) in pregnancy (HCC)   . Hyperlipidemia   . Restless leg   . Stroke [facility])     Patient Active Problem List   Diagnosis Date Noted  . History of DVT of lower extremity 06/17/2018  . Renal impairment 06/17/2018  . Sepsis (HCC) 06/12/2018  . Acute deep vein thrombosis (DVT) of popliteal vein (HCC) 06/07/2018  . Asymptomatic microscopic hematuria 06/07/2018  . Cardiac syncope 06/07/2018  . Hyperlipidemia, mixed 06/07/2018  . Dysarthria 03/18/2018  . Poor balance 03/18/2018  . Status post CVA 03/18/2018  . BPH associated with nocturia 11/16/2017  . COPD, moderate (HCC) 07/02/2014    Past Surgical History:  Procedure Laterality Date  . CATARACT EXTRACTION      Prior to Admission medications   Medication Sig Start Date End Date Taking? Authorizing Provider  acidophilus (RISAQUAD) CAPS capsule Take 1 capsule by mouth daily. 06/16/18  Yes Gouru, Deanna Artis, MD  apixaban (ELIQUIS) 5 MG TABS tablet Take 5 mg by mouth 2 (two) times daily.  05/17/18  Yes [provider]  ferrous sulfate 325 (65 FE) MG tablet Take 1 tablet by mouth daily. 12/31/17 12/31/18 Yes [provider]  fluticasone furoate-vilanterol (BREO  ELLIPTA) 100-25 MCG/INH AEPB Inhale 1 puff into the lungs daily. 10/02/17  Yes [provider]  levofloxacin (LEVAQUIN) 750 MG tablet Take 1 tablet (750 mg total) by mouth every other day. 06/17/18  Yes Gouru, Deanna Artis, MD  simvastatin (ZOCOR) 40 MG tablet Take 1 tablet (40 mg total) by mouth daily at 6 PM. 02/28/18  Yes Altamese Dilling, MD  tamsulosin (FLOMAX) 0.4 MG CAPS capsule Take 1 capsule (0.4 mg total) by mouth daily. 06/06/18  Yes McGowan, Carollee Herter A, PA-C  tiotropium (SPIRIVA HANDIHALER) 18 MCG inhalation capsule Place 1 puff into inhaler and inhale daily. 10/02/17  Yes [provider]  acetaminophen (TYLENOL) 325 MG tablet Take 2 tablets (650 mg total) by mouth every 6 (six) hours as needed for mild pain (or Fever >/= 101). 06/15/18   Gouru, Deanna Artis, MD  albuterol (VENTOLIN HFA) 108 (90 Base) MCG/ACT inhaler Inhale 2 puffs into the lungs every 6 (six) hours as needed. 10/02/17   [provider]  levofloxacin (LEVAQUIN) 500 MG tablet Take 1 tablet (500 mg total) by mouth daily for 10 days. 06/26/18 07/06/18  Arnaldo Natal, MD  senna-docusate (SENOKOT-S) 8.6-50 MG tablet Take 1 tablet by mouth at bedtime as needed for moderate constipation. 02/28/18   Altamese Dilling, MD    Allergies Patient has no known allergies.  History reviewed. No pertinent family history.  Social History Social History   Tobacco Use  . Smoking status: Never Smoker  . Smokeless tobacco: Never Used  Substance  Use Topics  . Alcohol use: Not Currently    Frequency: Never  . Drug use: Not on file    Review of Systems  Constitutional: No fever/chills Eyes: No visual changes. ENT: No sore throat. Cardiovascular: Denies chest pain. Respiratory: Denies shortness of breath. Gastrointestinal: No abdominal pain.  No nausea, no vomiting.  No diarrhea.  No constipation. Genitourinary: Negative for dysuria. Musculoskeletal: Negative for back pain. Skin: Negative for rash. Neurological:  Negative for headaches, focal weakness   ____________________________________________   PHYSICAL EXAM:  VITAL SIGNS: ED Triage Vitals  Enc Vitals Group     BP 06/26/18 1724 (!) 97/59     Pulse Rate 06/26/18 1724 94     Resp 06/26/18 1724 16     Temp 06/26/18 1724 (!) 97.4 F (36.3 C)     Temp Source 06/26/18 1724 Oral     SpO2 06/26/18 1724 95 %     Weight 06/26/18 1725 141 lb (64 kg)     Height --      Head Circumference --      Peak Flow --      Pain Score 06/26/18 1725 0     Pain Loc --      Pain Edu? --      Excl. in GC? --     Constitutional: Alert and oriented. Well appearing and in no acute distress. Eyes: Conjunctivae are normal. Head: Atraumatic. Nose: No congestion/rhinnorhea. Mouth/Throat: Mucous membranes are moist.  Oropharynx non-erythematous. Neck: No stridor.   Cardiovascular: Normal rate, regular rhythm. Grossly normal heart sounds.  Good peripheral circulation. Respiratory: Normal respiratory effort.  No retractions. Lungs CTAB. Gastrointestinal: Soft and nontender. No distention. No abdominal bruits. No CVA tenderness. Musculoskeletal: No lower extremity tenderness nor edema.  Neurologic:  Normal speech and language. No gross focal neurologic deficits are appreciated.  Skin:  Skin is warm, dry and intact. No rash noted. Psychiatric: Mood and affect are normal. Speech and behavior are normal.  ____________________________________________   LABS (all labs ordered are listed, but only abnormal results are displayed)  Labs Reviewed  URINALYSIS, COMPLETE (UACMP) WITH MICROSCOPIC - Abnormal; Notable for the following components:      Result Value   Color, Urine YELLOW (*)    APPearance CLOUDY (*)    Hgb urine dipstick SMALL (*)    Protein, ur 30 (*)    Leukocytes, UA SMALL (*)    All other components within normal limits  COMPREHENSIVE METABOLIC PANEL - Abnormal; Notable for the following components:   Sodium 134 (*)    BUN 35 (*)    Creatinine,  Ser 1.49 (*)    Calcium 8.7 (*)    Albumin 2.7 (*)    GFR calc non Af Amer 40 (*)    GFR calc Af Amer 46 (*)    All other components within normal limits  BRAIN NATRIURETIC PEPTIDE - Abnormal; Notable for the following components:   B Natriuretic Peptide 350.0 (*)    All other components within normal limits  CBC WITH DIFFERENTIAL/PLATELET - Abnormal; Notable for the following components:   RBC 3.30 (*)    Hemoglobin 9.5 (*)    HCT 28.3 (*)    RDW 16.8 (*)    All other components within normal limits  TROPONIN I  LACTIC ACID, PLASMA  LACTIC ACID, PLASMA  CBG MONITORING, ED   ____________________________________________  EKG  EKG read interpreted by me shows normal sinus rhythm rate of 89 left axis left bundle branch block similar to  23 February 2018 ____________________________________________  RADIOLOGY  ED MD interpretation:  Official radiology report(s): Ct Chest Wo Contrast  Result Date: 06/26/2018 CLINICAL DATA:  Acute respiratory illness. Follow-up lung nodule. History of COPD. EXAM: CT CHEST WITHOUT CONTRAST TECHNIQUE: Multidetector CT imaging of the chest was performed following the standard protocol without IV contrast. COMPARISON:  Chest radiograph June 26, 2018, CT chest report dated May 19, 2004 though images are not available for direct comparison. FINDINGS: CARDIOVASCULAR: The heart is moderately enlarged. No pericardial effusion. Moderate to severe coronary artery calcifications. Thoracic aorta is normal course and caliber, moderate calcific atherosclerosis. MEDIASTINUM/NODES: Limited assessment of lymphadenopathy by noncontrast CT. 12 mm nonspecific subcarinal lymph node. LUNGS/PLEURA: Tracheobronchial tree is patent, no pneumothorax. Severe centrilobular emphysema. Spiculated 2.2 x 1.8 cm LEFT upper lobe nodule corresponding to radiographic abnormality (series 3, image 49). 5 mm LEFT upper lobe subsolid nodule (series 3, image 25), 7 mm sub solid RIGHT middle lobe  nodule (series 3, image 104) RIGHT apical pleuroparenchymal scarring. Scattered calcified granulomas. Bibasilar atelectasis and scarring with bronchiectasis. Patchy LEFT lower lobe subpleural consolidation. RIGHT lower lobe pleural thickening. No pleural effusion. UPPER ABDOMEN: Nonacute. Moderate hiatal hernia. 2.1 cm calcified gallstone without CT findings of acute cholecystitis. Large amount of retained large bowel stool. 15 mm RIGHT adrenal nodule, 29 Hounsfield units. 28 mm cyst exophytic from upper pole LEFT kidney. MUSCULOSKELETAL: Nonacute. 5 mm sclerotic RIGHT fourth and LEFT eighth rib lesions. Nonspecific geographic lucency LEFT posterior fourth rib. Old RIGHT posterior rib fractures. Mild subacute appearing T5 superior endplate compression fracture. IMPRESSION: 1. 2.2 cm suspicious LEFT upper lobe nodule, additional smaller nodules. Recommend CT at 3-6 months. This recommendation follows the consensus statement: Guidelines for Management of Small Pulmonary Nodules Detected on CT Images: From the Fleischner Society 2017; Radiology 2017; 284:228-243. 2. LEFT lower lobe pneumonia. 3. Nonspecific 15 mm RIGHT adrenal, if lung nodule is malignant, this would be concerning for metastatic disease. Small rib bone islands versus metastasis. 4. Large amount of retained large bowel stool, partially imaged. Aortic Atherosclerosis (ICD10-I70.0) and Emphysema (ICD10-J43.9). Electronically Signed   By: Awilda Metro M.D.   On: 06/26/2018 19:54   Dg Chest Portable 1 View  Result Date: 06/26/2018 CLINICAL DATA:  Hypotension, dizziness with standing, history COPD EXAM: PORTABLE CHEST 1 VIEW COMPARISON:  Portable exam 1746 hours compared 06/12/2018 FINDINGS: Normal heart size, mediastinal contours, and pulmonary vascularity. Atherosclerotic calcification aorta. Bibasilar atelectasis. Question nodular density LEFT upper lobe 13 x 10 mm. No pleural effusion or pneumothorax. Bones demineralized. IMPRESSION: Bibasilar  atelectasis. Question 13 x 10 mm LEFT upper lobe pulmonary nodule; CT chest recommended to exclude pulmonary nodule. Electronically Signed   By: Ulyses Southward M.D.   On: 06/26/2018 17:58    ____________________________________________   PROCEDURES  Procedure(s) performed:   Procedures  Critical Care performed:   ____________________________________________   INITIAL IMPRESSION / ASSESSMENT AND PLAN / ED COURSE  Patient's blood pressure is come up with 1 L fluid and stayed up.  I gave him a gram of Rocephin for what appears to be a very mild UTI.  Patient has no pain no fever he is otherwise doing well continuing on making jokes.  He wants to go home his wife wants him to go home I offered him admission but they would rather go home.  As his blood pressures been stable and up for several hours now I will let him go home we will give him 1 dose of Levaquin here tonight and have him  start the rest of the course tomorrow.  We will have him follow-up with his doctor and he promises to return if he gets weak or lightheaded or has any other complaints even at 3:00 in the morning tonight.     Clinical Course as of Jun 27 2211  Wed Jun 26, 2018  2022 CO2: 25 [PM]    Clinical Course User Index [PM] Arnaldo Natal, MD     ____________________________________________   FINAL CLINICAL IMPRESSION(S) / ED DIAGNOSES  Final diagnoses:  Urinary tract infection with hematuria, site unspecified     ED Discharge Orders         Ordered    levofloxacin (LEVAQUIN) 500 MG tablet  Daily     06/26/18 2159           Note:  This document was prepared using Dragon voice recognition software and may include unintentional dictation errors.    Arnaldo Natal, MD 06/26/18 2213

## 2018-06-26 NOTE — ED Triage Notes (Signed)
Pt to ED reporting PT checked his BP today and it was low and upon consulting with PCP pt was told to come to the ED. PT reports he is dizzy when standing but otherwise no neuro deficits noted and pt reports feeling at baseline sitting in wheelchair.

## 2018-06-28 LAB — URINE CULTURE: CULTURE: NO GROWTH

## 2018-07-03 DIAGNOSIS — I428 Other cardiomyopathies: Secondary | ICD-10-CM | POA: Insufficient documentation

## 2018-07-03 DIAGNOSIS — R9431 Abnormal electrocardiogram [ECG] [EKG]: Secondary | ICD-10-CM | POA: Insufficient documentation

## 2018-07-11 ENCOUNTER — Ambulatory Visit (INDEPENDENT_AMBULATORY_CARE_PROVIDER_SITE_OTHER): Payer: Medicare Other | Admitting: Urology

## 2018-07-11 ENCOUNTER — Encounter: Payer: Self-pay | Admitting: Urology

## 2018-07-11 VITALS — BP 95/42 | HR 96 | Ht 69.0 in | Wt 145.0 lb

## 2018-07-11 DIAGNOSIS — R339 Retention of urine, unspecified: Secondary | ICD-10-CM

## 2018-07-11 DIAGNOSIS — N481 Balanitis: Secondary | ICD-10-CM

## 2018-07-11 DIAGNOSIS — I639 Cerebral infarction, unspecified: Secondary | ICD-10-CM

## 2018-07-11 MED ORDER — NYSTATIN 100000 UNIT/GM EX CREA: 1.0000 "application " | TOPICAL_CREAM | Freq: Two times a day (BID) | CUTANEOUS | 0 refills | Status: DC

## 2018-07-11 NOTE — Progress Notes (Signed)
07/11/2018 9:09 AM   Allen Potter 18-Feb-1927 [id number]  Referring provider: Barbette Reichmann, MD [address] [facility] [place], [place] [address]  Chief Complaint  Patient presents with  . Urinary Retention    HPI: Patient is a 84 year old Caucasian male with urinary retention who presents today for follow-up after being seen in the emergency department with his wife, Allen Potter.    Patient was seen on June 26, 2018 in the ED for hypotension.  His UA at that time was positive for 6-10 RBC's and 6-10 WBC's.  He was given an injection of Rocephin and a prescription for Levaquin.  Urine culture was negative.    Today, the Foley catheter still in place.  He is complaining of urgency and incontinence.  Patient denies any gross hematuria, dysuria or suprapubic/flank pain.  Patient denies any fevers, chills, nausea or vomiting.   PMH: Past Medical History:  Diagnosis Date  . COPD (chronic obstructive pulmonary disease) (HCC)   . DVT (deep vein thrombosis) in pregnancy (HCC)   . Hyperlipidemia   . Restless leg   . Stroke [facility])     Surgical History: Past Surgical History:  Procedure Laterality Date  . CATARACT EXTRACTION      Home Medications:  Allergies as of 07/11/2018   No Known Allergies     Medication List        Accurate as of 07/11/18  9:09 AM. Always use your most recent med list.          acetaminophen 325 MG tablet Commonly known as:  TYLENOL Take 2 tablets (650 mg total) by mouth every 6 (six) hours as needed for mild pain (or Fever >/= 101).   acidophilus Caps capsule Take 1 capsule by mouth daily.   apixaban 5 MG Tabs tablet Commonly known as:  ELIQUIS Take 5 mg by mouth 2 (two) times daily.   BREO ELLIPTA 100-25 MCG/INH Aepb Generic drug:  fluticasone furoate-vilanterol Inhale 1 puff into the lungs daily.   ferrous sulfate 325 (65 FE) MG tablet Take 1 tablet by mouth daily.   finasteride 5 MG tablet Commonly known as:   PROSCAR Take by mouth.   senna-docusate 8.6-50 MG tablet Commonly known as:  Senokot-S Take 1 tablet by mouth at bedtime as needed for moderate constipation.   simvastatin 40 MG tablet Commonly known as:  ZOCOR Take 1 tablet (40 mg total) by mouth daily at 6 PM.   SPIRIVA HANDIHALER 18 MCG inhalation capsule Generic drug:  tiotropium Place 1 puff into inhaler and inhale daily.   tamsulosin 0.4 MG Caps capsule Commonly known as:  FLOMAX Take 1 capsule (0.4 mg total) by mouth daily.   VENTOLIN HFA 108 (90 Base) MCG/ACT inhaler Generic drug:  albuterol Inhale 2 puffs into the lungs every 6 (six) hours as needed.   vitamin B-12 1000 MCG tablet Commonly known as:  CYANOCOBALAMIN Take by mouth.       Allergies: No Known Allergies  Family History: History reviewed. No pertinent family history.  Social History:  reports that he has never smoked. He has never used smokeless tobacco. He reports that he drank alcohol. His drug history is not on file.  ROS: UROLOGY Frequent Urination?: No Hard to postpone urination?: Yes Burning/pain with urination?: No Get up at night to urinate?: No Leakage of urine?: Yes Urine stream starts and stops?: No Trouble starting stream?: No Do you have to strain to urinate?: No Blood in urine?: No Urinary tract infection?: No  Sexually transmitted disease?: No Injury to kidneys or bladder?: No Painful intercourse?: No Weak stream?: No Erection problems?: No Penile pain?: No  Gastrointestinal Nausea?: No Vomiting?: No Indigestion/heartburn?: No Diarrhea?: No Constipation?: No  Constitutional Fever: No Night sweats?: No Weight loss?: Yes Fatigue?: No  Skin Skin rash/lesions?: No Itching?: No  Eyes Blurred vision?: No Double vision?: No  Ears/Nose/Throat Sore throat?: No Sinus problems?: No  Hematologic/Lymphatic Swollen glands?: No Easy bruising?: No  Cardiovascular Leg swelling?: No Chest pain?:  No  Respiratory Cough?: No Shortness of breath?: No  Endocrine Excessive thirst?: No  Musculoskeletal Back pain?: No Joint pain?: No  Neurological Headaches?: No Dizziness?: No  Psychologic Depression?: No Anxiety?: No  Physical Exam: BP (!) 95/42 (BP Location: Left Arm, Patient Position: Sitting, Cuff Size: Normal)   Pulse 96   Ht 5\' 9"  (1.753 m)   Wt 145 lb (65.8 kg)   BMI 21.41 kg/m   Constitutional:  Alert and oriented, No acute distress. HEENT: [place] AT, moist mucus membranes.  Trachea midline, no masses. Cardiovascular: No clubbing, cyanosis, or edema. Respiratory: Normal respiratory effort, no increased work of breathing. GI: Abdomen is soft, nontender, nondistended, no abdominal masses GU: No CVA tenderness.  Foley catheter in place.  The leg bag is found around the ankle and catheter is on tension and starting to erode the foreskin.  There is a discharge from the meatus associated with balanitis.   Lymph: No cervical or inguinal lymphadenopathy. Skin: No rashes, bruises or suspicious lesions. Neurologic: Grossly intact, no focal deficits, moving all 4 extremities. Psychiatric: Normal mood and affect.   Assessment & Plan:    1. Urinary retention Foley catheter is exchanged today Patient and his wife are instructed to attach the leg bag to the thigh but the lower leg as to avoid tension on the Foley catheter Explained that the tension on the Foley catheter is starting to erode his penis and that is why we need to leave slack within the Foley catheter He will be scheduled for a cystoscopy to evaluate for BOO/urinary retention I have explained to the patient that they will  be scheduled for a cystoscopy in our office to evaluate their bladder.  The cystoscopy consists of passing a tube with a lens up through their urethra and into their urinary bladder.   We will inject the urethra with a lidocaine gel prior to introducing the cystoscope to help with any discomfort  during the procedure.   After the procedure, they might experience blood in the urine and discomfort with urination.  This will abate after the first few voids.  I have  encouraged the patient to increase water intake  during this time.  Patient denies any allergies to lidocaine.    2. Balanitis Nystatin cream sent to the pharmacy  Patient and his wife instructed to apply twice daily to the head of his penis   Michiel Cowboy, [facility]  [facility] [address] [place], [place] [address] [phone]

## 2018-07-17 DIAGNOSIS — I340 Nonrheumatic mitral (valve) insufficiency: Secondary | ICD-10-CM | POA: Insufficient documentation

## 2018-07-25 ENCOUNTER — Ambulatory Visit (INDEPENDENT_AMBULATORY_CARE_PROVIDER_SITE_OTHER): Payer: Medicare Other | Admitting: Urology

## 2018-07-25 VITALS — BP 113/68 | HR 70 | Ht 72.0 in | Wt 142.6 lb

## 2018-07-25 DIAGNOSIS — R310 Gross hematuria: Secondary | ICD-10-CM

## 2018-07-25 DIAGNOSIS — R339 Retention of urine, unspecified: Secondary | ICD-10-CM | POA: Diagnosis not present

## 2018-07-25 DIAGNOSIS — N138 Other obstructive and reflux uropathy: Secondary | ICD-10-CM

## 2018-07-25 DIAGNOSIS — N401 Enlarged prostate with lower urinary tract symptoms: Secondary | ICD-10-CM

## 2018-07-25 LAB — URINALYSIS, COMPLETE
BILIRUBIN UA: NEGATIVE
GLUCOSE, UA: NEGATIVE
KETONES UA: NEGATIVE
Nitrite, UA: POSITIVE — AB
SPEC GRAV UA: 1.025 (ref 1.005–1.030)
Urobilinogen, Ur: 0.2 mg/dL (ref 0.2–1.0)
pH, UA: 6 (ref 5.0–7.5)

## 2018-07-25 LAB — MICROSCOPIC EXAMINATION
EPITHELIAL CELLS (NON RENAL): NONE SEEN /HPF (ref 0–10)
WBC, UA: >30 /hpf — ABNORMAL HIGH (ref 0–5)

## 2018-07-25 MED ORDER — SULFAMETHOXAZOLE-TRIMETHOPRIM 800-160 MG PO TABS: 1.0000 | ORAL_TABLET | Freq: Two times a day (BID) | ORAL | 0 refills | Status: DC

## 2018-07-25 NOTE — Progress Notes (Signed)
TRUS Procedure Note:  Indication: Foley dependent urinary retention  Briefly, [name] is an 84 year old co-morbid male with history of recent stroke and DVT on anticoagulation with apixaban who has failed numerous voiding trials over the last month.  He initially presented with overflow incontinence, and was found to have a PVR over 1000cc.  He has had numerous hospitalizations for infections/sepsis with foley in place.  Digital rectal exam: Approximately 40 g prostate smooth, no nodules  The transrectal ultrasound probe was inserted into the rectum and the prostate was examined.  There were no hypoechoic lesions noted.  We were able to visualize the Foley balloon within the bladder.  Measurements were taken and the prostate volume was calculated at 34cc.  There was no significant median lobe noted.  Findings: Prostate volume 34cc  Assessment and Plan: Recommend urodynamics prior to undergoing any surgical intervention, as he has numerous co-morbidities including recent stroke/DVT, and is on anticoagulation. I do not think he will be able to perform clean intermittent catheterization secondary to his frailty.  We discussed that if he has an atonic bladder he will require chronic Foley catheter with changes monthly.  If UDS demonstrates viable detrusor function with bladder outlet obstruction, would consider TURP 3 days treatment Bactrim prior to urodynamics to sterilize urine Follow-up to discuss results of UDS  Legrand Rams, MD 07/25/2018

## 2018-08-07 ENCOUNTER — Other Ambulatory Visit: Payer: Self-pay | Admitting: Urology

## 2018-08-14 ENCOUNTER — Other Ambulatory Visit: Payer: Self-pay

## 2018-08-14 ENCOUNTER — Ambulatory Visit (INDEPENDENT_AMBULATORY_CARE_PROVIDER_SITE_OTHER): Payer: Medicare Other | Admitting: Urology

## 2018-08-14 ENCOUNTER — Encounter: Payer: Self-pay | Admitting: Urology

## 2018-08-14 VITALS — BP 92/53 | HR 70 | Ht 72.0 in | Wt 142.0 lb

## 2018-08-14 DIAGNOSIS — N183 Chronic kidney disease, stage 3 unspecified: Secondary | ICD-10-CM | POA: Insufficient documentation

## 2018-08-14 DIAGNOSIS — R339 Retention of urine, unspecified: Secondary | ICD-10-CM

## 2018-08-14 NOTE — Progress Notes (Signed)
   08/14/2018 12:10 PM   Allen Potter September 25, 1927 [id number]  Reason for visit: Discuss urodynamic results  Allen Potter is an 84 year old comorbid male with history of stroke in March 2019, as well as large DVT in June 2019, on anticoagulation with Eliquis, and COPD who I am seeing to discuss his urodynamic results.  He has been in Foley dependent urinary retention since August 2019.  He has failed multiple voiding trials.  We discussed the results of his urodynamics.  This shows high-pressure, low-flow voiding consistent with bladder outlet obstruction.  There is decreased compliance.  We had a long conversation about options for bladder management moving forward.  We can continue to exchange his Foley catheter on a monthly basis or we could pursue a bladder outlet procedure.  His prostate gland measures 34 cc on recent transrectal ultrasound.  We discussed transurethral resection of the prostate (TURP) at length including the risks of bleeding and infection.  He is high risk for surgery with his recent stroke, DVT, and COPD.  He did see his cardiologist today, however the note is not in the chart yet.  I am willing to perform a TURP in the future, however I would like him to be a year out from his stroke so his anticoagulation can be stopped.  He will need PCP and cardiology clearance prior to undergoing any surgery..  -Exchange Foley monthly -Follow-up with me in February 2020 to discuss timing of TURP, will need PCP and cardiology clearance  Greater than 15 minutes were spent with this patient, greater than 50% were spent in direct face-to-face patient education and counseling regarding bladder outlet obstruction and TURP.  Sondra Come, MD  [facility] [address], [address] [place], [place] [address] [phone]

## 2018-08-26 ENCOUNTER — Ambulatory Visit (INDEPENDENT_AMBULATORY_CARE_PROVIDER_SITE_OTHER): Payer: Medicare Other | Admitting: Family Medicine

## 2018-08-26 DIAGNOSIS — R339 Retention of urine, unspecified: Secondary | ICD-10-CM | POA: Diagnosis not present

## 2018-08-26 NOTE — Progress Notes (Signed)
Patient presents today with a leaking leg bag. The bag was removed and a new bag was placed. Patient was taught how to change the bag so he can use the night bag if needed. Patient voiced understanding.

## 2018-09-03 ENCOUNTER — Ambulatory Visit (INDEPENDENT_AMBULATORY_CARE_PROVIDER_SITE_OTHER): Payer: Medicare Other | Admitting: Family Medicine

## 2018-09-03 DIAGNOSIS — R339 Retention of urine, unspecified: Secondary | ICD-10-CM

## 2018-09-03 NOTE — Progress Notes (Signed)
Cath Change/ Replacement  Patient is present today for a catheter change due to urinary retention.  69ml of water was removed from the balloon, a 16FR foley cath was removed with out difficulty.  Patient was cleaned and prepped in a sterile fashion with betadine and 2% lidocaine jelly was instilled into the urethra. A 16 FR foley cath was replaced into the bladder no complications were noted Urine return was noted 7ml and urine was yellow in color. The balloon was filled with 91ml of sterile water. A leg bag was attached for drainage.  A night bag was also given to the patient and patient was given instruction on how to change from one bag to another. Patient was given proper instruction on catheter care.    Preformed by: Teressa Lower, CMA  Follow up: 1 month

## 2018-10-03 ENCOUNTER — Ambulatory Visit (INDEPENDENT_AMBULATORY_CARE_PROVIDER_SITE_OTHER): Payer: Medicare Other | Admitting: Family Medicine

## 2018-10-03 DIAGNOSIS — N481 Balanitis: Secondary | ICD-10-CM

## 2018-10-03 DIAGNOSIS — N4889 Other specified disorders of penis: Secondary | ICD-10-CM

## 2018-10-03 DIAGNOSIS — R339 Retention of urine, unspecified: Secondary | ICD-10-CM | POA: Diagnosis not present

## 2018-10-03 MED ORDER — NYSTATIN 100000 UNIT/GM EX CREA: 1.0000 "application " | TOPICAL_CREAM | Freq: Two times a day (BID) | CUTANEOUS | 0 refills | Status: DC

## 2018-10-03 NOTE — Addendum Note (Signed)
Addended by: Michiel Cowboy A on: 10/03/2018 11:00 AM   Modules accepted: Orders

## 2018-10-03 NOTE — Addendum Note (Signed)
Addended by: Michiel Cowboy A on: 10/03/2018 05:00 PM   Modules accepted: Orders

## 2018-10-03 NOTE — Progress Notes (Addendum)
Cath Change/ Replacement  Patient is present today for a catheter change due to urinary retention.  8ml of water was removed from the balloon, a 16FR foley cath was removed with out difficulty.  Patient was cleaned and prepped in a sterile fashion with betadine and 2% lidocaine jelly was instilled into the urethra. A 16 coude foley cath was replaced into the bladder no complications were noted Urine return was noted 74ml and urine was yellow in color. The balloon was filled with 92ml of sterile water. A leg bag was attached for drainage. Patient was given proper instruction on catheter care.    Preformed by: Teressa Lower, CMA  Follow up: 1 month foley change  I was asked to see the patient as he had significant balanitis and with urethral erosion noted as the catheter was on tension.  I have prescribe Nystatin cream to be applied twice daily and will order home health to ensure the cream is being applied and the catheter remains off tension.

## 2018-11-01 ENCOUNTER — Telehealth: Payer: Self-pay | Admitting: Urology

## 2018-11-01 NOTE — Telephone Encounter (Signed)
Please check to see if patient is receiving Home health regarding his Foley catheter care.

## 2018-11-01 NOTE — Telephone Encounter (Signed)
Spoke with patients wife and she says she has not heard from home health.  Community message sent to [facility] to follow up on referral.

## 2018-11-05 ENCOUNTER — Ambulatory Visit (INDEPENDENT_AMBULATORY_CARE_PROVIDER_SITE_OTHER): Payer: Medicare Other | Admitting: Urology

## 2018-11-05 DIAGNOSIS — R339 Retention of urine, unspecified: Secondary | ICD-10-CM | POA: Diagnosis not present

## 2018-11-05 NOTE — Progress Notes (Signed)
Cath Change/ Replacement  Patient is present today for a catheter change due to urinary retention.  32ml of water was removed from the balloon, a 16FR coude foley cath was removed with out difficulty.  Patient was cleaned and prepped in a sterile fashion with betadine and 2% lidocaine jelly was instilled into the urethra. Patient has an abnormal meatus.  A 16 FR coude foley cath was replaced into the bladder no complications were noted Urine return was noted and urine was yellow in color. The balloon was filled with 7ml of sterile water. A leg bag was attached for drainage.  A night bag was also given to the patient and patient was given instruction on how to change from one bag to another. Patient was given proper instruction on catheter care.    Preformed by: Darrol Angel, CMA(AAMA), [place] Arenas  Follow up: as directed

## 2018-11-11 NOTE — Telephone Encounter (Signed)
Patient is currently having home health visit twice a week and they are changing his bag.

## 2018-11-11 NOTE — Telephone Encounter (Signed)
Any word on Home Health care for Mr. Csizmadia?

## 2018-12-02 ENCOUNTER — Telehealth: Payer: Self-pay | Admitting: Urology

## 2018-12-02 NOTE — Telephone Encounter (Signed)
We will check it out at tomorrows appointment

## 2018-12-02 NOTE — Telephone Encounter (Signed)
Pt's caregiver called and wants to report that the pt is having some discharge, but no signs of UTI. She just wants to let someone know. He is coming in tomorrow for appt.

## 2018-12-03 ENCOUNTER — Encounter: Payer: Self-pay | Admitting: Urology

## 2018-12-03 ENCOUNTER — Telehealth: Payer: Self-pay | Admitting: Radiology

## 2018-12-03 ENCOUNTER — Ambulatory Visit (INDEPENDENT_AMBULATORY_CARE_PROVIDER_SITE_OTHER): Payer: Medicare Other | Admitting: Urology

## 2018-12-03 ENCOUNTER — Other Ambulatory Visit: Payer: Self-pay | Admitting: Radiology

## 2018-12-03 VITALS — BP 112/64 | HR 84 | Ht 72.0 in | Wt 143.4 lb

## 2018-12-03 DIAGNOSIS — R339 Retention of urine, unspecified: Secondary | ICD-10-CM | POA: Diagnosis not present

## 2018-12-03 DIAGNOSIS — N138 Other obstructive and reflux uropathy: Secondary | ICD-10-CM | POA: Diagnosis not present

## 2018-12-03 DIAGNOSIS — N401 Enlarged prostate with lower urinary tract symptoms: Secondary | ICD-10-CM | POA: Diagnosis not present

## 2018-12-03 LAB — URINALYSIS, COMPLETE
Bilirubin, UA: NEGATIVE
Glucose, UA: NEGATIVE
KETONES UA: NEGATIVE
Nitrite, UA: POSITIVE — AB
Specific Gravity, UA: 1.025 (ref 1.005–1.030)
Urobilinogen, Ur: 0.2 mg/dL (ref 0.2–1.0)
pH, UA: 5.5 (ref 5.0–7.5)

## 2018-12-03 LAB — MICROSCOPIC EXAMINATION
Epithelial Cells (non renal): NONE SEEN /hpf (ref 0–10)
WBC, UA: >30 /hpf — ABNORMAL HIGH (ref 0–5)

## 2018-12-03 NOTE — Addendum Note (Signed)
Addended by: Honor Loh on: 12/03/2018 11:13 AM   Modules accepted: Orders

## 2018-12-03 NOTE — Telephone Encounter (Signed)
Patient was given the [facility] Surgery Information form below as well as the Instructions for Pre-Admission Testing form & a map of [facility].   [address], [address] [place], [place] [address] Telephone: [phone] Fax: [phone]   Thank you for choosing [facility] for your upcoming surgery!  We are always here to assist in your urological needs.  Please read the following information with specific details for your upcoming appointments related to your surgery. Please contact T[facility] Drone at [phone] Option 3 with any questions.  The Name of Your Surgery: Transurethral resection of the prostate Your Surgery Date: 12/13/2018 Your Surgeon: Legrand Rams  Please call Same Day Surgery at [phone] between the hours of 1pm-3pm one day prior to your surgery. They will inform you of the time to arrive at Same Day Surgery which is located on the second floor of the [facility].   Please refer to the attached letter regarding instructions for Pre-Admission Testing. You will receive a call from the Pre-Admission Testing office regarding your appointment with them.  The Pre-Admission Testing office is located at [address] [facility] [facility], on the first floor of the [facility] in [address] (office is to the right as you enter through the [employer] of the [employer]). Please have all medications you are currently taking and your insurance card available.   Patient was advised to have nothing to eat or drink after midnight the night prior to surgery except that he may have only water until 2 hours before surgery with nothing to drink within 2 hours of surgery.  The patient states he currently takes no blood thinners. Instructed patient to contact Dr Gwen Pounds regarding this because in his last office note, Dr Gwen Pounds states he should be taking Eliquis.  Instructed patient that medication can be started if needed but will need to be held for 3 days prior to surgery. Patient's & wife's questions were answered and they expressed understanding of these instructions.

## 2018-12-03 NOTE — Progress Notes (Signed)
   12/03/2018 9:15 AM   Rolan Bucco 07-25-1927 [id number]  Reason for visit: Follow up urinary retention  I saw Mr. Austerman back in urology clinic to discuss TURP.  He is an 85 year old male with history of stroke in March 2019 as well as a large DVT in June 2019 who was previously anticoagulated with aspirin and Eliquis.  He also has COPD.  He has been in Foley dependent urinary retention since August 2019, and failed multiple voiding trials despite maximal medical therapy with Flomax and finasteride.  His Eliquis was recently discontinued.  His prostate measured 34 cc on transrectal ultrasound.  He has had urodynamics that showed high pressure low flow voiding consistent with bladder outlet obstruction with decreased compliance.  On exam he has significant urethral erosion, there is no abscess or induration.  I discussed at length the importance of keeping the catheter off tension and changing legs to prevent further erosion.  We discussed TURP at length again today.  Risks include bleeding, infection, and temporary urinary incontinence.  We also discussed the risks of anesthesia and surgery that include possible significant pulmonary or cardiac complications including PE, heart attack, stroke, and death.  We discussed he has higher risk of complications with his history of DVT and stroke, however the benefits of being catheter free outweigh the risks of proceeding with TURP.  -Schedule TURP, will admit overnight for observation, and follow-up for voiding trial in 2 to 3 days postop -Catheter change today, send urine for culture -Needs cardiology clearance, okay to continue 81 mg aspirin peri-operatively  A total of 15 minutes were spent face-to-face with the patient, greater than 50% was spent in patient education, counseling, and coordination of care regarding bladder outlet obstruction, TURP, and risks and benefits of proceeding with surgery.   Sondra Come, MD  [facility]  [facility] [address] [place], [place] [address] [phone]

## 2018-12-08 LAB — CULTURE, URINE COMPREHENSIVE

## 2018-12-09 ENCOUNTER — Telehealth: Payer: Self-pay

## 2018-12-09 MED ORDER — NITROFURANTOIN MONOHYD MACRO 100 MG PO CAPS: 100.0000 mg | ORAL_CAPSULE | Freq: Two times a day (BID) | ORAL | 0 refills | Status: DC

## 2018-12-09 NOTE — Telephone Encounter (Signed)
-----   Message from Sondra Come, MD sent at 12/09/2018 11:15 AM EST ----- Please start him on Nitrofurantoin 100mg  BID x 5 days to start ASAP in anticipation of upcoming surgery  Thanks Legrand Rams, MD 12/09/2018

## 2018-12-09 NOTE — Telephone Encounter (Signed)
Called and advised patients wife, [name], per DPR, of result and that patient needs to begin taking Nitrofurantoin BID x 5 days. Wife verbalized understanding. Rx sent to [facility].

## 2018-12-10 ENCOUNTER — Inpatient Hospital Stay: Admission: RE | Admit: 2018-12-10 | Payer: Medicare Other | Source: Ambulatory Visit

## 2018-12-12 ENCOUNTER — Encounter
Admission: RE | Admit: 2018-12-12 | Discharge: 2018-12-12 | Disposition: A | Discharge status: Home / Self Care | Payer: Medicare Other | Source: Ambulatory Visit | Attending: Urology | Admitting: Urology

## 2018-12-12 ENCOUNTER — Other Ambulatory Visit: Payer: Self-pay

## 2018-12-12 DIAGNOSIS — Z87891 Personal history of nicotine dependence: Secondary | ICD-10-CM | POA: Diagnosis not present

## 2018-12-12 DIAGNOSIS — Z01818 Encounter for other preprocedural examination: Secondary | ICD-10-CM | POA: Insufficient documentation

## 2018-12-12 DIAGNOSIS — I517 Cardiomegaly: Secondary | ICD-10-CM | POA: Insufficient documentation

## 2018-12-12 DIAGNOSIS — I429 Cardiomyopathy, unspecified: Secondary | ICD-10-CM

## 2018-12-12 DIAGNOSIS — N411 Chronic prostatitis: Secondary | ICD-10-CM | POA: Diagnosis not present

## 2018-12-12 DIAGNOSIS — R9431 Abnormal electrocardiogram [ECG] [EKG]: Secondary | ICD-10-CM | POA: Insufficient documentation

## 2018-12-12 DIAGNOSIS — N138 Other obstructive and reflux uropathy: Secondary | ICD-10-CM | POA: Diagnosis not present

## 2018-12-12 DIAGNOSIS — N3020 Other chronic cystitis without hematuria: Secondary | ICD-10-CM | POA: Diagnosis not present

## 2018-12-12 DIAGNOSIS — N368 Other specified disorders of urethra: Secondary | ICD-10-CM | POA: Diagnosis not present

## 2018-12-12 DIAGNOSIS — Z8673 Personal history of transient ischemic attack (TIA), and cerebral infarction without residual deficits: Secondary | ICD-10-CM | POA: Diagnosis not present

## 2018-12-12 DIAGNOSIS — N401 Enlarged prostate with lower urinary tract symptoms: Secondary | ICD-10-CM | POA: Diagnosis present

## 2018-12-12 DIAGNOSIS — R338 Other retention of urine: Secondary | ICD-10-CM | POA: Diagnosis not present

## 2018-12-12 DIAGNOSIS — Z86718 Personal history of other venous thrombosis and embolism: Secondary | ICD-10-CM | POA: Diagnosis not present

## 2018-12-12 DIAGNOSIS — J449 Chronic obstructive pulmonary disease, unspecified: Secondary | ICD-10-CM | POA: Diagnosis not present

## 2018-12-12 HISTORY — DX: Anemia, unspecified: D64.9

## 2018-12-12 HISTORY — DX: Acute embolism and thrombosis of unspecified deep veins of unspecified lower extremity: I82.409

## 2018-12-12 LAB — CBC
HEMATOCRIT: 33.6 % — AB (ref 39.0–52.0)
Hemoglobin: 9.9 g/dL — ABNORMAL LOW (ref 13.0–17.0)
MCH: 26 pg (ref 26.0–34.0)
MCHC: 29.5 g/dL — ABNORMAL LOW (ref 30.0–36.0)
MCV: 88.2 fL (ref 80.0–100.0)
Platelets: 275 10*3/uL (ref 150–400)
RBC: 3.81 MIL/uL — ABNORMAL LOW (ref 4.22–5.81)
RDW: 15 % (ref 11.5–15.5)
WBC: 10 10*3/uL (ref 4.0–10.5)
nRBC: 0 % (ref 0.0–0.2)

## 2018-12-12 LAB — BASIC METABOLIC PANEL
Anion gap: 4 — ABNORMAL LOW (ref 5–15)
BUN: 27 mg/dL — ABNORMAL HIGH (ref 8–23)
CO2: 28 mmol/L (ref 22–32)
Calcium: 8.6 mg/dL — ABNORMAL LOW (ref 8.9–10.3)
Chloride: 103 mmol/L (ref 98–111)
Creatinine, Ser: 1.15 mg/dL (ref 0.61–1.24)
GFR calc Af Amer: >60 mL/min (ref >60)
GFR calc non Af Amer: 56 mL/min — ABNORMAL LOW (ref >60)
Glucose, Bld: 89 mg/dL (ref 70–99)
Potassium: 3.9 mmol/L (ref 3.5–5.1)
Sodium: 135 mmol/L (ref 135–145)

## 2018-12-12 NOTE — Pre-Procedure Instructions (Signed)
Echo complete9/18/2019 [facility] Component Name Value Ref Range  LV Ejection Fraction (%) 20   Aortic Valve Regurgitation Grade trivial   Aortic Valve Stenosis Grade none   Aortic Valve Max Velocity (m/s) 1.2 m/sec  Aortic Valve Stenosis Mean Gradient (mmHg) 3.0 mmHg  Mitral Valve Regurgitation Grade moderate   Mitral Valve Stenosis Grade none   Tricuspid Valve Regurgitation Grade mild   Tricuspid Valve Regurgitation Max Velocity (m/s) 3.4 m/sec  Right Ventricle Systolic Pressure (mmHg) 52.4 mmHg  LV End Diastolic Diameter (cm) 6.3 cm  LV End Systolic Diameter (cm) 5.5 cm  LV Septum Wall Thickness (cm) 0.95 cm  LV Posterior Wall Thickness (cm) 0.98 cm  Left Atrium Diameter (cm) 3.8 cm  Result Narrative  CARDIOLOGY DATRON, LOPIANO [id number] [facility] Acct #: [id number] [address] [place], [place] [address] Date: 07/17/2018 08: 54 AM  Adult Male Age: 85 yrs ECHOCARDIOGRAM REPORTOutpatient  KC^^KCWC  STUDY:CHEST WALL TAPE:0000: 00: 0: 00: 00 MD1: KOWALSKI, BRUCE JAY ECHO:YesDOPPLER:Yes FILE:0000-000-000BP: 90/60 mmHg  COLOR:Yes CONTRAST:No MACHINE:Philips  RV BIOPSY:No3D:NoSOUND QLTY:ModerateHeight: 67 in MEDIUM:NoneWeight: 144 lb  BSA: 1.8 m2 _________________________________________________________________________________________ HISTORY: Cardiomyopathy  REASON: Assess, LV function  INDICATION: I42.8  Other cardiomyopathies, R94.31 Abnormal electrocardiogram  [ECG][EKG] _________________________________________________________________________________________ ECHOCARDIOGRAPHIC MEASUREMENTS 2D DIMENSIONS AORTAValues Normal Range MAIN PA ValuesNormal Range Annulus: nm*[2.3-2.9] PA Main: nm* [1.5-2.1] Aorta Sin: 3.5 cm [3.1-3.7]RIGHT VENTRICLE ST Junction: nm*[2.6-3.2] RV Base: 5.1 cm[< 4.2] Asc.Aorta: nm*[2.6-3.4]RV Mid: nm* [<3.5] LEFT VENTRICLERV Length: nm* [<8.6] LVIDd: 6.3 cm [4.2-5.9]INFERIOR VENA CAVA LVIDs: 5.5 cmMax. IVC: nm* [<=2.1]  FS: 11.9 % [>25]Min. IVC: nm* SWT: 0.95 cm[0.6-1.0]------------------ PWT: 0.98 cm[0.6-1.0]nm* - not measured LEFT ATRIUM LA Diam: 3.8 cm [3.0-4.0] LA A4C Area: nm*[<20] LA Volume: nm*[18-58] _________________________________________________________________________________________ ECHOCARDIOGRAPHIC DESCRIPTIONS AORTIC ROOT  Size: Normal  Dissection: INDETERM FOR DISSECTION AORTIC VALVE  Leaflets: Tricuspid Morphology: Normal  Mobility: Fully mobile LEFT VENTRICLE  Size: MODERATELY ENLARGED Anterior: HYPOCONTRACTILE Contraction: SEVERE GLOBAL DECREASE Lateral: HYPOCONTRACTILE  Closest EF: 20% (Estimated) Septal: HYPOCONTRACTILE LV Masses: No Masses Apical: HYPOCONTRACTILE LVH:  NoneInferior: HYPOCONTRACTILE Posterior: HYPOCONTRACTILE  Dias.FxClass: (Grade 2) relaxation abnormal, pseudonormal MITRAL VALVE  Leaflets: NormalMobility: Fully mobile  Morphology: Normal LEFT ATRIUM  Size: MILDLY ENLARGEDLA Masses: No masses IA Septum: Normal IAS MAIN PA  Size: Normal PULMONIC VALVE  Morphology: NormalMobility: Fully mobile RIGHT VENTRICLE  Size: MILDLY ENLARGEDFree Wall: Normal Contraction: Normal RV Masses: No Masses TRICUSPID VALVE  Leaflets: NormalMobility: Fully mobile  Morphology: Normal RIGHT ATRIUM  Size: MILDLY ENLARGED RA Other: None RA Mass: No masses PERICARDIUM Fluid: No effusion INFERIOR VENACAVA  Size: Normal Normal respiratory collapse _________________________________________________________________________________________  DOPPLER ECHO and OTHER SPECIAL PROCEDURES  Aortic: TRIVIAL AR No AS  120.6 cm/sec peak vel5.8 mmHg peak grad  3.0 mmHg mean grad 2.9 cm^2 by DOPPLER  Mitral: MODERATE MRNo MS  MV Inflow E Vel = 121.0 cm/secMV Annulus E'Vel = 5.3 cm/sec  E/E'Ratio = 22.8 Tricuspid: MILD TRNo TS  344.2 cm/sec peak TR vel 52.4 mmHg peak RV pressure Pulmonary: TRIVIAL PR No PS _________________________________________________________________________________________ INTERPRETATION SEVERE LV  SYSTOLIC DYSFUNCTION WITH AN ESTIMATED EF = 20 % NORMAL RIGHT VENTRICULAR SYSTOLIC FUNCTION MODERATE MITRAL VALVE INSUFFICIENCY MILD-TO-MODERATE TRICUSPID VALVE INSUFFICIENCY TRACE AORTIC VALVE INSUFFICIENCY NO VALVULAR STENOSIS MODERATE LV ENLARGEMENT MILD RV ENLARGEMENT MILD BIATRIAL ENLARGEMENT _________________________________________________________________________________________ Electronically signed byMD Arnoldo Hooker on 07/17/2018 09: 50 AM  Performed By: Luretha Murphy, RDCS, RVT  Ordering Physician: Arnoldo Hooker _________________________________________________________________________________________  Other Result Information  Interface, Text Results In - 07/17/2018  9:51 AM EDT                         CARDIOLOGY DEPARTMENT                FATHI, DESCHEPPER           [facility]                                    [phone]           [facility]                           Acct #: [id number]           [address] [place], [place] [address]       Date: 07/17/2018 08: 54 AM                                                              Adult   Male   Age: 68 yrs           ECHOCARDIOGRAM REPORT                              Outpatient                                                              KC^^KCWC      STUDY:CHEST WALL               TAPE:0000: 00: 0: 00: 00 MD1: KOWALSKI, BRUCE JAY       ECHO:Yes    DOPPLER:Yes       FILE:0000-000-000        BP: 90/60 mmHg      COLOR:Yes   CONTRAST:No     MACHINE:Philips  RV BIOPSY:No          3D:No  SOUND QLTY:Moderate            Height: 67 in     MEDIUM:None                                              Weight: 144 lb                                                              BSA: 1.8 m2 _________________________________________________________________________________________               HISTORY: Cardiomyopathy                REASON: Assess, LV function  INDICATION: I42.8 Other  cardiomyopathies, R94.31 Abnormal electrocardiogram                        [ECG]    [EKG] _________________________________________________________________________________________ ECHOCARDIOGRAPHIC MEASUREMENTS 2D DIMENSIONS AORTA                  Values   Normal Range   MAIN PA         Values    Normal Range               Annulus: nm*          [2.3-2.9]         PA Main: nm*       [1.5-2.1]             Aorta Sin: 3.5 cm       [3.1-3.7]    RIGHT VENTRICLE           ST Junction: nm*          [2.6-3.2]         RV Base: 5.1 cm    [< 4.2]             Asc.Aorta: nm*          [2.6-3.4]          RV Mid: nm*       [<3.5] LEFT VENTRICLE                                      RV Length: nm*       [<8.6]                 LVIDd: 6.3 cm       [4.2-5.9]    INFERIOR VENA CAVA                 LVIDs: 5.5 cm                        Max. IVC: nm*       [<=2.1]                    FS: 11.9 %       [>25]            Min. IVC: nm*                   SWT: 0.95 cm      [0.6-1.0]    ------------------                   PWT: 0.98 cm      [0.6-1.0]    nm* - not measured LEFT ATRIUM               LA Diam: 3.8 cm       [3.0-4.0]           LA A4C Area: nm*          [<20]             LA Volume: nm*          [18-58] _________________________________________________________________________________________ ECHOCARDIOGRAPHIC DESCRIPTIONS AORTIC ROOT                  Size: Normal            Dissection: INDETERM FOR DISSECTION AORTIC VALVE  Leaflets: Tricuspid                   Morphology: Normal              Mobility: Fully mobile LEFT VENTRICLE                  Size: MODERATELY ENLARGED           Anterior: HYPOCONTRACTILE           Contraction: SEVERE GLOBAL DECREASE         Lateral: HYPOCONTRACTILE            Closest EF: 20% (Estimated)                 Septal: HYPOCONTRACTILE             LV Masses: No Masses                       Apical: HYPOCONTRACTILE                   LVH: None                           Inferior: HYPOCONTRACTILE                                                     Posterior: HYPOCONTRACTILE          Dias.FxClass: (Grade 2) relaxation abnormal, pseudonormal MITRAL VALVE              Leaflets: Normal                        Mobility: Fully mobile            Morphology: Normal LEFT ATRIUM                  Size: MILDLY ENLARGED              LA Masses: No masses             IA Septum: Normal IAS MAIN PA                  Size: Normal PULMONIC VALVE            Morphology: Normal                        Mobility: Fully mobile RIGHT VENTRICLE                  Size: MILDLY ENLARGED              Free Wall: Normal           Contraction: Normal                       RV Masses: No Masses TRICUSPID VALVE              Leaflets: Normal                        Mobility: Fully mobile            Morphology: Normal RIGHT ATRIUM  Size: MILDLY ENLARGED               RA Other: None               RA Mass: No masses PERICARDIUM                 Fluid: No effusion INFERIOR VENACAVA                  Size: Normal Normal respiratory collapse _________________________________________________________________________________________  DOPPLER ECHO and OTHER SPECIAL PROCEDURES                Aortic: TRIVIAL AR                 No AS                        120.6 cm/sec peak vel      5.8 mmHg peak grad                        3.0 mmHg mean grad         2.9 cm^2 by DOPPLER                Mitral: MODERATE MR                No MS                        MV Inflow E Vel = 121.0 cm/sec      MV Annulus E'Vel = 5.3 cm/sec                        E/E'Ratio = 22.8             Tricuspid: MILD TR                    No TS                        344.2 cm/sec peak TR vel   52.4 mmHg peak RV pressure             Pulmonary: TRIVIAL PR                 No PS _________________________________________________________________________________________ INTERPRETATION SEVERE LV SYSTOLIC DYSFUNCTION WITH AN  ESTIMATED EF = 20 % NORMAL RIGHT VENTRICULAR SYSTOLIC FUNCTION MODERATE MITRAL VALVE INSUFFICIENCY MILD-TO-MODERATE TRICUSPID VALVE INSUFFICIENCY TRACE AORTIC VALVE INSUFFICIENCY NO VALVULAR STENOSIS MODERATE LV ENLARGEMENT MILD RV ENLARGEMENT MILD BIATRIAL ENLARGEMENT _________________________________________________________________________________________ Electronically signed by      MD Arnoldo HookerBruce Kowalski on 07/17/2018 09: 50 AM          Performed By: Luretha MurphyMartin, Matthew, RDCS, RVT    Ordering Physician: Arnoldo HookerKOWALSKI, BRUCE _________________________________________________________________________________________

## 2018-12-12 NOTE — Patient Instructions (Signed)
INSTRUCTIONS FOR SURGERY     Your surgery is scheduled for:  Friday, February 14th   PLEASE ARRIVE IN SAME DAY SURGERY AT 8:00 AM     When you arrive for surgery, report to the SECOND FLOOR OF THE MEDICAL MALL.       Do NOT stop on the first floor to register.    REMEMBER: Instructions that are not followed completely may result in serious medical risk,  up to and including death, or upon the discretion of your surgeon and anesthesiologist,            your surgery may need to be rescheduled.  __X__ 1. Do not eat food after midnight the night before your procedure.                    No gum, candy, lozenger, tic tacs, tums or hard candies.                  ABSOLUTELY NOTHING SOLID IN YOUR MOUTH AFTER MIDNIGHT                    You may drink unlimited clear liquids up to 2 hours before you are scheduled                       to arrive for surgery. Do not drink anything within those 2 hours unless                      You need to take medicine, then take the smallest amount you need.                  Clear liquids include:  water, apple juice without pulp, any flavor Gatorade,                        Black coffee, black tea.  Sugar may be added but no dairy/ honey /lemon.                        Broth and jello is not considered a clear liquid.  __x__  2. On the morning of surgery, please brush your teeth with toothpaste and water.                    You may rinse with mouthwash if you wish but DO NOT SWALLOW TOOTHPASTE OR MOUTHWASH  __X___3. NO alcohol for 24 hours before or after surgery.  __x___ 4.  Do NOT smoke or use e-cigarettes for 24 HOURS PRIOR TO SURGERY.                      DO NOT Use any chewable tobacco products for at least 6 hours prior to surgery.  __x___ 5. If you start any new medication after this appointment and prior to surgery, please                   Bring it with you on the day of surgery.  ___x__ 6.  Notify your doctor if there is any change  in your medical condition, such as fever, infection, vomitting, diarrhea.  __x___ 7.  USE the CHG SOAP as instructed, the night before surgery and the day of surgery.                    Once you have washed with this soap, do NOT use any of the following:                      Powders, perfumes, lotions, make up, hairpins, clips or nail polish.                     You __MAY_____   wear deodorant.                      Men may shave their face and neck.  Women need to shave 48 hours prior to surgery.                      DO NOT wear ANY jewelry on the day of surgery. If there are rings that are too tight to remove easily,                         please address this prior to the surgery day. Piercings need to be removed.   NO METAL ON YOUR BODY.                     Do NOT bring any valuables.  If you came to Pre-Admit testing then you will not need license, insurance card or credit card.                       If you will be staying overnight, please either leave your things in the car or have your family be responsible for this.                      [facility] IS NOT RESPONSIBLE FOR BELONGINGS OR VALUABLES.  ___X__ 8. DO NOT wear contact lenses on surgery day.  You may not have dentures,                     Hearing aides, contacts or glasses in the operating room. These items can be                    Placed in the Recovery Room to receive immediately after surgery.  __x___ 9. IF YOU ARE SCHEDULED TO GO HOME ON THE SAME DAY, YOU MUST                   Have someone to drive you home and to stay with you  for the first 24 hours.                    Have an arrangement prior to arriving on surgery day.  _____ 10. Take the following medications on the morning of surgery with a sip of water:                              1. SIMVASTATIN                     2. NITROFUANTOIN  3. FINASTERIDE                     4. CARVEDILOL                      5. BREO INHALER                     6.  _____ 11.  Follow any instructions provided to you by your surgeon.                        Such as enema, clear liquid bowel prep  __X__  12. STOP ASPIRIN AS OF: NOW                       THIS INCLUDES BC POWDERS / GOODIES POWDER  __x___ 13. STOP Anti-inflammatories as of:                       This includes IBUPROFEN / MOTRIN / ADVIL / ALEVE/ NAPROXYN                    YOU MAY TAKE TYLENOL ANY TIME PRIOR TO SURGERY.  __x___ 14.  Stop supplements until after surgery.                     This includes: IRON    You may continue taking Vitamin B12 / Vitamin D3 but do not take on the morning of surgery.  _____ 15. Bring your CPAP machine into preop with you on the morning of surgery.  Bring sturdy shoes or slippers for overnight stay.  Clean surgical site well. Do not apply usual cream.

## 2018-12-13 ENCOUNTER — Encounter: Payer: Self-pay | Admitting: Emergency Medicine

## 2018-12-13 ENCOUNTER — Encounter
Admission: RE | Disposition: A | Discharge status: Home / Self Care | Payer: Self-pay | Source: Home / Self Care | Attending: Urology

## 2018-12-13 ENCOUNTER — Ambulatory Visit: Payer: Medicare Other

## 2018-12-13 ENCOUNTER — Observation Stay
Admission: RE | Admit: 2018-12-13 | Discharge: 2018-12-14 | Disposition: A | Discharge status: Home / Self Care | Payer: Medicare Other | Attending: Urology | Admitting: Urology

## 2018-12-13 ENCOUNTER — Other Ambulatory Visit: Payer: Self-pay

## 2018-12-13 DIAGNOSIS — R339 Retention of urine, unspecified: Secondary | ICD-10-CM

## 2018-12-13 DIAGNOSIS — R338 Other retention of urine: Secondary | ICD-10-CM | POA: Insufficient documentation

## 2018-12-13 DIAGNOSIS — N3289 Other specified disorders of bladder: Secondary | ICD-10-CM

## 2018-12-13 DIAGNOSIS — N368 Other specified disorders of urethra: Secondary | ICD-10-CM | POA: Insufficient documentation

## 2018-12-13 DIAGNOSIS — N411 Chronic prostatitis: Secondary | ICD-10-CM | POA: Insufficient documentation

## 2018-12-13 DIAGNOSIS — N3020 Other chronic cystitis without hematuria: Secondary | ICD-10-CM | POA: Insufficient documentation

## 2018-12-13 DIAGNOSIS — Z8673 Personal history of transient ischemic attack (TIA), and cerebral infarction without residual deficits: Secondary | ICD-10-CM | POA: Insufficient documentation

## 2018-12-13 DIAGNOSIS — N320 Bladder-neck obstruction: Secondary | ICD-10-CM

## 2018-12-13 DIAGNOSIS — N138 Other obstructive and reflux uropathy: Secondary | ICD-10-CM | POA: Diagnosis present

## 2018-12-13 DIAGNOSIS — N401 Enlarged prostate with lower urinary tract symptoms: Principal | ICD-10-CM | POA: Insufficient documentation

## 2018-12-13 DIAGNOSIS — Z86718 Personal history of other venous thrombosis and embolism: Secondary | ICD-10-CM | POA: Insufficient documentation

## 2018-12-13 DIAGNOSIS — Z87891 Personal history of nicotine dependence: Secondary | ICD-10-CM | POA: Insufficient documentation

## 2018-12-13 DIAGNOSIS — J449 Chronic obstructive pulmonary disease, unspecified: Secondary | ICD-10-CM | POA: Insufficient documentation

## 2018-12-13 HISTORY — PX: CYSTOSCOPY WITH BIOPSY: SHX5122

## 2018-12-13 HISTORY — PX: TRANSURETHRAL RESECTION OF PROSTATE: SHX73

## 2018-12-13 SURGERY — TURP (TRANSURETHRAL RESECTION OF PROSTATE)
Anesthesia: General | Site: Prostate

## 2018-12-13 MED ORDER — NITROFURANTOIN MACROCRYSTAL 100 MG PO CAPS: 100.0000 mg | ORAL_CAPSULE | Freq: Two times a day (BID) | ORAL | 0 refills | Status: AC

## 2018-12-13 MED ORDER — CIPROFLOXACIN IN D5W 400 MG/200ML IV SOLN
400.0000 mg | Freq: Once | INTRAVENOUS | Status: AC
  Administered 2018-12-13: 400 mg via INTRAVENOUS

## 2018-12-13 MED ORDER — HYDROCODONE-ACETAMINOPHEN 5-325 MG PO TABS: 1.0000 | ORAL_TABLET | ORAL | 0 refills | Status: AC | PRN

## 2018-12-13 MED ORDER — SODIUM CHLORIDE 0.9 % IV SOLN
INTRAVENOUS | Status: DC | PRN
  Administered 2018-12-13: 15 ug/min via INTRAVENOUS

## 2018-12-13 MED ORDER — KETAMINE HCL 50 MG/ML IJ SOLN: INTRAMUSCULAR | Status: DC | PRN

## 2018-12-13 MED ORDER — ROCURONIUM BROMIDE 50 MG/5ML IV SOLN
INTRAVENOUS | Status: AC
  Filled 2018-12-13: qty 1

## 2018-12-13 MED ORDER — SUGAMMADEX SODIUM 200 MG/2ML IV SOLN
INTRAVENOUS | Status: DC | PRN
  Administered 2018-12-13: 200 mg via INTRAVENOUS

## 2018-12-13 MED ORDER — HEPARIN SODIUM (PORCINE) 5000 UNIT/ML IJ SOLN
5000.0000 [IU] | INTRAMUSCULAR | Status: AC
  Administered 2018-12-13: 5000 [IU] via SUBCUTANEOUS

## 2018-12-13 MED ORDER — HYDROCODONE-ACETAMINOPHEN 5-325 MG PO TABS
1.0000 | ORAL_TABLET | ORAL | Status: DC | PRN
  Administered 2018-12-13: 2 via ORAL
  Filled 2018-12-13: qty 2

## 2018-12-13 MED ORDER — SIMVASTATIN 40 MG PO TABS
40.0000 mg | ORAL_TABLET | Freq: Every day | ORAL | Status: DC
  Administered 2018-12-14: 40 mg via ORAL
  Filled 2018-12-13: qty 1

## 2018-12-13 MED ORDER — VANCOMYCIN HCL IN DEXTROSE 1-5 GM/200ML-% IV SOLN
1000.0000 mg | Freq: Once | INTRAVENOUS | Status: AC
  Administered 2018-12-13: 1000 mg via INTRAVENOUS

## 2018-12-13 MED ORDER — CIPROFLOXACIN IN D5W 400 MG/200ML IV SOLN
INTRAVENOUS | Status: AC
  Filled 2018-12-13: qty 200

## 2018-12-13 MED ORDER — ONDANSETRON HCL 4 MG/2ML IJ SOLN: 4.0000 mg | Freq: Once | INTRAMUSCULAR | Status: DC | PRN

## 2018-12-13 MED ORDER — ALBUTEROL SULFATE HFA 108 (90 BASE) MCG/ACT IN AERS: 2.0000 | INHALATION_SPRAY | Freq: Four times a day (QID) | RESPIRATORY_TRACT | Status: DC | PRN

## 2018-12-13 MED ORDER — KETAMINE HCL 50 MG/ML IJ SOLN
INTRAMUSCULAR | Status: AC
  Filled 2018-12-13: qty 10

## 2018-12-13 MED ORDER — NITROFURANTOIN MONOHYD MACRO 100 MG PO CAPS
100.0000 mg | ORAL_CAPSULE | Freq: Two times a day (BID) | ORAL | Status: DC
  Administered 2018-12-13 – 2018-12-14 (×2): 100 mg via ORAL
  Filled 2018-12-13 (×3): qty 1

## 2018-12-13 MED ORDER — BELLADONNA ALKALOIDS-OPIUM 16.2-60 MG RE SUPP
1.0000 | Freq: Four times a day (QID) | RECTAL | Status: DC | PRN
  Filled 2018-12-13: qty 1

## 2018-12-13 MED ORDER — ROCURONIUM BROMIDE 100 MG/10ML IV SOLN
INTRAVENOUS | Status: DC | PRN
  Administered 2018-12-13 (×2): 10 mg via INTRAVENOUS
  Administered 2018-12-13: 20 mg via INTRAVENOUS

## 2018-12-13 MED ORDER — VANCOMYCIN HCL IN DEXTROSE 1-5 GM/200ML-% IV SOLN
INTRAVENOUS | Status: AC
  Administered 2018-12-13: 1000 mg via INTRAVENOUS
  Filled 2018-12-13: qty 200

## 2018-12-13 MED ORDER — NYSTATIN 100000 UNIT/GM EX POWD
Freq: Two times a day (BID) | CUTANEOUS | Status: DC
  Administered 2018-12-13 – 2018-12-14 (×2): via TOPICAL
  Filled 2018-12-13: qty 15

## 2018-12-13 MED ORDER — FENTANYL CITRATE (PF) 100 MCG/2ML IJ SOLN
INTRAMUSCULAR | Status: AC
  Filled 2018-12-13: qty 2

## 2018-12-13 MED ORDER — CEFAZOLIN SODIUM-DEXTROSE 2-4 GM/100ML-% IV SOLN
INTRAVENOUS | Status: AC
  Filled 2018-12-13: qty 100

## 2018-12-13 MED ORDER — ETOMIDATE 2 MG/ML IV SOLN
INTRAVENOUS | Status: AC
  Filled 2018-12-13: qty 10

## 2018-12-13 MED ORDER — KETAMINE HCL 50 MG/ML IJ SOLN
INTRAMUSCULAR | Status: DC | PRN
  Administered 2018-12-13: 25 mg via INTRAMUSCULAR

## 2018-12-13 MED ORDER — HYDROMORPHONE HCL 1 MG/ML IJ SOLN: 0.5000 mg | INTRAMUSCULAR | Status: DC | PRN

## 2018-12-13 MED ORDER — SODIUM CHLORIDE 0.9% FLUSH
3.0000 mL | Freq: Two times a day (BID) | INTRAVENOUS | Status: DC
  Administered 2018-12-14: 3 mL via INTRAVENOUS

## 2018-12-13 MED ORDER — HEPARIN SODIUM (PORCINE) 5000 UNIT/ML IJ SOLN
INTRAMUSCULAR | Status: AC
  Administered 2018-12-13: 5000 [IU] via SUBCUTANEOUS
  Filled 2018-12-13: qty 1

## 2018-12-13 MED ORDER — HEPARIN SODIUM (PORCINE) 5000 UNIT/ML IJ SOLN
5000.0000 [IU] | Freq: Two times a day (BID) | INTRAMUSCULAR | Status: DC
  Administered 2018-12-13 – 2018-12-14 (×2): 5000 [IU] via SUBCUTANEOUS
  Filled 2018-12-13 (×2): qty 1

## 2018-12-13 MED ORDER — EPHEDRINE SULFATE 50 MG/ML IJ SOLN
INTRAMUSCULAR | Status: AC
  Filled 2018-12-13: qty 1

## 2018-12-13 MED ORDER — ALBUTEROL SULFATE (2.5 MG/3ML) 0.083% IN NEBU: 2.5000 mg | INHALATION_SOLUTION | Freq: Four times a day (QID) | RESPIRATORY_TRACT | Status: DC | PRN

## 2018-12-13 MED ORDER — SODIUM CHLORIDE 0.9% FLUSH: 3.0000 mL | INTRAVENOUS | Status: DC | PRN

## 2018-12-13 MED ORDER — EPHEDRINE SULFATE 50 MG/ML IJ SOLN
INTRAMUSCULAR | Status: DC | PRN
  Administered 2018-12-13: 15 mg via INTRAVENOUS

## 2018-12-13 MED ORDER — LIDOCAINE HCL (PF) 2 % IJ SOLN
INTRAMUSCULAR | Status: AC
  Filled 2018-12-13: qty 10

## 2018-12-13 MED ORDER — POLYETHYLENE GLYCOL 3350 17 G PO PACK: 17.0000 g | PACK | Freq: Every day | ORAL | Status: DC | PRN

## 2018-12-13 MED ORDER — FENTANYL CITRATE (PF) 100 MCG/2ML IJ SOLN: 25.0000 ug | INTRAMUSCULAR | Status: DC | PRN

## 2018-12-13 MED ORDER — RISAQUAD PO CAPS
1.0000 | ORAL_CAPSULE | Freq: Every day | ORAL | Status: DC
  Administered 2018-12-14: 1 via ORAL
  Filled 2018-12-13: qty 1

## 2018-12-13 MED ORDER — ONDANSETRON HCL 4 MG/2ML IJ SOLN
INTRAMUSCULAR | Status: AC
  Filled 2018-12-13: qty 2

## 2018-12-13 MED ORDER — SODIUM CHLORIDE 0.9 % IV SOLN: 250.0000 mL | INTRAVENOUS | Status: DC | PRN

## 2018-12-13 MED ORDER — ONDANSETRON HCL 4 MG/2ML IJ SOLN
INTRAMUSCULAR | Status: DC | PRN
  Administered 2018-12-13: 4 mg via INTRAVENOUS

## 2018-12-13 MED ORDER — FAMOTIDINE 20 MG PO TABS
ORAL_TABLET | ORAL | Status: AC
  Administered 2018-12-13: 20 mg via ORAL
  Filled 2018-12-13: qty 1

## 2018-12-13 MED ORDER — ACETAMINOPHEN 325 MG PO TABS: 650.0000 mg | ORAL_TABLET | Freq: Four times a day (QID) | ORAL | Status: DC | PRN

## 2018-12-13 MED ORDER — BELLADONNA ALKALOIDS-OPIUM 16.2-60 MG RE SUPP: 1.0000 | Freq: Four times a day (QID) | RECTAL | Status: DC | PRN

## 2018-12-13 MED ORDER — ONDANSETRON HCL 4 MG/2ML IJ SOLN: 4.0000 mg | INTRAMUSCULAR | Status: DC | PRN

## 2018-12-13 MED ORDER — FENTANYL CITRATE (PF) 100 MCG/2ML IJ SOLN
INTRAMUSCULAR | Status: DC | PRN
  Administered 2018-12-13 (×2): 25 ug via INTRAVENOUS

## 2018-12-13 MED ORDER — PHENYLEPHRINE HCL 10 MG/ML IJ SOLN
INTRAMUSCULAR | Status: DC | PRN
  Administered 2018-12-13: 50 ug via INTRAVENOUS

## 2018-12-13 MED ORDER — LIDOCAINE HCL (CARDIAC) PF 100 MG/5ML IV SOSY
PREFILLED_SYRINGE | INTRAVENOUS | Status: DC | PRN
  Administered 2018-12-13: 100 mg via INTRAVENOUS

## 2018-12-13 MED ORDER — ETOMIDATE 2 MG/ML IV SOLN: INTRAVENOUS | Status: DC | PRN

## 2018-12-13 MED ORDER — FAMOTIDINE 20 MG PO TABS
20.0000 mg | ORAL_TABLET | Freq: Once | ORAL | Status: AC
  Administered 2018-12-13: 20 mg via ORAL

## 2018-12-13 MED ORDER — SODIUM CHLORIDE FLUSH 0.9 % IV SOLN
INTRAVENOUS | Status: AC
  Filled 2018-12-13: qty 10

## 2018-12-13 MED ORDER — NYSTATIN 100000 UNIT/GM EX CREA
1.0000 "application " | TOPICAL_CREAM | Freq: Two times a day (BID) | CUTANEOUS | Status: DC
  Administered 2018-12-13 – 2018-12-14 (×2): 1 via TOPICAL
  Filled 2018-12-13: qty 15

## 2018-12-13 MED ORDER — SUGAMMADEX SODIUM 200 MG/2ML IV SOLN
INTRAVENOUS | Status: AC
  Filled 2018-12-13: qty 2

## 2018-12-13 MED ORDER — CARVEDILOL 3.125 MG PO TABS
3.1250 mg | ORAL_TABLET | Freq: Two times a day (BID) | ORAL | Status: DC
  Administered 2018-12-13: 3.125 mg via ORAL
  Filled 2018-12-13 (×3): qty 1

## 2018-12-13 MED ORDER — SODIUM CHLORIDE 0.9 % IR SOLN
3000.0000 mL | Status: DC
  Administered 2018-12-13: 3000 mL

## 2018-12-13 MED ORDER — LACTATED RINGERS IV SOLN
INTRAVENOUS | Status: DC
  Administered 2018-12-13: 08:00:00 via INTRAVENOUS

## 2018-12-13 MED ORDER — ETOMIDATE 2 MG/ML IV SOLN
INTRAVENOUS | Status: DC | PRN
  Administered 2018-12-13: 20 mg via INTRAVENOUS

## 2018-12-13 MED ORDER — CEFAZOLIN SODIUM-DEXTROSE 2-4 GM/100ML-% IV SOLN: 2.0000 g | INTRAVENOUS | Status: DC

## 2018-12-13 MED ORDER — TIOTROPIUM BROMIDE MONOHYDRATE 18 MCG IN CAPS
18.0000 ug | ORAL_CAPSULE | Freq: Every day | RESPIRATORY_TRACT | Status: DC
  Administered 2018-12-14: 18 ug via RESPIRATORY_TRACT
  Filled 2018-12-13: qty 5

## 2018-12-13 SURGICAL SUPPLY — 27 items
ADAPTER IRRIG TUBE 2 SPIKE SOL (ADAPTER) ×8 IMPLANT
BAG DRAIN CYSTO-URO LG1000N (MISCELLANEOUS) ×4 IMPLANT
BAG URO DRAIN 4000ML (MISCELLANEOUS) ×4 IMPLANT
CATH FOLEY 3WAY 30CC 22FR (CATHETERS) IMPLANT
CATH FOLEY 3WAY 30CC 24FR (CATHETERS) ×2
CATH URTH STD 24FR FL 3W 2 (CATHETERS) IMPLANT
DRAPE UTILITY 15X26 TOWEL STRL (DRAPES) ×4 IMPLANT
DRSG TELFA 4X3 1S NADH ST (GAUZE/BANDAGES/DRESSINGS) ×2 IMPLANT
ELECT BIVAP BIPO 22/24 DONUT (ELECTROSURGICAL) ×4
ELECT LOOP 22F BIPOLAR SML (ELECTROSURGICAL)
ELECTRD BIVAP BIPO 22/24 DONUT (ELECTROSURGICAL) IMPLANT
ELECTRODE LOOP 22F BIPOLAR SML (ELECTROSURGICAL) IMPLANT
GLOVE BIOGEL PI IND STRL 7.5 (GLOVE) ×2 IMPLANT
GLOVE BIOGEL PI INDICATOR 7.5 (GLOVE) ×2
GOWN STRL REUS W/ TWL LRG LVL3 (GOWN DISPOSABLE) ×2 IMPLANT
GOWN STRL REUS W/ TWL XL LVL3 (GOWN DISPOSABLE) ×2 IMPLANT
GOWN STRL REUS W/TWL LRG LVL3 (GOWN DISPOSABLE) ×2
GOWN STRL REUS W/TWL XL LVL3 (GOWN DISPOSABLE) ×2
HOLDER FOLEY CATH W/STRAP (MISCELLANEOUS) ×4 IMPLANT
KIT TURNOVER CYSTO (KITS) ×4 IMPLANT
LOOP CUT BIPOLAR 24F LRG (ELECTROSURGICAL) ×2 IMPLANT
PACK CYSTO AR (MISCELLANEOUS) ×4 IMPLANT
SET IRRIG Y TYPE TUR BLADDER L (SET/KITS/TRAYS/PACK) ×4 IMPLANT
SOL .9 NS 3000ML IRR  AL (IV SOLUTION) ×18
SOL .9 NS 3000ML IRR UROMATIC (IV SOLUTION) ×12 IMPLANT
SYRINGE IRR TOOMEY STRL 70CC (SYRINGE) ×4 IMPLANT
WATER STERILE IRR 1000ML POUR (IV SOLUTION) ×4 IMPLANT

## 2018-12-13 NOTE — Anesthesia Post-op Follow-up Note (Signed)
Anesthesia QCDR form completed.        

## 2018-12-13 NOTE — Anesthesia Preprocedure Evaluation (Addendum)
Anesthesia Evaluation  Patient identified by MRN, date of birth, ID band Patient awake    Reviewed: Allergy & Precautions, NPO status , Patient's Chart, lab work & pertinent test results  Airway Mallampati: II  TM Distance: >3 FB     Dental   Pulmonary COPD, former smoker,    Pulmonary exam normal        Cardiovascular +CHF and + DVT  Normal cardiovascular exam     Neuro/Psych CVA negative psych ROS   GI/Hepatic Neg liver ROS,   Endo/Other  negative endocrine ROS  Renal/GU Renal InsufficiencyRenal disease     Musculoskeletal   Abdominal Normal abdominal exam  (+)   Peds negative pediatric ROS (+)  Hematology  (+) anemia ,   Anesthesia Other Findings Past Medical History: No date: Anemia     Comment:  iron deficiency No date: COPD (chronic obstructive pulmonary disease) (HCC) 01/2018: DVT (deep venous thrombosis) (HCC) No date: Hyperlipidemia No date: Restless leg 03/2018: Stroke (HCC) EF 20%  Reproductive/Obstetrics                            Anesthesia Physical Anesthesia Plan  ASA: IV  Anesthesia Plan: General   Post-op Pain Management:    Induction: Intravenous  PONV Risk Score and Plan:   Airway Management Planned: Oral ETT  Additional Equipment:   Intra-op Plan:   Post-operative Plan: Extubation in OR  Informed Consent: I have reviewed the patients History and Physical, chart, labs and discussed the procedure including the risks, benefits and alternatives for the proposed anesthesia with the patient or authorized representative who has indicated his/her understanding and acceptance.     Dental advisory given  Plan Discussed with: CRNA and Surgeon  Anesthesia Plan Comments:         Anesthesia Quick Evaluation

## 2018-12-13 NOTE — Progress Notes (Signed)
Ch visited w/ pt pre-op with wife and pastor present. Pt had a positive affect while conversing w/ ch- did not seemed to be anxious. The pt and the clergy were thankful for the visit. Ch allow the clergy that was present to pray for pt before the procedure.     12/13/18 0800  Clinical Encounter Type  Visited With Patient and family together;Other (Comment) (clergy present )  Visit Type Psychological support;Spiritual support;Social support;Pre-op  Spiritual Encounters  Spiritual Needs Emotional;Grief support  Stress Factors  Patient Stress Factors None identified  Family Stress Factors None identified

## 2018-12-13 NOTE — H&P (Signed)
    Allen Potter 02/27/1927 [id number]  HPI: Allen Potter is a comorbid 85 year old male with history of COPD, DVT, and stroke and history of Foley dependent urinary retention since August 2019.  He failed multiple voiding trials despite maximal medical therapy with Flomax and finasteride.  He has had a urodynamic study that showed high pressure, low flow voiding consistent with bladder outlet obstruction and decreased compliance.  Prostate measures 34 cc on transrectal ultrasound.  Unfortunately he has developed significant urethral erosion from his chronic Foley.  He denies any chest pain or shortness of breath.   PMH: Past Medical History:  Diagnosis Date  . Anemia    iron deficiency  . COPD (chronic obstructive pulmonary disease) (HCC)   . DVT (deep venous thrombosis) (HCC) 01/2018  . Hyperlipidemia   . Restless leg   . Stroke [facility]) 03/2018    Surgical History: Past Surgical History:  Procedure Laterality Date  . CATARACT EXTRACTION Bilateral 2015     Allergies: No Known Allergies  Family History: History reviewed. No pertinent family history.  Social History:  reports that he quit smoking about 20 years ago. His smoking use included cigarettes. He smoked 0.25 packs per day. He has never used smokeless tobacco. He reports previous alcohol use. No history on file for drug.  ROS: Negative aside from those stated in the HPI  Physical Exam: BP (!) 153/74   Pulse 88   Temp 97.6 F (36.4 C) (Tympanic)   Resp 17   Ht 5\' 9"  (1.753 m)   Wt 65.4 kg   SpO2 99%   BMI 21.29 kg/m    Constitutional:  Alert and oriented, No acute distress. Cardiovascular: Regular rate and rhythm Respiratory: Clear to auscultation bilaterally GI: Abdomen is soft, nontender, nondistended, no abdominal masses Lymph: No cervical or inguinal lymphadenopathy. Skin: No rashes, bruises or suspicious lesions. Neurologic: Grossly intact, no focal deficits, moving all 4 extremities. Psychiatric:  Normal mood and affect.  Laboratory Data: Urine culture 2/4 with enterococcus and staph aureus, has been on culture appropriate nitrofurantoin  Assessment & Plan:   In summary the patient is a comorbid 85 year old male with history of COPD, DVT, and stroke with Foley dependent urinary retention since August 2019.  Unfortunately, he developed significant urethral erosion since that time.  He has a 34 cc prostate on transrectal ultrasound, and urodynamic studies showed a functional bladder with bladder outlet obstruction.  We have discussed at length the risks and benefits of surgery, and we have delayed surgery until he was 1 year out from his stroke to minimize risk.  We discussed at length the risks and benefits of proceeding with bipolar TURP.  We specifically discussed the risks of bleeding, infection, need for additional procedures, urinary incontinence, recurrent retention, stroke, MI, and even death.  He is willing to accept these risks to be catheter free.  Proceed with TURP  Sondra Come, MD  [facility] [address], [address] [place], [place] [address] [phone]

## 2018-12-13 NOTE — Care Management Obs Status (Signed)
MEDICARE OBSERVATION STATUS NOTIFICATION   Patient Details  Name: Allen Potter MRN: [id number] Date of Birth: April 08, 1927   Medicare Observation Status Notification Given:  Yes    Chapman Fitch, RN 12/13/2018, 4:39 PM

## 2018-12-13 NOTE — Transfer of Care (Signed)
Immediate Anesthesia Transfer of Care Note  Patient: Allen Potter  Procedure(s) Performed: TRANSURETHRAL RESECTION OF THE PROSTATE (TURP) (N/A Prostate) CYSTOSCOPY WITH BLADDER BIOPSY (N/A Bladder)  Patient Location: PACU  Anesthesia Type:General  Level of Consciousness: drowsy  Airway & Oxygen Therapy: Patient Spontanous Breathing and Patient connected to face mask oxygen  Post-op Assessment: Report given to RN and Post -op Vital signs reviewed and stable  Post vital signs: Reviewed and stable  Last Vitals:  Vitals Value Taken Time  BP 160/81 12/13/2018 10:20 AM  Temp 37.2 C 12/13/2018 10:20 AM  Pulse 63 12/13/2018 10:22 AM  Resp 13 12/13/2018 10:22 AM  SpO2 100 % 12/13/2018 10:22 AM  Vitals shown include unvalidated device data.  Last Pain:  Vitals:   12/13/18 1020  TempSrc: Temporal  PainSc:          Complications: No apparent anesthesia complications

## 2018-12-13 NOTE — Op Note (Signed)
Date of procedure: 12/13/18  Preoperative diagnosis:  1. BPH with urinary obstruction and retention  Postoperative diagnosis:  1. Same  Procedure: 1. TURP, bladder biopsy and fulguration  Surgeon: Legrand Rams, MD  Anesthesia: General  Complications: None  Intraoperative findings:  1.  Significant urethral erosion to the mid penile shaft 2.  Moderate prostate with high bladder neck and median lobe 3.  Ureteral orifices orthotopic bilaterally 4.  Significant bullous edema at posterior wall and dome, likely secondary to catheter.  Small biopsy taken and fulgurated 5.  Excellent hemostasis at conclusion of procedure  EBL: Minimal  Specimens:  1.  Prostate chips 2.  Bladder biopsy  Drains: 24 French three-way Foley, 40 cc in balloon  Indication: Allen Potter is a 85 y.o. patient with Foley dependent urinary retention.  He is developed significant urethral erosion over the last 6 months.  He had urodynamics performed that showed a functional bladder with outlet obstruction, and rectal ultrasound showed 34 cc prostate.  After reviewing the management options for treatment, they elected to proceed with the above surgical procedure(s). We have discussed the potential benefits and risks of the procedure, side effects of the proposed treatment, the likelihood of the patient achieving the goals of the procedure, and any potential problems that might occur during the procedure or recuperation. Informed consent has been obtained.  Description of procedure:  The patient was taken to the operating room and general anesthesia was induced.  SCDs were placed for DVT prophylaxis, and 5000 units subcutaneous heparin were given with his history of stroke/DVT.  The patient was placed in the dorsal lithotomy position, prepped and draped in the usual sterile fashion, and preoperative antibiotics(vancomycin and Cipro) were administered. A preoperative time-out was performed.   On exam, there was  significant urethral erosion to the mid penile shaft.  There was also a significant rash in the groin folds.  The visual obturator was used to insert the 26 French resectoscope into the bladder.  The prostate was moderate in size with a high bladder neck and an obstructing median lobe.  The ureteral orifices were orthotopic bilaterally.  There was significant bullous edema at the posterior wall/dome, likely secondary to the long-term chronic Foley.  However, this was abnormal enough that I did feel it warranted biopsy at the conclusion of the case.  Using the large bipolar resecting loop the bladder neck was lowered in the median lobe removed.  I then circumferentially resected the prostate down to the capsule, and obtained meticulous hemostasis.  The prostate chips were irrigated free from the bladder.  The cold cup biopsy forceps were used to take a biopsy of the bullous edema at the posterior wall.  The bipolar button was then used to achieve hemostasis at the biopsy site.  Under low flow conditions, the prostatic fossa was meticulously inspected and excellent hemostasis achieved using the bipolar button.  I was able to swing easily from one ureteral orifice to the other with the lowered bladder neck.  A 24 French three-way catheter passed easily into the bladder with return of clear fluid.  A total of 40 cc were placed in the balloon.  Urine was clear on medium CBI.  Disposition: Stable to PACU  Plan: Admit overnight, continue CBI, likely discharge tomorrow and follow-up in clinic on Monday for Foley removal Resume anticoagulation in 5 days if urine is clear Will follow-up pathology results  Legrand Rams, MD

## 2018-12-13 NOTE — Anesthesia Procedure Notes (Signed)
Procedure Name: Intubation Date/Time: 12/13/2018 8:53 AM Performed by: Alvin Critchley, MD Pre-anesthesia Checklist: Patient identified, Emergency Drugs available, Suction available, Patient being monitored and Timeout performed Patient Re-evaluated:Patient Re-evaluated prior to induction Oxygen Delivery Method: Circle system utilized Preoxygenation: Pre-oxygenation with 100% oxygen Induction Type: IV induction Ventilation: Oral airway inserted - appropriate to patient size Laryngoscope Size: Mac and 4 Grade View: Grade I Tube type: Oral Tube size: 7.5 mm Number of attempts: 1 Airway Equipment and Method: Stylet Placement Confirmation: ETT inserted through vocal cords under direct vision Secured at: 23 cm Tube secured with: Tape Dental Injury: Teeth and Oropharynx as per pre-operative assessment

## 2018-12-14 DIAGNOSIS — N401 Enlarged prostate with lower urinary tract symptoms: Secondary | ICD-10-CM | POA: Diagnosis not present

## 2018-12-14 LAB — CBC
HCT: 30 % — ABNORMAL LOW (ref 39.0–52.0)
HEMOGLOBIN: 9.1 g/dL — AB (ref 13.0–17.0)
MCH: 25.9 pg — ABNORMAL LOW (ref 26.0–34.0)
MCHC: 30.3 g/dL (ref 30.0–36.0)
MCV: 85.2 fL (ref 80.0–100.0)
Platelets: 260 10*3/uL (ref 150–400)
RBC: 3.52 MIL/uL — AB (ref 4.22–5.81)
RDW: 15 % (ref 11.5–15.5)
WBC: 8 10*3/uL (ref 4.0–10.5)
nRBC: 0 % (ref 0.0–0.2)

## 2018-12-14 NOTE — Discharge Instructions (Signed)

## 2018-12-14 NOTE — Progress Notes (Signed)
CBI stopped as per md's orders, urine clear pink, 1 small clot noted. Pt states no discomfort

## 2018-12-14 NOTE — Discharge Summary (Signed)
Physician Discharge Summary  Patient ID: Allen Potter MRN: [id number] DOB/AGE: [birth year] 85 y.o.  Admit date: 12/13/2018 Discharge date: 12/14/2018  Admission Diagnoses:  Discharge Diagnoses:  Active Problems:   BPH with obstruction/lower urinary tract symptoms   Discharged Condition: good  Hospital Course: 85 year old male status post TURP yesterday.  He remained overnight for observation on continuous bladder irrigation.  This was able to be stopped his urine was clear yellow this morning.  He was stable and ready for discharge.  Consults: None  Significant Diagnostic Studies: None  Treatments: surgery: TURP  Discharge Exam: Blood pressure (!) 100/53, pulse 70, temperature 98.2 F (36.8 C), temperature source Oral, resp. rate 16, height 5\' 9"  (1.753 m), weight 65.4 kg, SpO2 94 %. General appearance: alert, no acute distress Adequate perfusion of extremities nonLabored respiration, symmetrical chest rise Foley catheter draining clear yellow urine  Disposition: Discharge disposition: 01-Home or Self Care       Discharge Instructions    Discharge instructions   Complete by:  As directed    You underwent transurethral resection of the prostate.  Drink plenty of fluids to keep the catheter flushed over the weekend.  Follow-up in clinic on Monday for Foley removal.  No strenuous activity for 1 week.  Call the clinic or present to the emergency department if you have fever over 101.3, or large blood clots in the catheter, or nondraining Foley catheter.  Okay to resume blood thinners in 5 days.  Do not resume blood thinners if urine is still bloody, and call the clinic.     Allergies as of 12/14/2018   No Known Allergies     Medication List    STOP taking these medications   apixaban 5 MG Tabs tablet Commonly known as:  ELIQUIS   ferrous sulfate 325 (65 FE) MG tablet   finasteride 5 MG tablet Commonly known as:  PROSCAR   nitrofurantoin  (macrocrystal-monohydrate) 100 MG capsule Commonly known as:  MACROBID   tamsulosin 0.4 MG Caps capsule Commonly known as:  FLOMAX   vitamin B-12 500 MCG tablet Commonly known as:  CYANOCOBALAMIN     TAKE these medications   acetaminophen 325 MG tablet Commonly known as:  TYLENOL Take 2 tablets (650 mg total) by mouth every 6 (six) hours as needed for mild pain (or Fever >/= 101).   acidophilus Caps capsule Take 1 capsule by mouth daily.   BREO ELLIPTA 100-25 MCG/INH Aepb Generic drug:  fluticasone furoate-vilanterol Inhale 1 puff into the lungs daily.   carvedilol 3.125 MG tablet Commonly known as:  COREG Take 3.125 mg by mouth 2 (two) times daily.   HYDROcodone-acetaminophen 5-325 MG tablet Commonly known as:  NORCO/VICODIN Take 1-2 tablets by mouth every 4 (four) hours as needed for up to 5 days for moderate pain.   nitrofurantoin 100 MG capsule Commonly known as:  MACRODANTIN Take 1 capsule (100 mg total) by mouth 2 (two) times daily for 3 days.   nystatin cream Commonly known as:  MYCOSTATIN Apply 1 application topically 2 (two) times daily.   polyethylene glycol packet Commonly known as:  MIRALAX / GLYCOLAX Take 17 g by mouth daily as needed for moderate constipation.   simvastatin 40 MG tablet Commonly known as:  ZOCOR Take 1 tablet (40 mg total) by mouth daily at 6 PM. What changed:  when to take this   SPIRIVA HANDIHALER 18 MCG inhalation capsule Generic drug:  tiotropium Place 1 puff into inhaler and inhale daily.   VENTOLIN  HFA 108 (90 Base) MCG/ACT inhaler Generic drug:  albuterol Inhale 2 puffs into the lungs every 6 (six) hours as needed for wheezing or shortness of breath.        Signed: Ray Church, III 12/14/2018, 11:25 AM

## 2018-12-15 NOTE — Anesthesia Postprocedure Evaluation (Signed)
Anesthesia Post Note  Patient: Allen Potter  Procedure(s) Performed: TRANSURETHRAL RESECTION OF THE PROSTATE (TURP) (N/A Prostate) CYSTOSCOPY WITH BLADDER BIOPSY (N/A Bladder)  Patient location during evaluation: PACU Anesthesia Type: General Level of consciousness: awake and alert and oriented Pain management: pain level controlled Vital Signs Assessment: post-procedure vital signs reviewed and stable Respiratory status: spontaneous breathing Cardiovascular status: blood pressure returned to baseline Anesthetic complications: no     Last Vitals:  Vitals:   12/14/18 1020 12/14/18 1330  BP: (!) 100/53 (!) 105/55  Pulse: 70 80  Resp:  18  Temp:  36.9 C  SpO2:  94%    Last Pain:  Vitals:   12/14/18 1330  TempSrc: Oral  PainSc: 0-No pain                 Kanyah Matsushima

## 2018-12-16 ENCOUNTER — Ambulatory Visit (INDEPENDENT_AMBULATORY_CARE_PROVIDER_SITE_OTHER): Payer: Medicare Other

## 2018-12-16 DIAGNOSIS — N401 Enlarged prostate with lower urinary tract symptoms: Secondary | ICD-10-CM

## 2018-12-16 DIAGNOSIS — N138 Other obstructive and reflux uropathy: Secondary | ICD-10-CM | POA: Diagnosis not present

## 2018-12-16 LAB — SURGICAL PATHOLOGY

## 2018-12-16 NOTE — Progress Notes (Signed)
Bladder Scan Patient can void: 140 ml Performed By: Eligha Bridegroom, CMA  Patient voided of amber urine into a urinal, ok to follow up in 4wk w/PVR per Dr. Richardo Hanks

## 2018-12-17 ENCOUNTER — Telehealth: Payer: Self-pay

## 2018-12-17 NOTE — Telephone Encounter (Signed)
Called pt, no answer. Left detailed message informing pt of the information below. Advised pt to call back for questions or concerns.  

## 2018-12-17 NOTE — Telephone Encounter (Signed)
-----   Message from Sondra Come, MD sent at 12/17/2018  9:05 AM EST ----- Please let him know his prostate chips and bladder biopsy were all negative, no cancer seen. This is great news. Continue to drink plenty of fluids to flush the bladder, and follow up in 4 weeks in clinic as scheduled.  Legrand Rams, MD 12/17/2018

## 2018-12-18 ENCOUNTER — Ambulatory Visit (INDEPENDENT_AMBULATORY_CARE_PROVIDER_SITE_OTHER): Payer: Medicare Other

## 2018-12-18 DIAGNOSIS — R339 Retention of urine, unspecified: Secondary | ICD-10-CM

## 2018-12-18 NOTE — Patient Instructions (Addendum)
Transurethral Resection of the Prostate, Care After °Refer to this sheet in the next few weeks. These instructions provide you with information about caring for yourself after your procedure. Your health care provider may also give you more specific instructions. Your treatment has been planned according to current medical practices, but problems sometimes occur. Call your health care provider if you have any problems or questions after your procedure. °What can I expect after the procedure? °After the procedure, it is common to have: °· Mild pain in your lower abdomen. °· Soreness or mild discomfort in your penis from having the catheter inserted during the procedure. °· A feeling of urgency when you need to urinate. °· A small amount of blood in your urine. You may notice some small blood clots in your urine. These are normal. °Follow these instructions at home: °Medicines ° °· Take over-the-counter and prescription medicines only as told by your health care provider. °· Do not drive or operate heavy machinery while taking prescription pain medicine. °· Do not drive for 24 hours if you received a sedative. °· If you were prescribed antibiotic medicine, take it as told by your health care provider. Do not stop taking the antibiotic even if you start to feel better. °Activity °· Return to your normal activities as told by your health care provider. Ask your health care provider what activities are safe for you. °· Do not lift anything that is heavier than 10 lb (4.5 kg) for 3 weeks after your procedure, or as long as told by your health care provider. °· Avoid intense physical activity for as long as told by your health care provider. °· Avoid sitting for a long time without moving. Get up and move around one or more times every few hours. This helps to prevent blood clots. You may increase your physical activity gradually as you start to feel better. °Lifestyle °· Do not drink alcohol for as long as told by your  health care provider. This is especially important if you are taking prescription pain medicines. °· Do not engage in sexual activity until your health care provider says that you can do this. °General instructions °· Do not take baths, swim, or use a hot tub until your health care provider approves. °· Drink enough fluid to keep your urine clear or pale yellow. °· Urinate as soon as you feel the need to. Do not try to hold your urine for long periods of time. °· If your health care provider approves, you may take a stool softener for 2-3 weeks to prevent you from straining to have a bowel movement. °· Wear compression stockings as told by your health care provider. These stockings help to prevent blood clots and reduce swelling in your legs. °· Keep all follow-up visits as told by your health care provider. This is important. °Contact a health care provider if: °· You have difficulty urinating. °· You have a fever. °· You have pain that gets worse or does not improve with medicine. °· You have blood in your urine that does not go away after 1 week of resting and drinking more fluids. °· You have swelling in your penis or testicles. °Get help right away if: °· You are unable to urinate. °· You are having more blood clots in your urine instead of fewer. °· You have: °? Large blood clots. °? A lot of blood in your urine. °? Pain in your back or lower abdomen. °? Pain or swelling in your legs. °?   Chills and you are shaking. °This information is not intended to replace advice given to you by your health care provider. Make sure you discuss any questions you have with your health care provider. °Document Released: 10/16/2005 Document Revised: 11/29/2016 Document Reviewed: 07/08/2015 °Elsevier Interactive Patient Education © 2019 [employer] ° °

## 2018-12-18 NOTE — Progress Notes (Signed)
Bladder Irrigation  Due to suspected clot retention patient is present today for a bladder irrigation. Patient was cleaned and prepped in a sterile fashion. 200 ml of saline/sterile water was instilled and irrigated into the bladder with a 22ml Toomey syringe through a 6 eyes cathether. was drained from the bladder, urine was dark red with no clots. After irrigation urine flow was noted no complications were noted, urine was clear with some pink tinge. Per Dr. Apolinar Junes a 18FR catheter was placed with a leg bag for drainage, patient was prepped in a sterile fashion no complications were noted. Patient tolerated well.    Preformed by: Debbe Bales, CMA and Eligha Bridegroom, CMA Additional notes/ Follow up: RTC in 1 week for voiding trial   Upon leaving the clinic patient became very dizzy and felt faint. BP was 98/53 P66. Patient states that he has had nothing to eat or drink today. Denies being diabetic. Patient given apple juice and peanut butter crackers, after some time patient felt better. BP improved to 115/66. Advised pt go to ED for evaluation as he was disoriented initially. Patient declined. Used wheelchair to take patient to his car. Wife is driving.

## 2018-12-18 NOTE — Progress Notes (Signed)
Fill and Pull Catheter Removal  Patient is present today for a catheter removal.  Patient was cleaned and prepped in a sterile fashion of sterile water/ saline was instilled into the bladder when the patient felt the urge to urinate. 72ml of water was then drained from the balloon.  A 24FR foley cath was removed from the bladder no complications were noted.  Patient as then given some time to void on their own.  Patient can void  on their own after some time.  Patient tolerated well.  Preformed by: Debbe Bales, CMA  Follow up/ Additional notes: RTC if unable to void by 2pm

## 2018-12-26 ENCOUNTER — Ambulatory Visit: Payer: Medicare Other

## 2018-12-27 ENCOUNTER — Encounter: Payer: Self-pay | Admitting: Family Medicine

## 2018-12-27 ENCOUNTER — Ambulatory Visit (INDEPENDENT_AMBULATORY_CARE_PROVIDER_SITE_OTHER): Payer: Medicare Other | Admitting: Family Medicine

## 2018-12-27 VITALS — BP 112/63 | HR 87

## 2018-12-27 DIAGNOSIS — R339 Retention of urine, unspecified: Secondary | ICD-10-CM | POA: Diagnosis not present

## 2018-12-27 MED ORDER — TAMSULOSIN HCL 0.4 MG PO CAPS: 0.4000 mg | ORAL_CAPSULE | Freq: Every day | ORAL | 0 refills | Status: DC

## 2018-12-27 NOTE — Progress Notes (Signed)
Fill and Pull Catheter Removal  Patient is present today for a catheter removal.  Patient was cleaned and prepped in a sterile fashion of sterile water/ saline was instilled into the bladder when the patient felt the urge to urinate. 9ml of water was then drained from the balloon.  A 18 FR foley cath was removed from the bladder no complications were noted .  Patient as then given some time to void on their own.  Patient can void  49ml on their own after some time.  Patient tolerated well.  Preformed by: Teressa Lower, CMA  Follow up/ Additional notes: with Richardo Hanks next week  Cath placement  Patient was cleaned and prepped in a sterile fashion with betadine and 2% lidocaine jelly was instilled into the urethra. A 16 FR foley cath was replaced into the bladder no complications were noted Urine return was noted and urine was yellow In  color. The balloon was filled with 82ml of sterile water. A leg bag was attached for drainage.Patient was given proper instruction on catheter care.    Preformed by: Teressa Lower, CMA  Follow up: With Dr. Richardo Hanks

## 2019-01-07 ENCOUNTER — Ambulatory Visit (INDEPENDENT_AMBULATORY_CARE_PROVIDER_SITE_OTHER): Payer: Medicare Other | Admitting: Urology

## 2019-01-07 ENCOUNTER — Encounter: Payer: Self-pay | Admitting: Urology

## 2019-01-07 VITALS — BP 116/61 | HR 96 | Ht 69.0 in | Wt 144.0 lb

## 2019-01-07 DIAGNOSIS — R339 Retention of urine, unspecified: Secondary | ICD-10-CM | POA: Diagnosis not present

## 2019-01-07 MED ORDER — NITROFURANTOIN MONOHYD MACRO 100 MG PO CAPS: 100.0000 mg | ORAL_CAPSULE | Freq: Two times a day (BID) | ORAL | 0 refills | Status: DC

## 2019-01-07 MED ORDER — TAMSULOSIN HCL 0.4 MG PO CAPS: 0.4000 mg | ORAL_CAPSULE | Freq: Every day | ORAL | 11 refills | Status: DC

## 2019-01-07 MED ORDER — FLUCONAZOLE 100 MG PO TABS: 100.0000 mg | ORAL_TABLET | Freq: Every day | ORAL | 0 refills | Status: DC

## 2019-01-07 NOTE — Progress Notes (Signed)
   01/07/2019 9:13 AM   Rolan Bucco Nov 27, 1926 [id number]  Reason for visit: Follow up after TURP  HPI: I saw Mr. Chopp in urology clinic today to discuss his recurrent urinary retention after TURP.  Briefly, he is an 85 year old comorbid male with history of COPD, DVT, and stroke with Foley dependent urinary retention for 1 year.  He underwent an uncomplicated bipolar TURP on 12/13/2018.  He did undergo urodynamics preoperatively which showed bladder outlet obstruction with a functional bladder.  He followed up 3 days postop and passed a void trial in clinic.  He also resumed his apixaban at that time, and developed hematuria with possible clot retention 24 hours later.  On 12/18/2018 he underwent bladder irrigation and Foley placement and was sent home from clinic.  He followed up on 12/27/2018, and was only able to void 25 cc of clear yellow urine with a PVR of 250 cc and the catheter was replaced.  He presents today for another voiding trial, Flomax was started 12/27/2018.  He denies any fevers or chills, urine in the Foley has been clear yellow.  He complains today of some oral discomfort, and on exam has findings consistent with thrush.  His Foley was removed, and he was able to void 25 cc.  He does not currently have the urge to void.  He will follow-up this afternoon for repeat PVR.  We will plan to teach him CIC at the same time, as his bladder may need some cycling after 1 year of Foley dependent urinary retention.  I do think he would be able to perform CIC.  I had a long discussion with the patient and his wife and provided reassurance that I think you will be able to ultimately void on his own again.  We discussed that his bladder has been dormant for over a year, and it can take time and/or bladder cycling before he can return to normal voiding.  RTC this afternoon for PVR, teach CIC at that time If unable to void this afternoon, recommend CIC 3 times daily 2 weeks fluconazole for oral  thrush 3 days nitrofurantoin to sterilize the urine in the setting of recent catheter removal  A total of 15 minutes were spent face-to-face with the patient, greater than 50% was spent in patient education, counseling, and coordination of care regarding BPH and outlet obstruction, postoperative expectations after TURP, and treatment of oral thrush.   Sondra Come, MD  [facility] [address], [address] [place], [place] [address] [phone]

## 2019-01-07 NOTE — Patient Instructions (Signed)
Clean Intermittent Catheterization, Male    Clean intermittent catheterization (CIC) is a procedure to remove urine from the bladder by placing a small, flexible tube (catheter) into the bladder though the urethra. The urethra is a tube in the body that carries urine from the bladder out of the body.  CIC may be done when:   You cannot completely empty your bladder on your own. This may be due to a blockage in the bladder or urethra.   Your bladder leaks urine. This may happen when the muscles or nerves near the bladder are not working normally, so the bladder overflows.  Your health care provider will show you how to perform CIC and will help you to feel comfortable performing this procedure at home. Your health care provider will also help you to get the home care supplies that are needed for this procedure.  What supplies will I need?   Germ-free (sterile), water-based lubricant.   A container for urine collection. You may also use the toilet to dispose of urine from the catheter.   A catheter. Your health care provider will determine the best size for you.   Sterile gloves.   Sterile gauze.   Medicated sterile swabs.  How do I perform the procedure?  Most people need CIC at least 4 times per day to adequately empty the bladder. Your health care provider will tell you how often you should perform CIC.  To perform CIC, follow these steps:  1. Wash your hands with soap and water. If soap and water are not available, use hand sanitizer.  2. Prepare the supplies that you will use during the procedure. Open the catheter pack, the lubricant, and the pack of medicated sterile swabs. If you have been told to keep the procedure sterile, do not touch your supplies until you are wearing gloves.  3. Get in a comfortable position. Possible positions include:  ? Sitting on a toilet, a chair, or the edge of a bed.  ? Standing near a toilet.  ? Lying down with your head raised on pillows and your knees pointing to the  ceiling.  4. If you are using a urine collection container, position it between your legs.  5. Urinate, if you are able.  6. Put on gloves.  7. Apply lubricant to about 2 inches (5 cm) of the tip of the catheter.  8. Set the catheter down on a clean, dry surface within reach.  9. Gently stretch your penis out from your body. Pull back any skin that covers the end of your penis (foreskin). Clean the end of your peniswith medicated sterile swabs as told by your health care provider.  10. Hold your penis upward at a 45-60 degree angle. This helps to straighten the urethra.  11. Slowly insert the lubricated catheter 2-3 inches (5-8 cm) straight into your urethra until urine flows freely. Allow urine to drain into the toilet or the urine collection container.  12. When urine starts to flow freely, insert the catheter 1 inch (3 cm) more.  13. When urine stops flowing, slowly remove the catheter.  14. Note the color, amount, and odor of the urine.  15. Clean your penis using soap and water.  16. Wash your hands with soap and water.  17. Follow package instructions about how to clean the catheter after each use.  What should I do at home?  How Often Should I Perform CIC?   Do CIC to empty your bladder every 4-6 hours   or as often as told by your health care provider.   If you have symptoms of too much urine in your bladder (overdistension) and you are not able to urinate, perform CIC. Symptoms of overdistension may include:  ? Restlessness.  ? Sweating or chills.  ? Headache.  ? Flushed or pale skin.  ? Cold limbs.  ? Bloated lower abdomen.  What Are Some Steps That I Can Take to Avoid Problems?   Drink enough fluid to keep your urine clear or pale yellow.   Avoid caffeine. Caffeine may make you urinate more frequently and more urgently.   Dispose of a multiple use catheter when it becomes dry, brittle, or cloudy. This usually happens after you use the catheter for 1 week.   Take over-the-counter and prescription  medicines only as told by your health care provider.   Keep all follow-up visits as told by your health care provider. This is important.  Contact a health care provider if:   You have difficulty performing CIC.   You have urine leaking during CIC.   You have:  ? Dark or cloudy urine.  ? Blood in your urine or in your catheter.  ? A change in the smell of your urine or discharge.  ? A burning feeling while you urinate.   You feel nauseous or you vomit.   You have pain in your abdomen, your back, or your sides below your ribs.   You have swelling or redness around the opening of your urethra.   You develop a rash or sores on your skin.  Get help right away if:   You have a fever.   You have symptoms that do not go away after 3 days.   You have symptoms that suddenly get worse.   You have severe pain.   The amount of urine that drains from your bladder decreases.  This information is not intended to replace advice given to you by your health care provider. Make sure you discuss any questions you have with your health care provider.  Document Released: 11/18/2010 Document Revised: 09/28/2017 Document Reviewed: 04/30/2015  Elsevier Interactive Patient Education  2019 Elsevier Inc.

## 2019-01-07 NOTE — Progress Notes (Signed)
Patient presents back at clinic for PVR after catheter removal this morning. PVR 64ml. Instructed to return to clinic in 1-2 weeks for f/u with Dr Richardo Hanks and PVR and to contact the office if any trouble before then.

## 2019-01-07 NOTE — Progress Notes (Signed)
Fill and Pull Catheter Removal  Patient is present today for a catheter removal.  Patient was cleaned and prepped in a sterile fashion of sterile water/ saline was instilled into the bladder when the patient felt the urge to urinate. 46ml of water was then drained from the balloon.  A 16FR foley cath was removed from the bladder no complications were noted .  Patient as then given some time to void on their own.  Patient can void  67ml on their own after some time.  Patient tolerated well.  Preformed by: Delena Bali, CMA  Follow up/ Additional notes: return to clinic this afternoon for PVR

## 2019-01-14 ENCOUNTER — Encounter: Payer: Self-pay | Admitting: Urology

## 2019-01-14 ENCOUNTER — Ambulatory Visit (INDEPENDENT_AMBULATORY_CARE_PROVIDER_SITE_OTHER): Payer: Medicare Other | Admitting: Urology

## 2019-01-14 ENCOUNTER — Other Ambulatory Visit: Payer: Self-pay

## 2019-01-14 VITALS — BP 101/53 | HR 84 | Ht 69.0 in | Wt 144.0 lb

## 2019-01-14 DIAGNOSIS — R339 Retention of urine, unspecified: Secondary | ICD-10-CM

## 2019-01-14 DIAGNOSIS — N138 Other obstructive and reflux uropathy: Secondary | ICD-10-CM

## 2019-01-14 DIAGNOSIS — N401 Enlarged prostate with lower urinary tract symptoms: Secondary | ICD-10-CM | POA: Diagnosis not present

## 2019-01-14 LAB — BLADDER SCAN AMB NON-IMAGING

## 2019-01-14 NOTE — Progress Notes (Signed)
   01/14/2019 10:28 AM   Rolan Bucco November 24, 1926 [id number]  Reason for visit: Follow up BPH, urinary retention, TURP  HPI: I saw Mr. Wilmott back in urology clinic.  Briefly, he is an 85 year old comorbid male with history of stroke, Foley dependent urinary retention for the last year, history of functional bladder with bladder outlet obstruction on urodynamics, status post uncomplicated bipolar TURP on 12/13/2018.  He had multiple episodes of recurrent retention after surgery, including some hematuria with possible clot retention.  His Foley was removed last week and he was able to void yellow urine without issue.  He is here today for a PVR to confirm adequate emptying.  He reports he is doing well and voiding yellow urine with a strong stream.  He denies any feeling of incomplete emptying, straining, or difficulty urinating.  PVR in clinic today is 230 cc.  He was unable to learn CIC at a prior clinic visit.  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  I discussed with the patient at length options moving forward including catheter replacement, repeat attempt at learning CIC, or ongoing timed and double voiding.  He would like to continue timed and double voiding, he knows to contact our clinic if he has any difficulty urinating, abdominal pain, back pain, or fevers.  RTC 4 months with PVR  Sondra Come, MD  [facility] [address] [place], [place] [address] [phone]

## 2019-01-14 NOTE — Patient Instructions (Signed)
Time voiding, void every 2 hours, call clinic if you are unable to void

## 2019-01-28 ENCOUNTER — Ambulatory Visit: Payer: Medicare Other | Admitting: Urology

## 2019-02-03 ENCOUNTER — Telehealth: Payer: Self-pay | Admitting: Urology

## 2019-02-03 NOTE — Telephone Encounter (Signed)
Returned call to patients wife. She states he is able to control his urine during the day but is incontinent during the night. She states he drinks a couple of bottles of water before bed. They are still doing the time voiding. Advised wife to limit fluid intake at night and have patient void before laying down to go to sleep. If incontinence persists, she will call the office.

## 2019-02-03 NOTE — Telephone Encounter (Signed)
Pt's wife called and states that the pt is unable to control his urine, and would like to know if something could be called in for him. Please advise.

## 2019-02-20 ENCOUNTER — Telehealth: Payer: Self-pay | Admitting: Urology

## 2019-02-20 NOTE — Telephone Encounter (Signed)
Spoke with Alcario Drought and she wanted to clarify with patient \'s follow up instructions from foley removal on 01-14-19. Dr. Keane Scrape plan of timed and double voiding was discussed. She states patient is doing well no worsening urinary symptoms but if they occur will call back to have patient follow up before july

## 2019-02-20 NOTE — Telephone Encounter (Signed)
Allen Potter, home health nurse, wants to clarify that we didn't want pt to have foley.  Please give her a call to confirm.  [phone]

## 2019-05-28 ENCOUNTER — Encounter: Payer: Self-pay | Admitting: Urology

## 2019-05-28 ENCOUNTER — Other Ambulatory Visit: Payer: Self-pay

## 2019-05-28 ENCOUNTER — Ambulatory Visit (INDEPENDENT_AMBULATORY_CARE_PROVIDER_SITE_OTHER): Payer: Medicare Other | Admitting: Urology

## 2019-05-28 VITALS — BP 115/70 | HR 79 | Ht 69.0 in

## 2019-05-28 DIAGNOSIS — R339 Retention of urine, unspecified: Secondary | ICD-10-CM | POA: Diagnosis not present

## 2019-05-28 DIAGNOSIS — N138 Other obstructive and reflux uropathy: Secondary | ICD-10-CM | POA: Diagnosis not present

## 2019-05-28 DIAGNOSIS — N401 Enlarged prostate with lower urinary tract symptoms: Secondary | ICD-10-CM | POA: Diagnosis not present

## 2019-05-28 LAB — BLADDER SCAN AMB NON-IMAGING

## 2019-05-28 NOTE — Progress Notes (Signed)
   05/28/2019 8:54 AM   Allen Potter 03/30/1927 [id number]  Reason for visit: Follow up urinary retention s/p TURP   HPI: I saw [name] back in urology clinic.  Briefly, he is an 85 year old co-morbid male with history of stroke, Foley dependent urinary retention for ~ 1 year, history of functional bladder with bladder outlet obstruction on urodynamics, status post uncomplicated bipolar TURP on 12/13/2018.  He had multiple episodes of recurrent retention after surgery, including some hematuria with possible clot retention.  His Foley was removed on 01/07/19 and he was able to void yellow urine without issue. His last follow up was 01/14/19 and he was voiding yellow urine with a strong stream, with mildly elevated PVR of 230cc.  He reports he is doing well and voiding yellow urine with a strong stream.  He denies any feeling of incomplete emptying, straining, or difficulty urinating.  He denies dysuria or gross hematuria. He denies any incontinence. He has nocturia 1-2x per night. PVR in clinic today is 230cc again.  He was unable to learn CIC at a prior clinic visit.  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  I again discussed with the patient at length options moving forward including catheter replacement, repeat attempt at learning CIC, or ongoing timed and double voiding.  He would like to continue timed and double voiding.  With his age and co-morbidities I think this is very reasonable.  RTC 1 year with PVR Contact us sooner if any difficulty with urination, UTIs, or gross hematuria  A total of 10 minutes were spent face-to-face with the patient, greater than 50% was spent in patient education, counseling, and coordination of care regarding BPH, history of urinary retention, and postop expectations.   Billey Co, [facility] [address] [place], [place] [address] [phone]

## 2019-06-01 IMAGING — DX DG CHEST 1V PORT
1 series · 1 of 1 positions shown · non-contrast
Comparison: Portable exam 9508 hours compared 06/12/2018

CLINICAL DATA: Hypotension, dizziness with standing, history COPD

EXAM:
PORTABLE CHEST 1 VIEW

[chest ap]
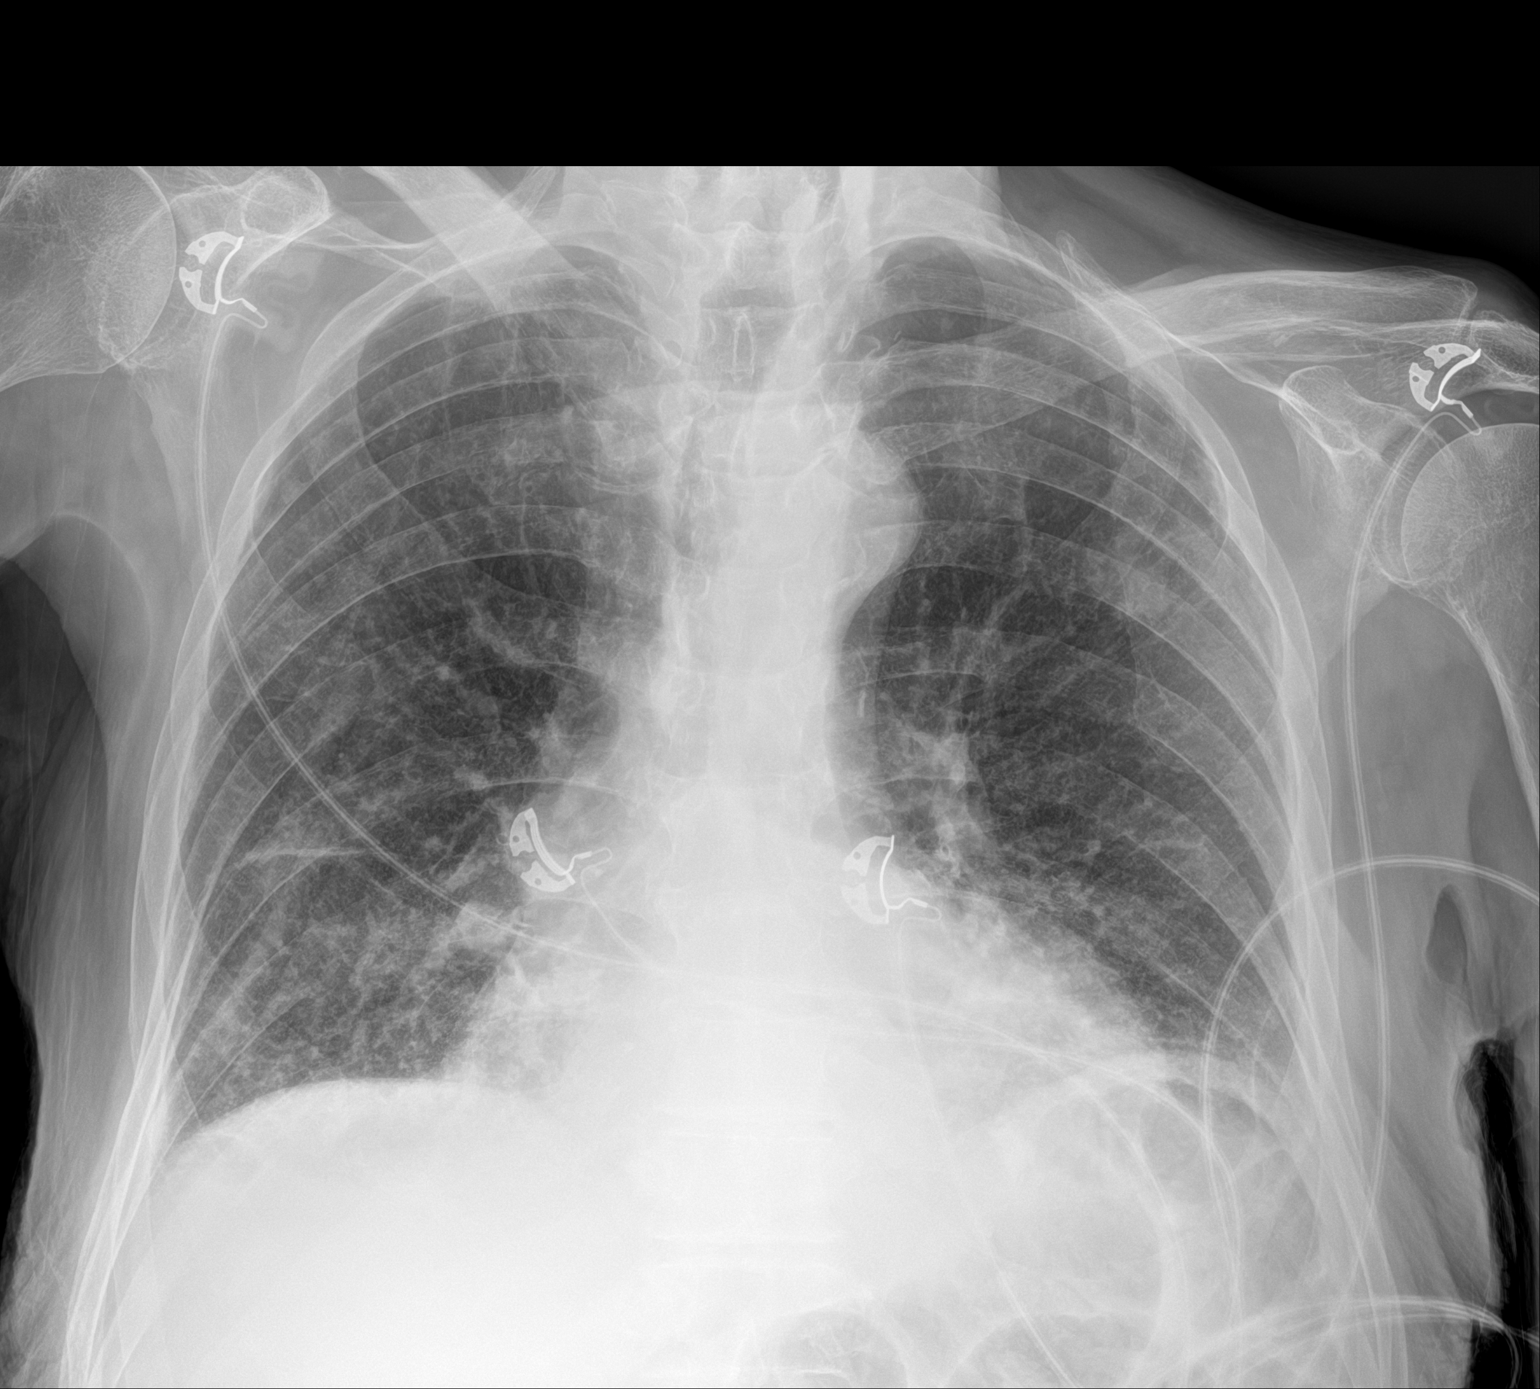

[1 of 1 positions shown; findings below may reference images not displayed]

FINDINGS: Normal heart size, mediastinal contours, and pulmonary vascularity.

Atherosclerotic calcification aorta.

Bibasilar atelectasis.

Question nodular density LEFT upper lobe 13 x 10 mm.

No pleural effusion or pneumothorax.

Bones demineralized.
IMPRESSION: Bibasilar atelectasis.

Question 13 x 10 mm LEFT upper lobe pulmonary nodule; CT chest
recommended to exclude pulmonary nodule.

## 2019-11-12 ENCOUNTER — Emergency Department: Payer: Medicare Other

## 2019-11-12 ENCOUNTER — Other Ambulatory Visit: Payer: Self-pay

## 2019-11-12 ENCOUNTER — Encounter: Payer: Self-pay | Admitting: Emergency Medicine

## 2019-11-12 ENCOUNTER — Inpatient Hospital Stay
Admission: EM | Admit: 2019-11-12 | Discharge: 2019-11-14 | DRG: 683 | Disposition: A | Discharge status: Skilled Nursing Facility | Payer: Medicare Other | Attending: Family Medicine | Admitting: Family Medicine

## 2019-11-12 DIAGNOSIS — A419 Sepsis, unspecified organism: Secondary | ICD-10-CM | POA: Diagnosis not present

## 2019-11-12 DIAGNOSIS — I5022 Chronic systolic (congestive) heart failure: Secondary | ICD-10-CM | POA: Diagnosis present

## 2019-11-12 DIAGNOSIS — Z9181 History of falling: Secondary | ICD-10-CM

## 2019-11-12 DIAGNOSIS — Z681 Body mass index (BMI) 19 or less, adult: Secondary | ICD-10-CM

## 2019-11-12 DIAGNOSIS — N3000 Acute cystitis without hematuria: Secondary | ICD-10-CM

## 2019-11-12 DIAGNOSIS — R531 Weakness: Secondary | ICD-10-CM | POA: Diagnosis not present

## 2019-11-12 DIAGNOSIS — I429 Cardiomyopathy, unspecified: Secondary | ICD-10-CM | POA: Diagnosis present

## 2019-11-12 DIAGNOSIS — Z20822 Contact with and (suspected) exposure to covid-19: Secondary | ICD-10-CM | POA: Diagnosis present

## 2019-11-12 DIAGNOSIS — J449 Chronic obstructive pulmonary disease, unspecified: Secondary | ICD-10-CM | POA: Diagnosis not present

## 2019-11-12 DIAGNOSIS — R8281 Pyuria: Secondary | ICD-10-CM | POA: Diagnosis present

## 2019-11-12 DIAGNOSIS — W19XXXA Unspecified fall, initial encounter: Secondary | ICD-10-CM | POA: Diagnosis present

## 2019-11-12 DIAGNOSIS — N179 Acute kidney failure, unspecified: Principal | ICD-10-CM | POA: Diagnosis present

## 2019-11-12 DIAGNOSIS — E440 Moderate protein-calorie malnutrition: Secondary | ICD-10-CM | POA: Diagnosis present

## 2019-11-12 DIAGNOSIS — F0390 Unspecified dementia without behavioral disturbance: Secondary | ICD-10-CM | POA: Diagnosis present

## 2019-11-12 DIAGNOSIS — E860 Dehydration: Secondary | ICD-10-CM | POA: Diagnosis present

## 2019-11-12 DIAGNOSIS — N1831 Chronic kidney disease, stage 3a: Secondary | ICD-10-CM | POA: Diagnosis present

## 2019-11-12 DIAGNOSIS — R0902 Hypoxemia: Secondary | ICD-10-CM | POA: Diagnosis present

## 2019-11-12 DIAGNOSIS — Z7951 Long term (current) use of inhaled steroids: Secondary | ICD-10-CM

## 2019-11-12 DIAGNOSIS — Z87440 Personal history of urinary (tract) infections: Secondary | ICD-10-CM

## 2019-11-12 DIAGNOSIS — Z79899 Other long term (current) drug therapy: Secondary | ICD-10-CM

## 2019-11-12 DIAGNOSIS — Z87891 Personal history of nicotine dependence: Secondary | ICD-10-CM

## 2019-11-12 DIAGNOSIS — Z8673 Personal history of transient ischemic attack (TIA), and cerebral infarction without residual deficits: Secondary | ICD-10-CM

## 2019-11-12 DIAGNOSIS — Z7901 Long term (current) use of anticoagulants: Secondary | ICD-10-CM

## 2019-11-12 DIAGNOSIS — D509 Iron deficiency anemia, unspecified: Secondary | ICD-10-CM | POA: Diagnosis present

## 2019-11-12 DIAGNOSIS — Z86718 Personal history of other venous thrombosis and embolism: Secondary | ICD-10-CM

## 2019-11-12 DIAGNOSIS — N390 Urinary tract infection, site not specified: Secondary | ICD-10-CM | POA: Diagnosis present

## 2019-11-12 DIAGNOSIS — E782 Mixed hyperlipidemia: Secondary | ICD-10-CM | POA: Diagnosis present

## 2019-11-12 HISTORY — DX: Urinary tract infection, site not specified: N39.0

## 2019-11-12 LAB — RESPIRATORY PANEL BY RT PCR (FLU A&B, COVID)
Influenza A by PCR: NEGATIVE
Influenza B by PCR: NEGATIVE
SARS Coronavirus 2 by RT PCR: NEGATIVE

## 2019-11-12 LAB — COMPREHENSIVE METABOLIC PANEL
ALT: 13 U/L (ref 0–44)
AST: 17 U/L (ref 15–41)
Albumin: 2.9 g/dL — ABNORMAL LOW (ref 3.5–5.0)
Alkaline Phosphatase: 55 U/L (ref 38–126)
Anion gap: 10 (ref 5–15)
BUN: 59 mg/dL — ABNORMAL HIGH (ref 8–23)
CO2: 25 mmol/L (ref 22–32)
Calcium: 8.9 mg/dL (ref 8.9–10.3)
Chloride: 104 mmol/L (ref 98–111)
Creatinine, Ser: 1.75 mg/dL — ABNORMAL HIGH (ref 0.61–1.24)
GFR calc Af Amer: 39 mL/min — ABNORMAL LOW (ref >60)
GFR calc non Af Amer: 34 mL/min — ABNORMAL LOW (ref >60)
Glucose, Bld: 106 mg/dL — ABNORMAL HIGH (ref 70–99)
Potassium: 4.2 mmol/L (ref 3.5–5.1)
Sodium: 139 mmol/L (ref 135–145)
Total Bilirubin: 0.7 mg/dL (ref 0.3–1.2)
Total Protein: 8.6 g/dL — ABNORMAL HIGH (ref 6.5–8.1)

## 2019-11-12 LAB — URINALYSIS, COMPLETE (UACMP) WITH MICROSCOPIC
Bacteria, UA: NONE SEEN
Bilirubin Urine: NEGATIVE
Glucose, UA: NEGATIVE mg/dL
Ketones, ur: NEGATIVE mg/dL
Nitrite: NEGATIVE
Protein, ur: 100 mg/dL — AB
Specific Gravity, Urine: 1.02 (ref 1.005–1.030)
pH: 5 (ref 5.0–8.0)

## 2019-11-12 LAB — CBC WITH DIFFERENTIAL/PLATELET
Abs Immature Granulocytes: 0.06 10*3/uL (ref 0.00–0.07)
Basophils Absolute: 0.1 10*3/uL (ref 0.0–0.1)
Basophils Relative: 1 %
Eosinophils Absolute: 0.3 10*3/uL (ref 0.0–0.5)
Eosinophils Relative: 2 %
HCT: 33.8 % — ABNORMAL LOW (ref 39.0–52.0)
Hemoglobin: 10.1 g/dL — ABNORMAL LOW (ref 13.0–17.0)
Immature Granulocytes: 0 %
Lymphocytes Relative: 10 %
Lymphs Abs: 1.5 10*3/uL (ref 0.7–4.0)
MCH: 25.4 pg — ABNORMAL LOW (ref 26.0–34.0)
MCHC: 29.9 g/dL — ABNORMAL LOW (ref 30.0–36.0)
MCV: 85.1 fL (ref 80.0–100.0)
Monocytes Absolute: 1 10*3/uL (ref 0.1–1.0)
Monocytes Relative: 7 %
Neutro Abs: 12.1 10*3/uL — ABNORMAL HIGH (ref 1.7–7.7)
Neutrophils Relative %: 80 %
Platelets: 306 10*3/uL (ref 150–400)
RBC: 3.97 MIL/uL — ABNORMAL LOW (ref 4.22–5.81)
RDW: 15.9 % — ABNORMAL HIGH (ref 11.5–15.5)
WBC: 14.9 10*3/uL — ABNORMAL HIGH (ref 4.0–10.5)
nRBC: 0 % (ref 0.0–0.2)

## 2019-11-12 LAB — BLOOD GAS, VENOUS
Acid-Base Excess: 3.5 mmol/L — ABNORMAL HIGH (ref 0.0–2.0)
Bicarbonate: 29.1 mmol/L — ABNORMAL HIGH (ref 20.0–28.0)
FIO2: 0.21
O2 Saturation: 61.6 %
Patient temperature: 37
pCO2, Ven: 47 mmHg (ref 44.0–60.0)
pH, Ven: 7.4 (ref 7.250–7.430)
pO2, Ven: 32 mmHg (ref 32.0–45.0)

## 2019-11-12 LAB — TROPONIN I (HIGH SENSITIVITY)
Troponin I (High Sensitivity): 16 ng/L (ref <18)
Troponin I (High Sensitivity): 15 ng/L (ref <18)

## 2019-11-12 LAB — PROTIME-INR
INR: 1.6 — ABNORMAL HIGH (ref 0.8–1.2)
Prothrombin Time: 19.3 seconds — ABNORMAL HIGH (ref 11.4–15.2)

## 2019-11-12 LAB — LACTIC ACID, PLASMA
Lactic Acid, Venous: 2 mmol/L (ref 0.5–1.9)
Lactic Acid, Venous: 1.2 mmol/L (ref 0.5–1.9)

## 2019-11-12 LAB — BRAIN NATRIURETIC PEPTIDE: B Natriuretic Peptide: 132 pg/mL — ABNORMAL HIGH (ref 0.0–100.0)

## 2019-11-12 LAB — PROCALCITONIN: Procalcitonin: <0.1 ng/mL

## 2019-11-12 MED ORDER — ONDANSETRON HCL 4 MG/2ML IJ SOLN: 4.0000 mg | Freq: Three times a day (TID) | INTRAMUSCULAR | Status: DC | PRN

## 2019-11-12 MED ORDER — CARVEDILOL 3.125 MG PO TABS
3.1250 mg | ORAL_TABLET | Freq: Two times a day (BID) | ORAL | Status: DC
  Administered 2019-11-12 – 2019-11-14 (×5): 3.125 mg via ORAL
  Filled 2019-11-12 (×5): qty 1

## 2019-11-12 MED ORDER — ENSURE ENLIVE PO LIQD
237.0000 mL | Freq: Two times a day (BID) | ORAL | Status: DC
  Administered 2019-11-13 – 2019-11-14 (×4): 237 mL via ORAL

## 2019-11-12 MED ORDER — FLUTICASONE FUROATE-VILANTEROL 100-25 MCG/INH IN AEPB
1.0000 | INHALATION_SPRAY | Freq: Every day | RESPIRATORY_TRACT | Status: DC
  Administered 2019-11-12 – 2019-11-14 (×3): 1 via RESPIRATORY_TRACT
  Filled 2019-11-12 (×2): qty 28

## 2019-11-12 MED ORDER — DM-GUAIFENESIN ER 30-600 MG PO TB12
1.0000 | ORAL_TABLET | Freq: Two times a day (BID) | ORAL | Status: DC
  Administered 2019-11-12 – 2019-11-14 (×5): 1 via ORAL
  Filled 2019-11-12 (×10): qty 1

## 2019-11-12 MED ORDER — IPRATROPIUM BROMIDE 0.02 % IN SOLN
2.5000 mL | RESPIRATORY_TRACT | Status: DC
  Administered 2019-11-12 – 2019-11-14 (×11): 0.5 mg via RESPIRATORY_TRACT
  Filled 2019-11-12 (×11): qty 2.5

## 2019-11-12 MED ORDER — SODIUM CHLORIDE 0.9 % IV BOLUS
500.0000 mL | Freq: Once | INTRAVENOUS | Status: AC
  Administered 2019-11-12: 12:00:00 500 mL via INTRAVENOUS

## 2019-11-12 MED ORDER — RISAQUAD PO CAPS
1.0000 | ORAL_CAPSULE | Freq: Every day | ORAL | Status: DC
  Administered 2019-11-12 – 2019-11-14 (×3): 1 via ORAL
  Filled 2019-11-12 (×4): qty 1

## 2019-11-12 MED ORDER — SODIUM CHLORIDE 0.9 % IV BOLUS
500.0000 mL | Freq: Once | INTRAVENOUS | Status: AC
  Administered 2019-11-12: 500 mL via INTRAVENOUS

## 2019-11-12 MED ORDER — APIXABAN 2.5 MG PO TABS
2.5000 mg | ORAL_TABLET | Freq: Two times a day (BID) | ORAL | Status: DC
  Administered 2019-11-12 – 2019-11-14 (×5): 2.5 mg via ORAL
  Filled 2019-11-12 (×6): qty 1

## 2019-11-12 MED ORDER — SIMVASTATIN 20 MG PO TABS
40.0000 mg | ORAL_TABLET | Freq: Every day | ORAL | Status: DC
  Administered 2019-11-12 – 2019-11-14 (×3): 40 mg via ORAL
  Filled 2019-11-12: qty 2
  Filled 2019-11-12: qty 4
  Filled 2019-11-12: qty 2

## 2019-11-12 MED ORDER — VANCOMYCIN VARIABLE DOSE PER UNSTABLE RENAL FUNCTION (PHARMACIST DOSING): Status: DC

## 2019-11-12 MED ORDER — SODIUM CHLORIDE 0.9 % IV SOLN
1.0000 g | INTRAVENOUS | Status: DC
  Administered 2019-11-13 – 2019-11-14 (×2): 1 g via INTRAVENOUS
  Filled 2019-11-12 (×2): qty 1
  Filled 2019-11-12: qty 10

## 2019-11-12 MED ORDER — ALBUTEROL SULFATE (2.5 MG/3ML) 0.083% IN NEBU: 3.0000 mL | INHALATION_SOLUTION | RESPIRATORY_TRACT | Status: DC | PRN

## 2019-11-12 MED ORDER — SODIUM CHLORIDE 0.9 % IV SOLN: INTRAVENOUS | Status: DC

## 2019-11-12 MED ORDER — POLYSACCHARIDE IRON COMPLEX 150 MG PO CAPS
150.0000 mg | ORAL_CAPSULE | Freq: Every day | ORAL | Status: DC
  Administered 2019-11-13 – 2019-11-14 (×2): 150 mg via ORAL
  Filled 2019-11-12 (×4): qty 1

## 2019-11-12 MED ORDER — ACETAMINOPHEN 650 MG RE SUPP: 650.0000 mg | Freq: Four times a day (QID) | RECTAL | Status: DC | PRN

## 2019-11-12 MED ORDER — SODIUM CHLORIDE 0.9 % IV SOLN
1.0000 g | Freq: Once | INTRAVENOUS | Status: AC
  Administered 2019-11-12: 1 g via INTRAVENOUS
  Filled 2019-11-12: qty 10

## 2019-11-12 MED ORDER — ACETAMINOPHEN 325 MG PO TABS: 650.0000 mg | ORAL_TABLET | Freq: Four times a day (QID) | ORAL | Status: DC | PRN

## 2019-11-12 MED ORDER — HYDRALAZINE HCL 25 MG PO TABS: 25.0000 mg | ORAL_TABLET | Freq: Three times a day (TID) | ORAL | Status: DC | PRN

## 2019-11-12 MED ORDER — VITAMIN B-12 1000 MCG PO TABS
1000.0000 ug | ORAL_TABLET | Freq: Every day | ORAL | Status: DC
  Administered 2019-11-13 – 2019-11-14 (×2): 1000 ug via ORAL
  Filled 2019-11-12 (×3): qty 1

## 2019-11-12 MED ORDER — VANCOMYCIN HCL 1500 MG/300ML IV SOLN
1500.0000 mg | Freq: Once | INTRAVENOUS | Status: AC
  Administered 2019-11-12: 1500 mg via INTRAVENOUS
  Filled 2019-11-12: qty 300

## 2019-11-12 NOTE — ED Notes (Signed)
Pt's denture cup and watch placed in labeled biohazard bag and placed on counter beside sink with pants

## 2019-11-12 NOTE — ED Notes (Signed)
Pt's wife [name] updated on pt's status and plan

## 2019-11-12 NOTE — H&P (Signed)
History and Physical    Allen Potter AYT:[id number] DOB: May 28, 1927 DOA: 11/12/2019  Referring MD/NP/PA:   PCP: Tracie Harrier, MD   Patient coming from:  The patient is coming from home.  At baseline, pt is independent for most of ADL.        Chief Complaint: Generalized weakness, fall  HPI: Allen Potter is a 86 y.o. male with medical history significant of hyperlipidemia, COPD, stroke, RLS, DVT on Eliquis, iron deficiency anemia, sCHF with EF of 40%, CKD stage IIIa, who presents with generalized weakness and fall.  Patient states that he has been having generalized weakness in the past several days, which has been gradually worsening.  No unilateral numbness or tinglings in extremities, no facial droop or slurred speech. Yesterday he sustained a fall due to weakness, but no significant injury.  No headache or neck pain.  Patient denies any chest pain, cough, fever or chills.  He denies shortness breath, but his oxygen saturation is 80s at arrival to ED. Pt was put on 2L nasal cannula oxygen.  Oxygen saturation improved later to 100% on room air, and then patient is off oxygen.  Patient does not have nausea, vomiting, diarrhea or abdominal pain.  He was recently treated for UTI and completed a course of amoxicillin.  Of note, patient had positive urine culture for MRSA on 12/03/2018  ED Course: pt was found to have WBC 14.9, positive urinalysis (cloudy appearance, trace amount of leukocyte, WBC 11-20), lactic acid 2.0, 1.2, INR 1.6, troponin 16, 15, BUN 132, worsening renal function, temperature 99.1, blood pressure 117/72, heart rate 72, chest x-ray negative for infiltration.  CT head is negative for acute intracranial abnormalities the patient is placed on MedSurg bed for observation.  Review of Systems:   General: no fevers, chills, no body weight gain, has poor appetite, has fatigue and weakness HEENT: no blurry vision, hearing changes or sore throat Respiratory: no dyspnea, coughing,  wheezing CV: no chest pain, no palpitations GI: no nausea, vomiting, abdominal pain, diarrhea, constipation GU: no dysuria, burning on urination, increased urinary frequency, hematuria  Ext: no leg edema Neuro: no unilateral weakness, numbness, or tingling, no vision change or hearing loss. Has fall. Skin: no rash, no skin tear. MSK: No muscle spasm, no deformity, no limitation of range of movement in spin Heme: No easy bruising.  Travel history: No recent long distant travel.  Allergy: No Known Allergies  Past Medical History:  Diagnosis Date  . Anemia    iron deficiency  . COPD (chronic obstructive pulmonary disease) ([place])   . DVT (deep venous thrombosis) ([place]) 01/2018  . Hyperlipidemia   . Restless leg   . Stroke ([place]) 03/2018  . UTI (urinary tract infection)     Past Surgical History:  Procedure Laterality Date  . CATARACT EXTRACTION Bilateral 2015  . CYSTOSCOPY WITH BIOPSY N/A 12/13/2018   Procedure: CYSTOSCOPY WITH BLADDER BIOPSY;  Surgeon: Billey Co, MD;  Location: [facility] ORS;  Service: Urology;  Laterality: N/A;  . TRANSURETHRAL RESECTION OF PROSTATE N/A 12/13/2018   Procedure: TRANSURETHRAL RESECTION OF THE PROSTATE (TURP);  Surgeon: Billey Co, MD;  Location: [facility] ORS;  Service: Urology;  Laterality: N/A;    Social History:  reports that he quit smoking about 21 years ago. His smoking use included cigarettes. He smoked 0.25 packs per day. He has never used smokeless tobacco. He reports previous alcohol use. He reports that he does not use drugs.  Family History: No family history on  file.   Prior to Admission medications   Medication Sig Start Date End Date Taking? Authorizing Provider  acetaminophen (TYLENOL) 325 MG tablet Take 650 mg by mouth every 4 (four) hours as needed for mild pain or fever.   Yes [provider]  acidophilus (RISAQUAD) CAPS capsule Take 1 capsule by mouth daily.   Yes [provider]  albuterol (VENTOLIN HFA) 108  (90 Base) MCG/ACT inhaler Inhale 2 puffs into the lungs every 6 (six) hours as needed for wheezing or shortness of breath.  10/02/17  Yes [provider]  apixaban (ELIQUIS) 2.5 MG TABS tablet Take 2.5 mg by mouth 2 (two) times daily.  12/16/18  Yes [provider]  carvedilol (COREG) 3.125 MG tablet Take 3.125 mg by mouth 2 (two) times daily.  11/18/18  Yes [provider]  fluticasone furoate-vilanterol (BREO ELLIPTA) 100-25 MCG/INH AEPB Inhale 1 puff into the lungs daily. 10/02/17  Yes [provider]  iron polysaccharides (NIFEREX) 150 MG capsule Take 150 mg by mouth daily.   Yes [provider]  lisinopril (ZESTRIL) 2.5 MG tablet Take 2.5 mg by mouth daily.   Yes [provider]  tiotropium (SPIRIVA HANDIHALER) 18 MCG inhalation capsule Place 1 puff into inhaler and inhale daily. 10/02/17  Yes [provider]  vitamin B-12 (CYANOCOBALAMIN) 1000 MCG tablet Take 1,000 mcg by mouth daily.   Yes [provider]  simvastatin (ZOCOR) 40 MG tablet Take 1 tablet (40 mg total) by mouth daily at 6 PM. Patient not taking: Reported on 11/12/2019 02/28/18   Altamese Dilling, MD    Physical Exam: Vitals:   11/12/19 1430 11/12/19 1530 11/12/19 1600 11/12/19 1630  BP: (!) 152/77 (!) 145/83 (!) 155/77 (!) 161/80  Pulse:  70 69 68  Resp: (!) 21 20 (!) 21 (!) 22  Temp:      TempSrc:      SpO2:  100% 100% 100%   General: Not in acute distress.  Thin body habitus HEENT:       Eyes: PERRL, EOMI, no scleral icterus.       ENT: No discharge from the ears and nose, no pharynx injection, no tonsillar enlargement.        Neck: No JVD, no bruit, no mass felt. Heme: No neck lymph node enlargement. Cardiac: S1/S2, RRR, No murmurs, No gallops or rubs. Respiratory: No rales, wheezing, rhonchi or rubs. GI: Soft, nondistended, nontender, no rebound pain, no organomegaly, BS present. GU: No hematuria Ext: No pitting leg edema bilaterally.  2+DP/PT pulse bilaterally. Musculoskeletal: No joint deformities, No joint redness or warmth, no limitation of ROM in spin. Skin: No rashes.  Neuro: Alert, oriented X3, cranial nerves II-XII grossly intact, moves all extremities. Psych: Patient is not psychotic, no suicidal or hemocidal ideation.  Labs on Admission: I have personally reviewed following labs and imaging studies  CBC: Recent Labs  Lab 11/12/19 1105  WBC 14.9*  NEUTROABS 12.1*  HGB 10.1*  HCT 33.8*  MCV 85.1  PLT 306   Basic Metabolic Panel: Recent Labs  Lab 11/12/19 1105  NA 139  K 4.2  CL 104  CO2 25  GLUCOSE 106*  BUN 59*  CREATININE 1.75*  CALCIUM 8.9   GFR: CrCl cannot be calculated (Unknown ideal weight.). Liver Function Tests: Recent Labs  Lab 11/12/19 1105  AST 17  ALT 13  ALKPHOS 55  BILITOT 0.7  PROT 8.6*  ALBUMIN 2.9*   No results for input(s): LIPASE, AMYLASE in the last 168  hours. No results for input(s): AMMONIA in the last 168 hours. Coagulation Profile: Recent Labs  Lab 11/12/19 1105  INR 1.6*   Cardiac Enzymes: No results for input(s): CKTOTAL, CKMB, CKMBINDEX, TROPONINI in the last 168 hours. BNP (last 3 results) No results for input(s): PROBNP in the last 8760 hours. HbA1C: No results for input(s): HGBA1C in the last 72 hours. CBG: No results for input(s): GLUCAP in the last 168 hours. Lipid Profile: No results for input(s): CHOL, HDL, LDLCALC, TRIG, CHOLHDL, LDLDIRECT in the last 72 hours. Thyroid Function Tests: No results for input(s): TSH, T4TOTAL, FREET4, T3FREE, THYROIDAB in the last 72 hours. Anemia Panel: No results for input(s): VITAMINB12, FOLATE, FERRITIN, TIBC, IRON, RETICCTPCT in the last 72 hours. Urine analysis:    Component Value Date/Time   COLORURINE YELLOW (A) 11/12/2019 1305   APPEARANCEUR CLOUDY (A) 11/12/2019 1305   APPEARANCEUR Cloudy (A) 12/03/2018 1113   LABSPEC 1.020 11/12/2019 1305   PHURINE 5.0 11/12/2019 1305   GLUCOSEU NEGATIVE  11/12/2019 1305   HGBUR LARGE (A) 11/12/2019 1305   BILIRUBINUR NEGATIVE 11/12/2019 1305   BILIRUBINUR Negative 12/03/2018 1113   KETONESUR NEGATIVE 11/12/2019 1305   PROTEINUR 100 (A) 11/12/2019 1305   NITRITE NEGATIVE 11/12/2019 1305   LEUKOCYTESUR TRACE (A) 11/12/2019 1305   Sepsis Labs: @LABRCNTIP (procalcitonin:4,lacticidven:4) ) Recent Results (from the past 240 hour(s))  Respiratory Panel by RT PCR (Flu A&B, Covid) - Nasopharyngeal Swab     Status: None   Collection Time: 11/12/19 11:35 AM   Specimen: Nasopharyngeal Swab  Result Value Ref Range Status   SARS Coronavirus 2 by RT PCR NEGATIVE NEGATIVE Final    Comment: (NOTE) SARS-CoV-2 target nucleic acids are NOT DETECTED. The SARS-CoV-2 RNA is generally detectable in upper respiratoy specimens during the acute phase of infection. The lowest concentration of SARS-CoV-2 viral copies this assay can detect is 131 copies/mL. A negative result does not preclude SARS-Cov-2 infection and should not be used as the sole basis for treatment or other patient management decisions. A negative result may occur with  improper specimen collection/handling, submission of specimen other than nasopharyngeal swab, presence of viral mutation(s) within the areas targeted by this assay, and inadequate number of viral copies (<131 copies/mL). A negative result must be combined with clinical observations, patient history, and epidemiological information. The expected result is Negative. Fact Sheet for Patients:  11/14/19 Fact Sheet for Healthcare Providers:  https://www.moore.com/ This test is not yet ap proved or cleared by the https://www.young.biz/ FDA and  has been authorized for detection and/or diagnosis of SARS-CoV-2 by FDA under an Emergency Use Authorization (EUA). This EUA will remain  in effect (meaning this test can be used) for the duration of the COVID-19 declaration under Section  564(b)(1) of the Act, 21 U.S.C. section 360bbb-3(b)(1), unless the authorization is terminated or revoked sooner.    Influenza A by PCR NEGATIVE NEGATIVE Final   Influenza B by PCR NEGATIVE NEGATIVE Final    Comment: (NOTE) The Xpert Xpress SARS-CoV-2/FLU/RSV assay is intended as an aid in  the diagnosis of influenza from Nasopharyngeal swab specimens and  should not be used as a sole basis for treatment. Nasal washings and  aspirates are unacceptable for Xpert Xpress SARS-CoV-2/FLU/RSV  testing. Fact Sheet for Patients: [place] Fact Sheet for Healthcare Providers: https://www.moore.com/ This test is not yet approved or cleared by the https://www.young.biz/ FDA and  has been authorized for detection and/or diagnosis of SARS-CoV-2 by  FDA under an Emergency Use Authorization (EUA). This EUA  will remain  in effect (meaning this test can be used) for the duration of the  Covid-19 declaration under Section 564(b)(1) of the Act, 21  U.S.C. section 360bbb-3(b)(1), unless the authorization is  terminated or revoked. Performed at [facility], [address], [place], [place] [address]      Radiological Exams on Admission: CT Head Wo Contrast  Result Date: 11/12/2019 CLINICAL DATA:  Recent UTI. Weakness, urinary retention and falls. EXAM: CT HEAD WITHOUT CONTRAST TECHNIQUE: Contiguous axial images were obtained from the base of the skull through the vertex without intravenous contrast. COMPARISON:  Brain MRI 05/21/2018 FINDINGS: Brain: No evidence of acute infarction, hemorrhage, hydrocephalus, extra-axial collection or mass lesion/mass effect. There is mild diffuse low-attenuation within the subcortical and periventricular white matter compatible with chronic microvascular disease. Prominence of the CSF spaces noted compatible with brain atrophy. Vascular: No hyperdense vessel or unexpected calcification. Skull: Normal. Negative for  fracture or focal lesion. Sinuses/Orbits: Asymmetric partial opacification of the right mastoid air cells. The remaining mastoid air cells and paranasal sinuses appear clear. Other: None. IMPRESSION: 1. No acute intracranial abnormalities. 2. Chronic small vessel ischemic change and brain atrophy. 3. Asymmetric partial opacification of the right mastoid air cells. Electronically Signed   By: Signa Kell M.D.   On: 11/12/2019 11:38   DG Chest Port 1 View  Result Date: 11/12/2019 CLINICAL DATA:  Hypoxia per ordering notes. Patient is from home with recent uti and now weakness, urinary retention and falls. Hx of COPD. Former smoker. EXAM: PORTABLE CHEST 1 VIEW COMPARISON:  Chest radiograph, 06/26/2018 and prior exams. Prior chest CT, 06/26/2018. FINDINGS: Cardiac silhouette is normal in size. Small to moderate hiatal hernia. Mediastinal or hilar masses. Bilateral irregular interstitial thickening with a lower lung zone predominance, similar to the prior exam. There is a subcentimeter nodular opacity at the right lateral lung base, not evident on the prior study. The left upper lobe nodule noted on the prior chest radiograph and CT is not defined radiographically. There is linear and reticular type opacity in the left upper lobe most suggestive of scarring, which appears increased from prior radiograph. No convincing pneumonia or pulmonary edema. Pleural effusion or pneumothorax. Skeletal structures are grossly intact. IMPRESSION: 1. No acute cardiopulmonary disease. 2. Subcentimeter nodule suggested in the right lateral lung base. Recommend follow-up chest CT. Patient had a suspicious nodule in the left upper lobe on the prior chest CT. It is unclear whether this underwent further diagnostic testing or therapy. It is not visualized on the current exam. 3. Findings consistent with interstitial lung disease similar to the prior radiograph and CT. Electronically Signed   By: Amie Portland M.D.   On: 11/12/2019  11:21     EKG: Independently reviewed.  Sinus rhythm, QTC 478, LAE, RAD, left bundle blockade (seems to be old), ST depression in inferior leads and V6).   Assessment/Plan Principal Problem:   UTI (urinary tract infection) Active Problems:   COPD, moderate (HCC)   History of DVT of lower extremity   Hyperlipidemia, mixed   Fall   Acute renal failure superimposed on stage 3a chronic kidney disease (HCC)   Iron deficiency anemia   Protein-calorie malnutrition, moderate (HCC)   Chronic systolic CHF (congestive heart failure) (HCC)   UTI (urinary tract infection): He was recently treated for UTI and completed a course of amoxicillin.  Of note, patient had positive urine culture for MRSA on 12/03/2018. It is possible that his UTI was was not treated appropriately.  -  Placed on MedSurg bed of observation -IV Rocephin and vancomycin -Follow-up blood culture and urine culture  Fall and generalized weakness: Likely multifactorial etiology, including UTI, worsening renal function and protein calorie malnutrition. No focal neurologic findings on physical examination.  CT head is negative for acute intracranial abnormalities. -PT/OT -fall precaution  History of DVT of lower extremity: -on Eliquis  COPD, moderate (HCC): Patient had oxygen desaturation initially, which has resolved.  Currently patient is off oxygen, saturation and 94% on room air. -Continue bronchodilators, as needed Mucinex for cough  Hyperlipidemia, mixed: -zocor  Acute renal failure superimposed on stage 3a chronic kidney disease (HCC): Baseline Cre is 1.1-1.3, pt's Cre is 1.75 and BUN 59 on admission. Likely due to prerenal secondary to dehydration and continuation of ACEI - IVF: 500 cc of NS x 2, then 75 cc/h - Follow up renal function by BMP - Avoid using renal toxic medications, hypotension and contrast dye (or carefully use) - Hold lisinopril  Iron deficiency anemia: Hemoglobin stable, 10.1 (9.1 on  12/14/2018) -Continue iron supplement  Protein-calorie malnutrition, moderate (HCC) -start Ensure  Chronic systolic CHF: 2D echo on 02/27/2018 showed EF of 40%.  Patient does not have leg edema.  BNP 132.  No pulmonary edema chest x-ray.  CHF is compensated. -Watch volume status closely   DVT ppx: on eliquis Code Status: Full code (I discussed with patient and explained the meaning of CODE STATUS. Patient wants to be full code) Family Communication: None at bed side.    Disposition Plan:  Anticipate discharge back to previous home environment Consults called:  none Admission status: Med-surg bed for obs  Date of Service 11/12/2019    Lorretta Harp [facility]   If 7PM-7AM, please contact night-coverage [email or web address] Password [facility] 11/12/2019, 4:40 PM

## 2019-11-12 NOTE — Progress Notes (Signed)
Pharmacy Antibiotic Note  Allen Potter is a 86 y.o. male admitted on 11/12/2019. Per consult, patient with h/o urine cultures positive for MRSA. Pharmacy has been consulted for vancomycin dosing.  Plan: Patient's current SCr 1.75, above baseline of what appears to be ~ 1.5. Will give vancomycin 1500 mg IV x 1 today. Tentative plan for vancomycin 1000 mg IV q48h thereafter with next dose 1/15. Will f/u renal function with am labs to determine need to redose or obtain random vanc level. Will continue to follow urine cultures.     Temp (24hrs), Avg:99.1 F (37.3 C), Min:99.1 F (37.3 C), Max:99.1 F (37.3 C)  Recent Labs  Lab 11/12/19 1105 11/12/19 1324  WBC 14.9*  --   CREATININE 1.75*  --   LATICACIDVEN 2.0* 1.2    CrCl cannot be calculated (Unknown ideal weight.).    No Known Allergies  Antimicrobials this admission: Vancomycin 1/13 >>  Ceftriaxone 1/13 >>   Dose adjustments this admission: NA  Microbiology results: 1/13 BCx: pending 1/13 UCx: pending    Thank you for allowing pharmacy to be a part of this patient's care.  Pricilla Riffle, PharmD 11/12/2019 3:14 PM

## 2019-11-12 NOTE — ED Provider Notes (Signed)
[facility] Emergency Department Provider Note  ____________________________________________   First MD Initiated Contact with Patient 11/12/19 1050     (approximate)  I have reviewed the triage vital signs and the nursing notes.  History  Chief Complaint Code Sepsis    HPI Allen Potter is a 86 y.o. male with history of COPD, DVT on anticoagulation, CKD, BPH, cardiomyopathy with EF 35% (August 2020), CVA who presents to the emergency department for generalized weakness, dehydration, fall.  Patient states over the last several days he has become generally weak.  He denies any lateralizing or focal weakness.  Yesterday he sustained a fall because of this.  He denies any presyncopal symptoms, no preceding chest pain, shortness of breath, lightheadedness, palpitations.  He simply states, "I just fell." He denies any loss of consciousness, head injury, or any other pain/injury related to the fall.    He states he has not been eating and drinking well, and thinks he is dehydrated.  Mucous membranes are noted to be extremely dry on arrival.  He suspects this is why he feels weak.  He does report recently finishing a course of amoxicillin for UTI.  He was hypoxic on arrival to the 80s, placed on nasal cannula.  Does not normally require supplemental oxygen.   Past Medical Hx Past Medical History:  Diagnosis Date  . Anemia    iron deficiency  . COPD (chronic obstructive pulmonary disease) (HCC)   . DVT (deep venous thrombosis) (HCC) 01/2018  . Hyperlipidemia   . Restless leg   . Stroke (HCC) 03/2018  . UTI (urinary tract infection)     Problem List Patient Active Problem List   Diagnosis Date Noted  . 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 CKD (chronic kidney disease) stage 3, GFR 30-59 ml/min 08/14/2018  . Urinary retention 07/25/2018  . Moderate mitral insufficiency 07/17/2018  . Abnormal ECG 07/03/2018  . Cardiomyopathy,  nonischemic (HCC) 07/03/2018  . History of DVT of lower extremity 06/17/2018  . Renal impairment 06/17/2018  . Sepsis (HCC) 06/12/2018  . Acute deep vein thrombosis (DVT) of popliteal vein (HCC) 06/07/2018  . Asymptomatic microscopic hematuria 06/07/2018  . Cardiac syncope 06/07/2018  . Hyperlipidemia, mixed 06/07/2018  . Dysarthria 03/18/2018  . Poor balance 03/18/2018  . Status post CVA 03/18/2018  . BPH associated with nocturia 11/16/2017  . COPD, moderate (HCC) 07/02/2014    Past Surgical Hx Past Surgical History:  Procedure Laterality Date  . CATARACT EXTRACTION Bilateral 2015  . CYSTOSCOPY WITH BIOPSY N/A 12/13/2018   Procedure: CYSTOSCOPY WITH BLADDER BIOPSY;  Surgeon: Sondra Come, MD;  Location: [facility] ORS;  Service: Urology;  Laterality: N/A;  . TRANSURETHRAL RESECTION OF PROSTATE N/A 12/13/2018   Procedure: TRANSURETHRAL RESECTION OF THE PROSTATE (TURP);  Surgeon: Sondra Come, MD;  Location: [facility] ORS;  Service: Urology;  Laterality: N/A;    Medications Prior to Admission medications   Medication Sig Start Date End Date Taking? Authorizing Provider  acetaminophen (TYLENOL) 325 MG tablet Take 2 tablets (650 mg total) by mouth every 6 (six) hours as needed for mild pain (or Fever >/= 101). 06/15/18   Gouru, Deanna Artis, MD  albuterol (VENTOLIN HFA) 108 (90 Base) MCG/ACT inhaler Inhale 2 puffs into the lungs every 6 (six) hours as needed for wheezing or shortness of breath.  10/02/17   [provider]  apixaban (ELIQUIS) 5 MG TABS tablet Take by mouth. 12/16/18   [provider]  carvedilol (COREG) 3.125  MG tablet Take 3.125 mg by mouth 2 (two) times daily.  11/18/18   [provider]  fluconazole (DIFLUCAN) 100 MG tablet Take 1 tablet (100 mg total) by mouth daily. Take 2 tablets on day 1, then one tablet per day for 13 more days. 01/07/19   Sondra Come, MD  fluticasone furoate-vilanterol (BREO ELLIPTA) 100-25 MCG/INH AEPB Inhale 1 puff into the  lungs daily. 10/02/17   [provider]  nitrofurantoin, macrocrystal-monohydrate, (MACROBID) 100 MG capsule Take 1 capsule (100 mg total) by mouth 2 (two) times daily. 01/07/19   Sondra Come, MD  nystatin cream (MYCOSTATIN) Apply 1 application topically 2 (two) times daily. 10/03/18   Michiel Cowboy A, PA-C  polyethylene glycol (MIRALAX / GLYCOLAX) packet Take 17 g by mouth daily as needed for moderate constipation.    [provider]  simvastatin (ZOCOR) 40 MG tablet Take 1 tablet (40 mg total) by mouth daily at 6 PM. Patient taking differently: Take 40 mg by mouth daily.  02/28/18   Altamese Dilling, MD  tamsulosin (FLOMAX) 0.4 MG CAPS capsule Take 1 capsule (0.4 mg total) by mouth daily. 01/07/19   Sondra Come, MD  tiotropium (SPIRIVA HANDIHALER) 18 MCG inhalation capsule Place 1 puff into inhaler and inhale daily. 10/02/17   [provider]    Allergies Patient has no known allergies.  Family Hx No family history on file.  Social Hx Social History   Tobacco Use  . Smoking status: Former Smoker    Packs/day: 0.25    Types: Cigarettes    Quit date: 2000    Years since quitting: 21.0  . Smokeless tobacco: Never Used  Substance Use Topics  . Alcohol use: Not Currently  . Drug use: Never     Review of Systems  Constitutional: Negative for fever, chills. + generalized weakness, dehydration, fall Eyes: Negative for visual changes. ENT: Negative for sore throat. Cardiovascular: Negative for chest pain. Respiratory: Negative for shortness of breath. Gastrointestinal: Negative for nausea, vomiting.  Genitourinary: Negative for dysuria. Musculoskeletal: Negative for leg swelling. Skin: Negative for rash. Neurological: Negative for for headaches.   Physical Exam  Vital Signs: ED Triage Vitals  Enc Vitals Group     BP 11/12/19 1041 (!) 117/92     Pulse Rate 11/12/19 1041 78     Resp 11/12/19 1041 20     Temp 11/12/19 1043 99.1 F  (37.3 C)     Temp Source 11/12/19 1041 Oral     SpO2 11/12/19 1041 94 %     Weight --      Height --      Head Circumference --      Peak Flow --      Pain Score 11/12/19 1043 0     Pain Loc --      Pain Edu? --      Excl. in GC? --     Constitutional: Alert and oriented. Thin and frail appearing.  Head: Normocephalic. Atraumatic. Eyes: Conjunctivae clear. Sclera anicteric. Nose: No congestion. No rhinorrhea. Mouth/Throat: Wearing mask. MM extremely dry.  Neck: No stridor.  No midline CS tenderness.  Cardiovascular: Normal rate, regular rhythm. Extremities well perfused. Respiratory: Normal respiratory effort.  On 2 L [place] for oxygen upper 80s/low 90s on RA. Gastrointestinal: Soft. Non-tender. Non-distended.  Musculoskeletal: No lower extremity edema. No deformities. FROM bilateral shoulders, elbows, hips, knees, ankles. No evidence of trauma. Neurologic:  Normal speech and language. No gross focal neurologic deficits are appreciated.  Skin: Skin is warm, dry and intact. No rash noted. Psychiatric: Mood and affect are appropriate for situation.  EKG  Personally reviewed.   Rate: 76 Rhythm: sinus Axis: normal Intervals: LBBB, wide QRS, bundle seen previously Sinus rhythm, left bundle No STEMI    Radiology  CT head:  IMPRESSION:  1. No acute intracranial abnormalities.  2. Chronic small vessel ischemic change and brain atrophy.  3. Asymmetric partial opacification of the right mastoid air cells.   CXR: IMPRESSION:  1. No acute cardiopulmonary disease.  2. Subcentimeter nodule suggested in the right lateral lung base.  Recommend follow-up chest CT. Patient had a suspicious nodule in the  left upper lobe on the prior chest CT. It is unclear whether this  underwent further diagnostic testing or therapy. It is not  visualized on the current exam.  3. Findings consistent with interstitial lung disease similar to the  prior radiograph and CT.     Procedures  Procedure(s) performed (including critical care):  Procedures   Initial Impression / Assessment and Plan / ED Course  86 y.o. male who presents to the ED for generalized weakness, fall, as above.  Ddx: UTI, dehydration, electrolyte abnormality, other infection.  Will obtain labs, imaging, including chest x-ray given his new oxygen requirement and CT head given his fall on anticoagulation.  Work up reveals AKI, creatinine 1.75 from normal previously.  Leukocytosis at 14.9.  Lactic 2.  Receiving fluids in small boluses (given hx of HF), will recheck afterwards.  CXR without acute disease, though notable for some interstitial lung disease, which could be the etiology of his mild hypoxia.  COVID testing negative.  UA possible for infection with LE, WBCs.  In the setting of his white count and lactic acid, will opt to treat.  On chart review, he does have urine culture that has been positive for Enterococcus faecalis and Staph aureus in February 2020.  We will plan for admission for further hydration, management.  Patient agreeable with plan.  Discussed with hospitalist for admission. Repeat lactate down trended and WNL.   Final Clinical Impression(s) / ED Diagnosis  Final diagnoses:  Generalized weakness  Fall, initial encounter  AKI (acute kidney injury) [facility])       Note:  This document was prepared using Dragon voice recognition software and may include unintentional dictation errors.   Lilia Pro., MD 11/12/19 [phone]

## 2019-11-12 NOTE — ED Triage Notes (Signed)
From home with recent uti and now weakness, urinary retention and falls.

## 2019-11-12 NOTE — ED Notes (Addendum)
This RN unable to perform in and out cath due to anatomy of penis, Dr. Colon Branch informed and is ok with placing condom cath. Pt had large pasty stool in brief, pt cleaned up and new brief provided

## 2019-11-12 NOTE — ED Notes (Signed)
This RN attempted to apply condom cath but was unsuccessful due to anatomy, Dr. Colon Branch informed

## 2019-11-12 NOTE — ED Notes (Signed)
Pt brief changed at this time 

## 2019-11-12 NOTE — Progress Notes (Addendum)
Admitted to room 158. Alert, oriented to person but not situation or day. Reorients easily. Coughing up mod amount thick brown mucus. Oxygen at 2l. No resp. Distress.  Says he eats chopped foods at home. Pills in applesauce. Spoke with wife [name] on the phone and gave her an update. She spoke with the patient on the phone. Dentures in cup at bedside.

## 2019-11-12 NOTE — ED Notes (Signed)
Patient's spouse contacted this technician via phone and asked for patient's current status. I relayed what I could glean from nursing notes:   1. The patient had been admitted 2. That the hospitalist had initiated the bed request 3. That the hospital's high census would likely mean patient would receive care in the ED for some time 4. That the patient was receiving IV antibiotics   Spouse voiced understanding and appreciation. She also inquired as to whether she should or could bring change of clothes for the patient. Informed her that I would have the nurse contact her with details.  ** The above is intended solely for informational and/or communicative purposes. It should in no way be considered an endorsement of any specific treatment, therapy or action. **

## 2019-11-12 NOTE — ED Notes (Signed)
Dr. Colon Branch informed of critical lactic

## 2019-11-12 NOTE — ED Notes (Signed)
Respiratory informed VBG sent to lab 

## 2019-11-12 NOTE — ED Notes (Signed)
Resting in bed with no complaints at this time, oriented to room and call bell, Nad.

## 2019-11-13 DIAGNOSIS — A419 Sepsis, unspecified organism: Secondary | ICD-10-CM | POA: Diagnosis present

## 2019-11-13 DIAGNOSIS — Z9181 History of falling: Secondary | ICD-10-CM | POA: Diagnosis not present

## 2019-11-13 DIAGNOSIS — Z8673 Personal history of transient ischemic attack (TIA), and cerebral infarction without residual deficits: Secondary | ICD-10-CM | POA: Diagnosis not present

## 2019-11-13 DIAGNOSIS — I429 Cardiomyopathy, unspecified: Secondary | ICD-10-CM | POA: Diagnosis present

## 2019-11-13 DIAGNOSIS — Z79899 Other long term (current) drug therapy: Secondary | ICD-10-CM | POA: Diagnosis not present

## 2019-11-13 DIAGNOSIS — Z87891 Personal history of nicotine dependence: Secondary | ICD-10-CM | POA: Diagnosis not present

## 2019-11-13 DIAGNOSIS — Z87440 Personal history of urinary (tract) infections: Secondary | ICD-10-CM | POA: Diagnosis not present

## 2019-11-13 DIAGNOSIS — F0390 Unspecified dementia without behavioral disturbance: Secondary | ICD-10-CM | POA: Diagnosis present

## 2019-11-13 DIAGNOSIS — Z86718 Personal history of other venous thrombosis and embolism: Secondary | ICD-10-CM | POA: Diagnosis not present

## 2019-11-13 DIAGNOSIS — N1831 Chronic kidney disease, stage 3a: Secondary | ICD-10-CM | POA: Diagnosis present

## 2019-11-13 DIAGNOSIS — Z20822 Contact with and (suspected) exposure to covid-19: Secondary | ICD-10-CM | POA: Diagnosis present

## 2019-11-13 DIAGNOSIS — R0902 Hypoxemia: Secondary | ICD-10-CM | POA: Diagnosis present

## 2019-11-13 DIAGNOSIS — N179 Acute kidney failure, unspecified: Secondary | ICD-10-CM | POA: Diagnosis present

## 2019-11-13 DIAGNOSIS — E860 Dehydration: Secondary | ICD-10-CM | POA: Diagnosis present

## 2019-11-13 DIAGNOSIS — J449 Chronic obstructive pulmonary disease, unspecified: Secondary | ICD-10-CM | POA: Diagnosis present

## 2019-11-13 DIAGNOSIS — Z7901 Long term (current) use of anticoagulants: Secondary | ICD-10-CM | POA: Diagnosis not present

## 2019-11-13 DIAGNOSIS — N3000 Acute cystitis without hematuria: Secondary | ICD-10-CM | POA: Diagnosis not present

## 2019-11-13 DIAGNOSIS — E440 Moderate protein-calorie malnutrition: Secondary | ICD-10-CM | POA: Diagnosis present

## 2019-11-13 DIAGNOSIS — Z7951 Long term (current) use of inhaled steroids: Secondary | ICD-10-CM | POA: Diagnosis not present

## 2019-11-13 DIAGNOSIS — I5022 Chronic systolic (congestive) heart failure: Secondary | ICD-10-CM | POA: Diagnosis present

## 2019-11-13 DIAGNOSIS — D509 Iron deficiency anemia, unspecified: Secondary | ICD-10-CM | POA: Diagnosis present

## 2019-11-13 DIAGNOSIS — Z681 Body mass index (BMI) 19 or less, adult: Secondary | ICD-10-CM | POA: Diagnosis not present

## 2019-11-13 DIAGNOSIS — R8281 Pyuria: Secondary | ICD-10-CM | POA: Diagnosis present

## 2019-11-13 DIAGNOSIS — E782 Mixed hyperlipidemia: Secondary | ICD-10-CM | POA: Diagnosis present

## 2019-11-13 LAB — URINE CULTURE

## 2019-11-13 LAB — BASIC METABOLIC PANEL
Anion gap: 9 (ref 5–15)
BUN: 55 mg/dL — ABNORMAL HIGH (ref 8–23)
CO2: 22 mmol/L (ref 22–32)
Calcium: 8.4 mg/dL — ABNORMAL LOW (ref 8.9–10.3)
Chloride: 111 mmol/L (ref 98–111)
Creatinine, Ser: 1.32 mg/dL — ABNORMAL HIGH (ref 0.61–1.24)
GFR calc Af Amer: 55 mL/min — ABNORMAL LOW (ref >60)
GFR calc non Af Amer: 47 mL/min — ABNORMAL LOW (ref >60)
Glucose, Bld: 93 mg/dL (ref 70–99)
Potassium: 4 mmol/L (ref 3.5–5.1)
Sodium: 142 mmol/L (ref 135–145)

## 2019-11-13 LAB — CBC
HCT: 32.1 % — ABNORMAL LOW (ref 39.0–52.0)
Hemoglobin: 9.7 g/dL — ABNORMAL LOW (ref 13.0–17.0)
MCH: 26.1 pg (ref 26.0–34.0)
MCHC: 30.2 g/dL (ref 30.0–36.0)
MCV: 86.3 fL (ref 80.0–100.0)
Platelets: 266 10*3/uL (ref 150–400)
RBC: 3.72 MIL/uL — ABNORMAL LOW (ref 4.22–5.81)
RDW: 16 % — ABNORMAL HIGH (ref 11.5–15.5)
WBC: 14.6 10*3/uL — ABNORMAL HIGH (ref 4.0–10.5)
nRBC: 0 % (ref 0.0–0.2)

## 2019-11-13 LAB — GLUCOSE, CAPILLARY: Glucose-Capillary: 94 mg/dL (ref 70–99)

## 2019-11-13 MED ORDER — CHLORHEXIDINE GLUCONATE CLOTH 2 % EX PADS: 6.0000 | MEDICATED_PAD | Freq: Every day | CUTANEOUS | Status: DC

## 2019-11-13 MED ORDER — SODIUM CHLORIDE 0.9 % IV SOLN: INTRAVENOUS | Status: DC

## 2019-11-13 NOTE — Evaluation (Signed)
Physical Therapy Evaluation Patient Details Name: Allen Potter MRN: [id number] DOB: 1927/07/04 Today's Date: 11/13/2019   History of Present Illness  Pt admitted for UTI with complaints of weakness and urinary retention. History includes COPD, CVA, DVTs, anemia, and CKD.   Clinical Impression  Pt is a pleasant 86 year old male who was admitted for UTI. Pt performs bed mobility, transfers, and ambulation with min assist and RW. Pt demonstrates deficits with endurance/mobility/balance. Pt reports he generally ambulates short distances around home, however has been having more frequent falls. Demonstrates forward flexed posture with low energy reserve, being completely exerted after short ambulation distance. Doesn't appear to be at baseline level at this time. Per patient, O2 requirement is new, however he de-sats with bed level exertion, donned for all mobility. Would benefit from skilled PT to address above deficits and promote optimal return to PLOF; recommend transition to STR upon discharge from acute hospitalization.      Follow Up Recommendations SNF    Equipment Recommendations  None recommended by PT    Recommendations for Other Services       Precautions / Restrictions Precautions Precautions: Fall Restrictions Weight Bearing Restrictions: No      Mobility  Bed Mobility Overal bed mobility: Needs Assistance Bed Mobility: Supine to Sit     Supine to sit: Min assist     General bed mobility comments: needs assist for initiation of movement. Once seated, pt with SOB symptoms and leans forearms on thighs. O2 sats at 86%, reapplied 3L of O2 with sats improving to 96%.   Transfers Overall transfer level: Needs assistance Equipment used: Rolling walker (2 wheeled) Transfers: Sit to/from Stand Sit to Stand: Min assist         General transfer comment: cues needed for hand placement. 2 attempts to stand for a few seconds prior to fatigue and sat down unexpectedly.    Ambulation/Gait Ambulation/Gait assistance: Min assist;+2 safety/equipment Gait Distance (Feet): 15 Feet Assistive device: Rolling walker (2 wheeled) Gait Pattern/deviations: Step-to pattern     General Gait Details: short step to gait pattern around bed in room to recliner. Pt fatiques quickly and demonstrates forward flexed posture. Increased SOB symptoms post exertion with reports he feels "wiped out". O2 sats on 3L of O2 remain WNL. +2 for equipment  Stairs            Wheelchair Mobility    Modified Rankin (Stroke Patients Only)       Balance Overall balance assessment: Needs assistance;History of Falls Sitting-balance support: Feet supported Sitting balance-Leahy Scale: Good Sitting balance - Comments: forward flexed posture   Standing balance support: Bilateral upper extremity supported Standing balance-Leahy Scale: Fair Standing balance comment: B knees flexed                             Pertinent Vitals/Pain Pain Assessment: No/denies pain    Home Living Family/patient expects to be discharged to:: Private residence Living Arrangements: Spouse/significant other Available Help at Discharge: Family;Available 24 hours/day Type of Home: House Home Access: Ramped entrance     Home Layout: One level Home Equipment: Walker - 2 wheels      Prior Function Level of Independence: Independent with assistive device(s)         Comments: reports he is usually indep with his RW, however recently has been having increased falls.     Hand Dominance        Extremity/Trunk Assessment  Upper Extremity Assessment Upper Extremity Assessment: Generalized weakness(B UE grossly 3+/5)    Lower Extremity Assessment Lower Extremity Assessment: Generalized weakness(B LE grossly 3+/5)       Communication   Communication: No difficulties  Cognition Arousal/Alertness: Awake/alert Behavior During Therapy: WFL for tasks assessed/performed Overall  Cognitive Status: Within Functional Limits for tasks assessed                                        General Comments      Exercises Other Exercises Other Exercises: Upon arrival, pt soiled in bed. Performed rolling to B sides for hygiene and brief applied. Needs min assist for rolling and max assist for hygiene. Also was able to perform several reps of bridging during brief donning. Fatigues quickly with exertion'   Assessment/Plan    PT Assessment Patient needs continued PT services  PT Problem List Decreased strength;Decreased activity tolerance;Decreased balance;Decreased mobility;Cardiopulmonary status limiting activity       PT Treatment Interventions Gait training;Therapeutic activities;Therapeutic exercise;Balance training    PT Goals (Current goals can be found in the Care Plan section)  Acute Rehab PT Goals Patient Stated Goal: to go home PT Goal Formulation: With patient Time For Goal Achievement: 12/12/19 Potential to Achieve Goals: Good    Frequency Min 2X/week   Barriers to discharge        Co-evaluation               AM-PAC PT "6 Clicks" Mobility  Outcome Measure Help needed turning from your back to your side while in a flat bed without using bedrails?: A Little Help needed moving from lying on your back to sitting on the side of a flat bed without using bedrails?: A Little Help needed moving to and from a bed to a chair (including a wheelchair)?: A Little Help needed standing up from a chair using your arms (e.g., wheelchair or bedside chair)?: A Little Help needed to walk in hospital room?: A Little Help needed climbing 3-5 steps with a railing? : A Lot 6 Click Score: 17    End of Session Equipment Utilized During Treatment: Gait belt;Oxygen Activity Tolerance: Patient tolerated treatment well Patient left: in chair;with chair alarm set Nurse Communication: Mobility status PT Visit Diagnosis: Unsteadiness on feet  (R26.81);Muscle weakness (generalized) (M62.81);History of falling (Z91.81);Difficulty in walking, not elsewhere classified (R26.2)    Time: 0922-0950 PT Time Calculation (min) (ACUTE ONLY): 28 min   Charges:   PT Evaluation $PT Eval Low Complexity: 1 Low PT Treatments $Therapeutic Activity: 8-22 mins        Greggory Stallion, PT, DPT [phone]   Alleah Dearman 11/13/2019, 11:21 AM

## 2019-11-13 NOTE — Progress Notes (Signed)
Pharmacy Antibiotic Note  Allen Potter is a 86 y.o. male admitted on 11/12/2019. Per consult, patient with h/o urine cultures positive for MRSA. Pharmacy has been consulted for vancomycin dosing.  1/13 Vancomycin 1500mg  IV x 1 dose @1600   Scr: 1.75>> 1.32   Plan: Tentative plan for vancomycin 1250 mg IV q48h thereafter with next dose 1/15. Random level ordered 1/15 with AM labs.   Will continue to f/u renal function to determine need to redose or obtain random vanc level. Will continue to follow urine cultures.  Height: 5\' 9"  (175.3 cm) Weight: 123 lb 0.3 oz (55.8 kg) IBW/kg (Calculated) : 70.7  Temp (24hrs), Avg:98.7 F (37.1 C), Min:97.7 F (36.5 C), Max:99.3 F (37.4 C)  Recent Labs  Lab 11/12/19 1105 11/12/19 1324 11/13/19 0449  WBC 14.9*  --  14.6*  CREATININE 1.75*  --  1.32*  LATICACIDVEN 2.0* 1.2  --     Estimated Creatinine Clearance: 29.4 mL/min (A) (by C-G formula based on SCr of 1.32 mg/dL (H)).    No Known Allergies  Antimicrobials this admission: Vancomycin 1/13 >>  Ceftriaxone 1/13 >>   Dose adjustments this admission: NA  Microbiology results: 1/13 BCx: 1 of 4 GPC  1/13 UCx: pending   Thank you for allowing pharmacy to be a part of this patient's care.  2/13, PharmD, BCPS Clinical Pharmacist 11/13/2019 9:31 AM

## 2019-11-13 NOTE — Progress Notes (Signed)
No urine output. Bladder scan 500-800. Stood patient up and noted incont. Urine on pad. Leaked from under the condom cath. Condom cath dc'd and pad placed between legs. Incont stool also.

## 2019-11-13 NOTE — Progress Notes (Signed)
Initial Nutrition Assessment  DOCUMENTATION CODES:   Underweight(Suspected PCM)  INTERVENTION:  Continue Ensure Enlive po BID, each supplement provides 350 kcal and 20 grams of protein  Magic cup BID with meals, each supplement provides 290 kcal and 9 grams of protein  Downgrade diet to dysphagia 3, pt on regular mechanical soft diet at home.  NUTRITION DIAGNOSIS:   Inadequate oral intake related to acute illness, chronic illness(UTI; CHF with EF 40%) as evidenced by percent weight loss, other (comment)(50% meal completion).  GOAL:   Patient will meet greater than or equal to 90% of their needs, Weight gain   MONITOR:   Labs, I & O's, Supplement acceptance, PO intake, Weight trends  REASON FOR ASSESSMENT:   Malnutrition Screening Tool    ASSESSMENT:  RD working remotely.   86 year old male with past medical history of HLD, COPD, stroke, RLS, DVT, iron deficiency anemia, sCHF with EF 40%, CKD3, who presented to ED with complaints of generalized weakness over the past several days and mechanical fall without significant injury due to progressive weakness. In ED, WBC 14.9, positive urinalysis and admitted for observation.  Patient eating 50% x 1 documented meal since admission on heart healthy diet. Per review of dietary information, pt on regular, mechanical soft diet at home. Will downgrade patient diet to dysphagia 3 and monitor for po improvements to oral intake of meals.   Current wt 55.8 kg (122.76 lbs) Per history, stable 64 kg - 65.3 kg from August 2019-March 2020 and has lost 20.9 lb (14.5%) in the 10 months which is significant for time frame.   Patient is underweight for BMI and is concerning for malnutrition given noted weight loss and advanced age. Patient is receiving Ensure twice daily per review of medications. RD will continue to monitor po intake of meals/supplements and provide Magic Cup with lunch and dinner meals to aid with calorie/protien needs. Will plan  to complete NFPE at follow-up to assess for fat and muscle depletions.  Medications reviewed and include: Risaquad, Coreg, Niferex, B12, IV NaCl, IV Rocephin  Labs: BUN 55 (H), Cr 1.32 (H), WBC 14.6 (H), Hgb 9.7 (L)  NUTRITION - FOCUSED PHYSICAL EXAM: Unable to complete at this time, RD working remotely.  Diet Order:   Diet Order            Diet Heart Room service appropriate? Yes; Fluid consistency: Thin  Diet effective now              EDUCATION NEEDS:   No education needs have been identified at this time  Skin:  Skin Assessment: Reviewed RN Assessment  Last BM:  1/14 (type 6)  Height:   Ht Readings from Last 1 Encounters:  11/12/19 5\' 9"  (1.753 m)    Weight:   Wt Readings from Last 1 Encounters:  11/13/19 55.8 kg    Ideal Body Weight:  72.3 kg  BMI:  Body mass index is 18.17 kg/m.  Estimated Nutritional Needs:   Kcal:  1575-1700  Protein:  79-85  Fluid:  > 1.3 L/day   11/15/19, RD, LDN Clinical Nutrition [place] Telephone [phone] After Hours/Weekend Pager: [phone]

## 2019-11-13 NOTE — Progress Notes (Signed)
PHARMACY - PHYSICIAN COMMUNICATION CRITICAL VALUE ALERT - BLOOD CULTURE IDENTIFICATION (BCID)  Allen Potter is an 86 y.o. male who presented to [facility] on 11/12/2019 with a chief complaint of generalized weakness and falls.  Assessment:  WBC 14.9 >> 14.6, LA 2.0 >> 1.2, UA leukocytes +, h/o Enterococcus faecalis, MRSA UCx 6 months ago.  Name of physician (or Provider) Contacted: Vanc, cefepime  Current antibiotics: Manuela Schwartz  Changes to prescribed antibiotics recommended:  Patient is on recommended antibiotics - No changes needed -- will continue to monitor and f/u on cx/sx.  Results for orders placed or performed during the hospital encounter of 06/12/18  Blood Culture ID Panel (Reflexed) (Collected: 06/12/2018 11:30 AM)  Result Value Ref Range   Enterococcus species NOT DETECTED NOT DETECTED   Listeria monocytogenes NOT DETECTED NOT DETECTED   Staphylococcus species NOT DETECTED NOT DETECTED   Staphylococcus aureus (BCID) NOT DETECTED NOT DETECTED   Streptococcus species NOT DETECTED NOT DETECTED   Streptococcus agalactiae NOT DETECTED NOT DETECTED   Streptococcus pneumoniae NOT DETECTED NOT DETECTED   Streptococcus pyogenes NOT DETECTED NOT DETECTED   Acinetobacter baumannii NOT DETECTED NOT DETECTED   Enterobacteriaceae species DETECTED (A) NOT DETECTED   Enterobacter cloacae complex NOT DETECTED NOT DETECTED   Escherichia coli DETECTED (A) NOT DETECTED   Klebsiella oxytoca NOT DETECTED NOT DETECTED   Klebsiella pneumoniae NOT DETECTED NOT DETECTED   Proteus species NOT DETECTED NOT DETECTED   Serratia marcescens NOT DETECTED NOT DETECTED   Carbapenem resistance NOT DETECTED NOT DETECTED   Haemophilus influenzae NOT DETECTED NOT DETECTED   Neisseria meningitidis NOT DETECTED NOT DETECTED   Pseudomonas aeruginosa NOT DETECTED NOT DETECTED   Candida albicans NOT DETECTED NOT DETECTED   Candida glabrata NOT DETECTED NOT DETECTED   Candida krusei NOT DETECTED NOT  DETECTED   Candida parapsilosis NOT DETECTED NOT DETECTED   Candida tropicalis NOT DETECTED NOT DETECTED   Thomasene Ripple, PharmD, BCPS Clinical Pharmacist 11/13/2019  6:53 AM

## 2019-11-13 NOTE — Evaluation (Signed)
Occupational Therapy Evaluation Patient Details Name: Allen Potter MRN: [id number] DOB: 01-02-1927 Today's Date: 11/13/2019    History of Present Illness Pt admitted for UTI with complaints of weakness and urinary retention. History includes COPD, CVA, DVTs, anemia, and CKD.    Clinical Impression   Pt was seen for OT evaluation this date. Prior to hospital admission, pt was independent with RW for functional mobility and performing basic ADL without assist. Pt lives with his spouse who manages medications, groceries, cleaning, and meal prep. Pt endorses 2 falls in past 12 months but is unable to verbalize details/causes stating, "I just fell." Pt endorses poor appetite but does state he drinks plenty of water at home. Currently pt demonstrates impairments as described below (See OT problem list below) which functionally limit her ability to perform ADL/self-care tasks. Pt currently requires Min A for functional ADL mobility and LB ADL tasks.  Pt would benefit from skilled OT to address noted impairments and functional limitations (see below for any additional details) in order to maximize safety and independence while minimizing falls risk and caregiver burden.  Upon hospital discharge, recommend pt discharge with SNF.    Follow Up Recommendations  SNF    Equipment Recommendations  3 in 1 bedside commode    Recommendations for Other Services       Precautions / Restrictions Precautions Precautions: Fall Precaution Comments: watch O2 sats with exertion Restrictions Weight Bearing Restrictions: No      Mobility Bed Mobility Overal bed mobility: Needs Assistance Bed Mobility: Supine to Sit     Supine to sit: Min assist     General bed mobility comments: deferred, pt up in recliner  Transfers Overall transfer level: Needs assistance Equipment used: Rolling walker (2 wheeled) Transfers: Sit to/from Stand Sit to Stand: Min assist         General transfer comment: cues  needed for hand placement. 2 attempts to stand for a few seconds prior to fatigue and sat down unexpectedly.     Balance Overall balance assessment: Needs assistance;History of Falls Sitting-balance support: Feet supported Sitting balance-Leahy Scale: Good Sitting balance - Comments: forward flexed posture   Standing balance support: Bilateral upper extremity supported Standing balance-Leahy Scale: Fair Standing balance comment: B knees flexed                           ADL either performed or assessed with clinical judgement   ADL Overall ADL's : Needs assistance/impaired     Grooming: Oral care;Sitting;Set up;Minimal assistance;Wash/dry hands;Wash/dry face Grooming Details (indicate cue type and reason): Pt performed denture mgt and cleaning with Min A for thoroughness of cleansing                               General ADL Comments: Min A for LB ADL and Min A for functional ADL transfers and mobility efforts     Vision Baseline Vision/History: Wears glasses Wears Glasses: At all times Patient Visual Report: No change from baseline Vision Assessment?: No apparent visual deficits     Perception     Praxis      Pertinent Vitals/Pain Pain Assessment: No/denies pain     Hand Dominance Right   Extremity/Trunk Assessment Upper Extremity Assessment Upper Extremity Assessment: Generalized weakness   Lower Extremity Assessment Lower Extremity Assessment: Generalized weakness       Communication Communication Communication: No difficulties   Cognition Arousal/Alertness:  Awake/alert Behavior During Therapy: WFL for tasks assessed/performed Overall Cognitive Status: Within Functional Limits for tasks assessed                                     General Comments       Exercises Exercises: Other exercises Other Exercises Other Exercises: Upon arrival, pt soiled in bed. Performed rolling to B sides for hygiene and brief applied.  Needs min assist for rolling and max assist for hygiene. Also was able to perform several reps of bridging during brief donning. Fatigues quickly with exertion'   Shoulder Instructions      Home Living Family/patient expects to be discharged to:: Private residence Living Arrangements: Spouse/significant other Available Help at Discharge: Family;Available 24 hours/day Type of Home: House Home Access: Ramped entrance     Home Layout: One level     Bathroom Shower/Tub: Producer, television/film/video: Handicapped height     Home Equipment: Environmental consultant - 2 wheels          Prior Functioning/Environment Level of Independence: Independent with assistive device(s)        Comments: reports he is usually indep with his RW, however recently has been having increased falls. Wife manages meds, cooking, cleaning, and groceries        OT Problem List: Decreased strength;Decreased activity tolerance;Impaired balance (sitting and/or standing);Decreased knowledge of use of DME or AE      OT Treatment/Interventions: Self-care/ADL training;Therapeutic exercise;Therapeutic activities;DME and/or AE instruction;Patient/family education;Balance training    OT Goals(Current goals can be found in the care plan section) Acute Rehab OT Goals Patient Stated Goal: to go home Time For Goal Achievement: 12/03/2019 Potential to Achieve Goals: Good ADL Goals Pt Will Perform Lower Body Dressing: with min guard assist;sit to/from stand Pt Will Transfer to Toilet: with min guard assist;ambulating(BSC over toilet, LRAD for amb)  OT Frequency: Min 1X/week   Barriers to D/C:            Co-evaluation              AM-PAC OT "6 Clicks" Daily Activity     Outcome Measure Help from another person eating meals?: None Help from another person taking care of personal grooming?: A Little Help from another person toileting, which includes using toliet, bedpan, or urinal?: A Little Help from another person  bathing (including washing, rinsing, drying)?: A Lot Help from another person to put on and taking off regular upper body clothing?: A Little Help from another person to put on and taking off regular lower body clothing?: A Lot 6 Click Score: 17   End of Session    Activity Tolerance: Patient tolerated treatment well Patient left: in chair;with call bell/phone within reach;with chair alarm set  OT Visit Diagnosis: Other abnormalities of gait and mobility (R26.89);Muscle weakness (generalized) (M62.81);History of falling (Z91.81)                Time: 0962-8366 OT Time Calculation (min): 24 min Charges:  OT General Charges $OT Visit: 1 Visit OT Evaluation $OT Eval Low Complexity: 1 Low OT Treatments $Self Care/Home Management : 8-22 mins  Richrd Prime, MPH, MS, OTR/L ascom [phone] 11/13/19, 1:26 PM

## 2019-11-13 NOTE — TOC Progression Note (Signed)
Transition of Care [facility]) - Progression Note    Patient Details  Name: ALEXANDE SHEERIN MRN: [id number] Date of Birth: 1927-01-21  [facility]) CM/SW Contact  Joaquina Nissen, Lemar Livings, LCSW Phone Number: 11/13/2019, 3:48 PM  Clinical Narrative: Bed offer via [facility] once medically stable and ready for transfer. Wife and pt on board with this plan when ready.      Expected Discharge Plan: Skilled Nursing Facility Barriers to Discharge: Continued Medical Work up  Expected Discharge Plan and Services Expected Discharge Plan: Skilled Nursing Facility In-house Referral: Clinical Social Work     Living arrangements for the past 2 months: Single Family Home                                       Social Determinants of Health (SDOH) Interventions    Readmission Risk Interventions Readmission Risk Prevention Plan 06/15/2018  Transportation Screening Complete  PCP or Specialist Appt within 5-7 Days Complete  Home Care Screening Complete  Medication Review (RN CM) Complete  Some recent data might be hidden

## 2019-11-13 NOTE — TOC Initial Note (Addendum)
Transition of Care [facility]) - Initial/Assessment Note    Patient Details  Name: Allen Potter MRN: [id number] Date of Birth: [birth year]  Transition of Care [facility]) CM/SW Contact:    Lucy Chris, LCSW Phone Number: 11/13/2019, 1:51 PM  Clinical Narrative:  Pt sleeping very deeply so contacted wife via telephone to discuss discharge recommendations for when ready for discharge. She reports he was independent and able to get around and do his ADL's prior to admission. At times he used a cane but often did not. The O2 is new he did not require this at home. She has a daughter in [place] and he has a son locally, both are involved. Wife has no health issues and does well for her 81 years. Pt has been to [facility] before and did well and went home. This is their preference for when he is ready for discharge.            Expected Discharge Plan: Skilled Nursing Facility Barriers to Discharge: Continued Medical Work up   Patient Goals and CMS Choice Patient states their goals for this hospitalization and ongoing recovery are:: Wants to get better and go hme eventually with wife.      Expected Discharge Plan and Services Expected Discharge Plan: Skilled Nursing Facility In-house Referral: Clinical Social Work     Living arrangements for the past 2 months: Single Family Home                                      Prior Living Arrangements/Services Living arrangements for the past 2 months: Single Family Home Lives with:: Spouse Patient language and need for interpreter reviewed:: Yes Do you feel safe going back to the place where you live?: Yes      Need for Family Participation in Patient Care: Yes (Comment) Care giver support system in place?: Yes (comment) Current home services: DME(cane)    Activities of Daily Living Home Assistive Devices/Equipment: Dan Humphreys (specify type) ADL Screening (condition at time of admission) Patient's cognitive ability adequate to safely complete daily  activities?: Yes Is the patient deaf or have difficulty hearing?: No Does the patient have difficulty seeing, even when wearing glasses/contacts?: Yes Does the patient have difficulty concentrating, remembering, or making decisions?: No Patient able to express need for assistance with ADLs?: Yes Does the patient have difficulty dressing or bathing?: Yes Independently performs ADLs?: No Communication: Independent Dressing (OT): Independent Grooming: Independent Feeding: Independent Bathing: Independent Toileting: Needs assistance Is this a change from baseline?: Pre-admission baseline In/Out Bed: Independent Walks in Home: Independent with device (comment)(walker) Does the patient have difficulty walking or climbing stairs?: Yes Weakness of Legs: Both Weakness of Arms/Hands: None  Permission Sought/Granted                  Emotional Assessment Appearance:: Appears stated age Attitude/Demeanor/Rapport: Unable to Assess(sleeping unable to awaken) Affect (typically observed): Calm Orientation: : Oriented to Self, Oriented to Place, Oriented to Situation      Admission diagnosis:  UTI (urinary tract infection) [N39.0] Generalized weakness [R53.1] AKI (acute kidney injury) (HCC) [N17.9] Fall, initial encounter [W19.XXXA] Sepsis [facility]) [A41.9] Patient Active Problem List   Diagnosis Date Noted  . Iron deficiency anemia 11/12/2019  . Protein-calorie malnutrition, moderate (HCC) 11/12/2019  . Chronic systolic CHF (congestive heart failure) (HCC) 11/12/2019  . UTI (urinary tract infection)   . Fall   . Acute renal failure superimposed  on stage 3a chronic kidney disease ([place])   . Generalized weakness   . 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 CKD (chronic kidney disease) stage 3, GFR 30-59 ml/min 08/14/2018  . Urinary retention 07/25/2018  . Moderate mitral insufficiency 07/17/2018  . Abnormal ECG 07/03/2018  . Cardiomyopathy, nonischemic ([place])  07/03/2018  . History of DVT of lower extremity 06/17/2018  . Renal impairment 06/17/2018  . Sepsis ([place]) 06/12/2018  . Acute deep vein thrombosis (DVT) of popliteal vein (HCC) 06/07/2018  . Asymptomatic microscopic hematuria 06/07/2018  . Cardiac syncope 06/07/2018  . Hyperlipidemia, mixed 06/07/2018  . Dysarthria 03/18/2018  . Poor balance 03/18/2018  . Status post CVA 03/18/2018  . BPH associated with nocturia 11/16/2017  . COPD, moderate ([place]) 07/02/2014   PCP:  Tracie Harrier, MD Pharmacy:   [facility], [place] [address] [place] [address] Phone: [phone] Fax: [phone]     Social Determinants of Health (SDOH) Interventions    Readmission Risk Interventions Readmission Risk Prevention Plan 06/15/2018  Transportation Screening Complete  PCP or Specialist Appt within 5-7 Days Complete  Home Care Screening Complete  Medication Review (RN CM) Complete  Some recent data might be hidden   2:00 pm FL2 completed has existing [place]-bed search started

## 2019-11-13 NOTE — NC FL2 (Signed)
[place] MEDICAID [id number] LEVEL OF CARE SCREENING TOOL     IDENTIFICATION  Patient Name: Allen Potter Birthdate: 03/12/1927 Sex: male Admission Date (Current Location): 11/12/2019  [place] and [place] Number:  Chiropodist and Address:  [facility], [address] [place], [place] [address]      Provider Number: [id number]  Attending Physician Name and Address:  Alberteen Sam, *  Relative Name and Phone Number:  Doris-wife  [phone]    Current Level of Care: Hospital Recommended Level of Care: Skilled Nursing Facility Prior Approval Number:    Date Approved/Denied:   PASRR Number: [id number]  Discharge Plan: SNF    Current Diagnoses: Patient Active Problem List   Diagnosis Date Noted  . Iron deficiency anemia 11/12/2019  . Protein-calorie malnutrition, moderate (HCC) 11/12/2019  . Chronic systolic CHF (congestive heart failure) (HCC) 11/12/2019  . UTI (urinary tract infection)   . Fall   . Acute renal failure superimposed on stage 3a chronic kidney disease (HCC)   . Generalized weakness   . 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 CKD (chronic kidney disease) stage 3, GFR 30-59 ml/min 08/14/2018  . Urinary retention 07/25/2018  . Moderate mitral insufficiency 07/17/2018  . Abnormal ECG 07/03/2018  . Cardiomyopathy, nonischemic (HCC) 07/03/2018  . History of DVT of lower extremity 06/17/2018  . Renal impairment 06/17/2018  . Sepsis (HCC) 06/12/2018  . Acute deep vein thrombosis (DVT) of popliteal vein (HCC) 06/07/2018  . Asymptomatic microscopic hematuria 06/07/2018  . Cardiac syncope 06/07/2018  . Hyperlipidemia, mixed 06/07/2018  . Dysarthria 03/18/2018  . Poor balance 03/18/2018  . Status post CVA 03/18/2018  . BPH associated with nocturia 11/16/2017  . COPD, moderate (HCC) 07/02/2014    Orientation RESPIRATION BLADDER Height & Weight     Self, Situation, Place  O2(2 liters  continuous-may wean while here) Incontinent(condom cath) Weight: 123 lb 0.3 oz (55.8 kg) Height:  5\' 9"  (175.3 cm)  BEHAVIORAL SYMPTOMS/MOOD NEUROLOGICAL BOWEL NUTRITION STATUS      Continent Diet(Dys 2 thin liquids)  AMBULATORY STATUS COMMUNICATION OF NEEDS Skin   Limited Assist Verbally Normal                       Personal Care Assistance Level of Assistance  Bathing, Dressing Bathing Assistance: Limited assistance Feeding assistance: Independent Dressing Assistance: Limited assistance     Functional Limitations Info             SPECIAL CARE FACTORS FREQUENCY  PT (By licensed PT), OT (By licensed OT)     PT Frequency: 5x week OT Frequency: 5x week            Contractures Contractures Info: Not present    Additional Factors Info  Allergies, Code Status Code Status Info: Full Code Allergies Info: No known allergies           Current Medications (11/13/2019):  This is the current hospital active medication list Current Facility-Administered Medications  Medication Dose Route Frequency Provider Last Rate Last Admin  . 0.9 %  sodium chloride infusion   Intravenous Continuous 11/15/2019, MD 75 mL/hr at 11/13/19 0517 New Bag at 11/13/19 0517  . 0.9 %  sodium chloride infusion   Intravenous Continuous 11/15/19, MD 100 mL/hr at 11/13/19 1256 New Bag at 11/13/19 1256  . acetaminophen (TYLENOL) tablet 650 mg  650 mg Oral Q6H PRN 11/15/19, MD       Or  .  acetaminophen (TYLENOL) suppository 650 mg  650 mg Rectal Q6H PRN Ivor Costa, MD      . acidophilus (RISAQUAD) capsule 1 capsule  1 capsule Oral Daily Ivor Costa, MD   1 capsule at 11/13/19 1000  . albuterol (PROVENTIL) (2.5 MG/3ML) 0.083% nebulizer solution 3 mL  3 mL Inhalation Q4H PRN Ivor Costa, MD      . apixaban Arne [place]) tablet 2.5 mg  2.5 mg Oral BID Ivor Costa, MD   2.5 mg at 11/13/19 1000  . carvedilol (COREG) tablet 3.125 mg  3.125 mg Oral BID Ivor Costa, MD   3.125 mg at 11/13/19 1009  .  cefTRIAXone (ROCEPHIN) 1 g in sodium chloride 0.9 % 100 mL IVPB  1 g Intravenous Q24H Ivor Costa, MD 200 mL/hr at 11/13/19 1345 1 g at 11/13/19 1345  . Chlorhexidine Gluconate Cloth 2 % PADS 6 each  6 each Topical Daily Ivor Costa, MD      . dextromethorphan-guaiFENesin [facility] DM) 30-600 MG per 12 hr tablet 1 tablet  1 tablet Oral BID Ivor Costa, MD   1 tablet at 11/13/19 0959  . feeding supplement (ENSURE ENLIVE) (ENSURE ENLIVE) liquid 237 mL  237 mL Oral BID BM Ivor Costa, MD   237 mL at 11/13/19 1340  . fluticasone furoate-vilanterol (BREO ELLIPTA) 100-25 MCG/INH 1 puff  1 puff Inhalation Daily Ivor Costa, MD   1 puff at 11/13/19 1000  . hydrALAZINE (APRESOLINE) tablet 25 mg  25 mg Oral TID PRN Ivor Costa, MD      . ipratropium (ATROVENT) nebulizer solution 0.5 mg  2.5 mL Inhalation Q4H Ivor Costa, MD   0.5 mg at 11/13/19 1205  . iron polysaccharides (NIFEREX) capsule 150 mg  150 mg Oral Daily Ivor Costa, MD   150 mg at 11/13/19 0959  . ondansetron (ZOFRAN) injection 4 mg  4 mg Intravenous Q8H PRN Ivor Costa, MD      . simvastatin (ZOCOR) tablet 40 mg  40 mg Oral q1800 Ivor Costa, MD   40 mg at 11/12/19 2013  . vancomycin variable dose per unstable renal function (pharmacist dosing)   Does not apply See admin instructions Ivor Costa, MD      . vitamin B-12 (CYANOCOBALAMIN) tablet 1,000 mcg  1,000 mcg Oral Daily Ivor Costa, MD   1,000 mcg at 11/13/19 0258     Discharge Medications: Please see discharge summary for a list of discharge medications.  Relevant Imaging Results:  Relevant Lab Results:   Additional Information SSN: [id number]  Kamaree Berkel, Gardiner Rhyme, LCSW

## 2019-11-13 NOTE — Progress Notes (Signed)
PROGRESS NOTE    Allen Potter  MOL:[id number] DOB: 1927/09/17 DOA: 11/12/2019 PCP: Barbette Reichmann, MD      Brief Narrative:  Allen Potter is a 85 y.o. male with medical history significant of hyperlipidemia, COPD, stroke, RLS, DVT on Eliquis, iron deficiency anemia, sCHF with EF of 40%, CKD stage IIIa, who presents with generalized weakness and fall.  Patient states that he has been having generalized weakness in the past several days, which has been gradually worsening.  No unilateral numbness or tinglings in extremities, no facial droop or slurred speech.Yesterday he sustained a fall due to weakness, but no significant injury.  No headache or neck pain.  Patient denies any chest pain, cough, fever or chills.  He denies shortness breath, but his oxygen saturation is 80s at arrival to ED. Pt was put on 2L nasal cannula oxygen.  Oxygen saturation improved later to 100% on room air, and then patient is off oxygen.  Patient does not have nausea, vomiting, diarrhea or abdominal pain.  He was recently treated for UTI and completed a course of amoxicillin.  Of note, patient had positive urine culture for MRSA on 12/03/2018  ED Course: pt was found to have WBC 14.9, positive urinalysis (cloudy appearance, trace amount of leukocyte, WBC 11-20), lactic acid 2.0, 1.2, INR 1.6, troponin 16, 15, BUN 132, worsening renal function, temperature 99.1, blood pressure 117/72, heart rate 72, chest x-ray negative for infiltration.  CT head is negative for acute intracranial abnormalities the patient is placed on MedSurg bed for observation.   Assessment & Plan:  Urinary tract infection History of MRSA UTI -Continue ceftriaxone, and vancomycin -Follow urine culture  Dehydration The patient still appears dehydrated -Continue IV fluids  DVT -Continue Eliquis  COPD No active bronchospasm -Continue ICS, LABA -Continue DuoNeb  AKI on CKD stage IIIa Baseline creatinine 1.1, patient's creatinine 1.7  on admission, likely prerenal. -Continue IV fluids -Trend BMP -Hold lisinopril  Iron deficiency anemia  Moderate protein calorie malnutrition -Continue Ensure  Chronic systolic CHF -Continue carvedilol -Continue simvastatin -Hold lisinopril  Anemia -Continue iron    Disposition: The patient was admitted with UTI, AKI, confusion, and dehydration.   I will discharge when I am able to stop his IV fluids and switch to oral antibiotics, hopefully tomorrow.        MDM: The below labs and imaging reports were reviewed and summarized above.  Medication management as above.   DVT prophylaxis: Eliquis Code Status: Full code Family Communication:     Consultants:     Procedures:     Antimicrobials:   Vancomycin  Ceftriaxone   Subjective: Patient is feeling somewhat better.  Still very tired and weak.  Still somewhat weak dizzy.  Objective: Vitals:   11/13/19 0824 11/13/19 1205 11/13/19 1433 11/13/19 1548  BP: (!) 124/59  118/69   Pulse: 75  66   Resp: 18  18   Temp: 98.7 F (37.1 C)  97.6 F (36.4 C)   TempSrc: Oral  Oral   SpO2: 100% 92% 98% 96%  Weight:      Height:        Intake/Output Summary (Last 24 hours) at 11/13/2019 1900 Last data filed at 11/13/2019 1346 Gross per 24 hour  Intake 781.27 ml  Output --  Net 781.27 ml   Filed Weights   11/12/19 1700 11/12/19 2113 11/13/19 0335  Weight: 59.5 kg 56.7 kg 55.8 kg    Examination: General appearance: Thin elderly adult male, alert and in  no acute distress.  Sitting in recliner HEENT: Anicteric, conjunctiva pink, lids and lashes normal. No nasal deformity, discharge, epistaxis.  Lips dry, oropharynx tacky dry, no oral lesions.   Skin: Warm and dry.  No jaundice.  No suspicious rashes or lesions. Cardiac: RRR, nl S1-S2, no murmurs appreciated.  Capillary refill is brisk.  JVP normal.  No LE edema.  Radial pulses 2+ and symmetric. Respiratory: Normal respiratory rate and rhythm.  CTAB  without rales or wheezes. Abdomen: Abdomen soft.  No TTP or guarding. No ascites, distension, hepatosplenomegaly.   MSK: No deformities or effusions. Neuro: Awake and alert.  EOMI, moves all extremities with generalized weakness, severe. Speech fluent.    Psych: Sensorium intact and responding to questions, attention normal. Affect .  Judgment and insight appear normal.    Data Reviewed: I have personally reviewed following labs and imaging studies:  CBC: Recent Labs  Lab 11/12/19 1105 11/13/19 0449  WBC 14.9* 14.6*  NEUTROABS 12.1*  --   HGB 10.1* 9.7*  HCT 33.8* 32.1*  MCV 85.1 86.3  PLT 306 266   Basic Metabolic Panel: Recent Labs  Lab 11/12/19 1105 11/13/19 0449  NA 139 142  K 4.2 4.0  CL 104 111  CO2 25 22  GLUCOSE 106* 93  BUN 59* 55*  CREATININE 1.75* 1.32*  CALCIUM 8.9 8.4*   GFR: Estimated Creatinine Clearance: 29.4 mL/min (A) (by C-G formula based on SCr of 1.32 mg/dL (H)). Liver Function Tests: Recent Labs  Lab 11/12/19 1105  AST 17  ALT 13  ALKPHOS 55  BILITOT 0.7  PROT 8.6*  ALBUMIN 2.9*   No results for input(s): LIPASE, AMYLASE in the last 168 hours. No results for input(s): AMMONIA in the last 168 hours. Coagulation Profile: Recent Labs  Lab 11/12/19 1105  INR 1.6*   Cardiac Enzymes: No results for input(s): CKTOTAL, CKMB, CKMBINDEX, TROPONINI in the last 168 hours. BNP (last 3 results) No results for input(s): PROBNP in the last 8760 hours. HbA1C: No results for input(s): HGBA1C in the last 72 hours. CBG: No results for input(s): GLUCAP in the last 168 hours. Lipid Profile: No results for input(s): CHOL, HDL, LDLCALC, TRIG, CHOLHDL, LDLDIRECT in the last 72 hours. Thyroid Function Tests: No results for input(s): TSH, T4TOTAL, FREET4, T3FREE, THYROIDAB in the last 72 hours. Anemia Panel: No results for input(s): VITAMINB12, FOLATE, FERRITIN, TIBC, IRON, RETICCTPCT in the last 72 hours. Urine analysis:    Component Value  Date/Time   COLORURINE YELLOW (A) 11/12/2019 1305   APPEARANCEUR CLOUDY (A) 11/12/2019 1305   APPEARANCEUR Cloudy (A) 12/03/2018 1113   LABSPEC 1.020 11/12/2019 1305   PHURINE 5.0 11/12/2019 1305   GLUCOSEU NEGATIVE 11/12/2019 1305   HGBUR LARGE (A) 11/12/2019 1305   BILIRUBINUR NEGATIVE 11/12/2019 1305   BILIRUBINUR Negative 12/03/2018 1113   KETONESUR NEGATIVE 11/12/2019 1305   PROTEINUR 100 (A) 11/12/2019 1305   NITRITE NEGATIVE 11/12/2019 1305   LEUKOCYTESUR TRACE (A) 11/12/2019 1305   Sepsis Labs: @LABRCNTIP (procalcitonin:4,lacticacidven:4)  ) Recent Results (from the past 240 hour(s))  Blood Culture (routine x 2)     Status: None (Preliminary result)   Collection Time: 11/12/19 11:07 AM   Specimen: BLOOD  Result Value Ref Range Status   Specimen Description BLOOD RIGHT WRIST  Final   Special Requests   Final    BOTTLES DRAWN AEROBIC AND ANAEROBIC Blood Culture adequate volume   Culture   Final    NO GROWTH < 24 HOURS Performed at [facility],  [place], [place] [address]    Report Status PENDING  Incomplete  Blood Culture (routine x 2)     Status: None (Preliminary result)   Collection Time: 11/12/19 11:34 AM   Specimen: BLOOD  Result Value Ref Range Status   Specimen Description BLOOD LEFT ANTECUBITAL  Final   Special Requests   Final    BOTTLES DRAWN AEROBIC AND ANAEROBIC Blood Culture adequate volume   Culture  Setup Time   Final    GRAM POSITIVE COCCI IN BOTH AEROBIC AND ANAEROBIC BOTTLES CRITICAL RESULT CALLED TO, READ BACK BY AND VERIFIED WITH: DAVID BESANTI AT 0347 ON 11/13/19 RWW Performed at [facility], [address] [place], [place] [address]    Culture GRAM POSITIVE COCCI  Final   Report Status PENDING  Incomplete  Respiratory Panel by RT PCR (Flu A&B, Covid) - Nasopharyngeal Swab     Status: None   Collection Time: 11/12/19 11:35 AM   Specimen: Nasopharyngeal Swab  Result Value Ref Range Status   SARS  Coronavirus 2 by RT PCR NEGATIVE NEGATIVE Final    Comment: (NOTE) SARS-CoV-2 target nucleic acids are NOT DETECTED. The SARS-CoV-2 RNA is generally detectable in upper respiratoy specimens during the acute phase of infection. The lowest concentration of SARS-CoV-2 viral copies this assay can detect is 131 copies/mL. A negative result does not preclude SARS-Cov-2 infection and should not be used as the sole basis for treatment or other patient management decisions. A negative result may occur with  improper specimen collection/handling, submission of specimen other than nasopharyngeal swab, presence of viral mutation(s) within the areas targeted by this assay, and inadequate number of viral copies (<131 copies/mL). A negative result must be combined with clinical observations, patient history, and epidemiological information. The expected result is Negative. Fact Sheet for Patients:  PinkCheek.be Fact Sheet for Healthcare Providers:  GravelBags.it This test is not yet ap proved or cleared by the [place] FDA and  has been authorized for detection and/or diagnosis of SARS-CoV-2 by FDA under an Emergency Use Authorization (EUA). This EUA will remain  in effect (meaning this test can be used) for the duration of the COVID-19 declaration under Section 564(b)(1) of the Act, 21 U.S.C. section 360bbb-3(b)(1), unless the authorization is terminated or revoked sooner.    Influenza A by PCR NEGATIVE NEGATIVE Final   Influenza B by PCR NEGATIVE NEGATIVE Final    Comment: (NOTE) The Xpert Xpress SARS-CoV-2/FLU/RSV assay is intended as an aid in  the diagnosis of influenza from Nasopharyngeal swab specimens and  should not be used as a sole basis for treatment. Nasal washings and  aspirates are unacceptable for Xpert Xpress SARS-CoV-2/FLU/RSV  testing. Fact Sheet for Patients: PinkCheek.be Fact Sheet  for Healthcare Providers: GravelBags.it This test is not yet approved or cleared by the [place] FDA and  has been authorized for detection and/or diagnosis of SARS-CoV-2 by  FDA under an Emergency Use Authorization (EUA). This EUA will remain  in effect (meaning this test can be used) for the duration of the  Covid-19 declaration under Section 564(b)(1) of the Act, 21  U.S.C. section 360bbb-3(b)(1), unless the authorization is  terminated or revoked. Performed at [facility], [address], [place],  [address]   Urine culture     Status: Abnormal   Collection Time: 11/12/19  1:05 PM   Specimen: In/Out Cath Urine  Result Value Ref Range Status   Specimen Description   Final    IN/OUT  CATH URINE Performed at [facility], [address], [place], [place] [address]    Special Requests   Final    NONE Performed at [facility], [address] [place], [place] [address]    Culture MULTIPLE SPECIES PRESENT, SUGGEST RECOLLECTION (A)  Final   Report Status 11/13/2019 FINAL  Final         Radiology Studies: CT Head Wo Contrast  Result Date: 11/12/2019 CLINICAL DATA:  Recent UTI. Weakness, urinary retention and falls. EXAM: CT HEAD WITHOUT CONTRAST TECHNIQUE: Contiguous axial images were obtained from the base of the skull through the vertex without intravenous contrast. COMPARISON:  Brain MRI 05/21/2018 FINDINGS: Brain: No evidence of acute infarction, hemorrhage, hydrocephalus, extra-axial collection or mass lesion/mass effect. There is mild diffuse low-attenuation within the subcortical and periventricular white matter compatible with chronic microvascular disease. Prominence of the CSF spaces noted compatible with brain atrophy. Vascular: No hyperdense vessel or unexpected calcification. Skull: Normal. Negative for fracture or focal lesion. Sinuses/Orbits: Asymmetric partial opacification of the right mastoid air  cells. The remaining mastoid air cells and paranasal sinuses appear clear. Other: None. IMPRESSION: 1. No acute intracranial abnormalities. 2. Chronic small vessel ischemic change and brain atrophy. 3. Asymmetric partial opacification of the right mastoid air cells. Electronically Signed   By: Signa Kell M.D.   On: 11/12/2019 11:38   DG Chest Port 1 View  Result Date: 11/12/2019 CLINICAL DATA:  Hypoxia per ordering notes. Patient is from home with recent uti and now weakness, urinary retention and falls. Hx of COPD. Former smoker. EXAM: PORTABLE CHEST 1 VIEW COMPARISON:  Chest radiograph, 06/26/2018 and prior exams. Prior chest CT, 06/26/2018. FINDINGS: Cardiac silhouette is normal in size. Small to moderate hiatal hernia. Mediastinal or hilar masses. Bilateral irregular interstitial thickening with a lower lung zone predominance, similar to the prior exam. There is a subcentimeter nodular opacity at the right lateral lung base, not evident on the prior study. The left upper lobe nodule noted on the prior chest radiograph and CT is not defined radiographically. There is linear and reticular type opacity in the left upper lobe most suggestive of scarring, which appears increased from prior radiograph. No convincing pneumonia or pulmonary edema. Pleural effusion or pneumothorax. Skeletal structures are grossly intact. IMPRESSION: 1. No acute cardiopulmonary disease. 2. Subcentimeter nodule suggested in the right lateral lung base. Recommend follow-up chest CT. Patient had a suspicious nodule in the left upper lobe on the prior chest CT. It is unclear whether this underwent further diagnostic testing or therapy. It is not visualized on the current exam. 3. Findings consistent with interstitial lung disease similar to the prior radiograph and CT. Electronically Signed   By: Amie Portland M.D.   On: 11/12/2019 11:21        Scheduled Meds:  acidophilus  1 capsule Oral Daily   apixaban  2.5 mg Oral BID    carvedilol  3.125 mg Oral BID   Chlorhexidine Gluconate Cloth  6 each Topical Daily   dextromethorphan-guaiFENesin  1 tablet Oral BID   feeding supplement (ENSURE ENLIVE)  237 mL Oral BID BM   fluticasone furoate-vilanterol  1 puff Inhalation Daily   ipratropium  2.5 mL Inhalation Q4H   iron polysaccharides  150 mg Oral Daily   simvastatin  40 mg Oral q1800   vancomycin variable dose per unstable renal function (pharmacist dosing)   Does not apply See admin instructions   vitamin B-12  1,000 mcg Oral Daily   Continuous  Infusions:  sodium chloride 75 mL/hr at 11/13/19 0517   sodium chloride 100 mL/hr at 11/13/19 1256   cefTRIAXone (ROCEPHIN)  IV 1 g (11/13/19 1345)     LOS: 0 days    Time spent: 25 minutes    Alberteen Sam, MD [facility] 11/13/2019, 7:00 PM     Please page though AMION or Epic secure chat:  For [employer], Higher education careers adviser

## 2019-11-14 DIAGNOSIS — I5022 Chronic systolic (congestive) heart failure: Secondary | ICD-10-CM

## 2019-11-14 LAB — COMPREHENSIVE METABOLIC PANEL
ALT: 11 U/L (ref 0–44)
AST: 20 U/L (ref 15–41)
Albumin: 2.2 g/dL — ABNORMAL LOW (ref 3.5–5.0)
Alkaline Phosphatase: 46 U/L (ref 38–126)
Anion gap: 5 (ref 5–15)
BUN: 48 mg/dL — ABNORMAL HIGH (ref 8–23)
CO2: 21 mmol/L — ABNORMAL LOW (ref 22–32)
Calcium: 8 mg/dL — ABNORMAL LOW (ref 8.9–10.3)
Chloride: 115 mmol/L — ABNORMAL HIGH (ref 98–111)
Creatinine, Ser: 1.21 mg/dL (ref 0.61–1.24)
GFR calc Af Amer: >60 mL/min (ref >60)
GFR calc non Af Amer: 52 mL/min — ABNORMAL LOW (ref >60)
Glucose, Bld: 92 mg/dL (ref 70–99)
Potassium: 3.7 mmol/L (ref 3.5–5.1)
Sodium: 141 mmol/L (ref 135–145)
Total Bilirubin: 0.7 mg/dL (ref 0.3–1.2)
Total Protein: 6.8 g/dL (ref 6.5–8.1)

## 2019-11-14 LAB — CBC
HCT: 28.1 % — ABNORMAL LOW (ref 39.0–52.0)
Hemoglobin: 8.2 g/dL — ABNORMAL LOW (ref 13.0–17.0)
MCH: 25.5 pg — ABNORMAL LOW (ref 26.0–34.0)
MCHC: 29.2 g/dL — ABNORMAL LOW (ref 30.0–36.0)
MCV: 87.3 fL (ref 80.0–100.0)
Platelets: 221 10*3/uL (ref 150–400)
RBC: 3.22 MIL/uL — ABNORMAL LOW (ref 4.22–5.81)
RDW: 16 % — ABNORMAL HIGH (ref 11.5–15.5)
WBC: 10.9 10*3/uL — ABNORMAL HIGH (ref 4.0–10.5)
nRBC: 0 % (ref 0.0–0.2)

## 2019-11-14 LAB — VANCOMYCIN, RANDOM: Vancomycin Rm: 8

## 2019-11-14 MED ORDER — VANCOMYCIN HCL 1250 MG/250ML IV SOLN
1250.0000 mg | INTRAVENOUS | Status: DC
  Administered 2019-11-14: 1250 mg via INTRAVENOUS
  Filled 2019-11-14: qty 250

## 2019-11-14 MED ORDER — IPRATROPIUM BROMIDE 0.02 % IN SOLN
2.5000 mL | Freq: Three times a day (TID) | RESPIRATORY_TRACT | Status: DC
  Administered 2019-11-14 (×2): 0.5 mg via RESPIRATORY_TRACT
  Filled 2019-11-14 (×2): qty 2.5

## 2019-11-14 MED ORDER — ENSURE ENLIVE PO LIQD: 237.0000 mL | Freq: Two times a day (BID) | ORAL | 12 refills | Status: DC

## 2019-11-14 NOTE — Progress Notes (Signed)
Pharmacy Antibiotic Note  Allen Potter is a 86 y.o. male admitted on 11/12/2019. Per consult, patient with h/o urine cultures positive for MRSA. Pharmacy has been consulted for vancomycin dosing.  1/13 Vancomycin 1500mg  IV x 1 dose @1600   Scr: 1.75>> 1.32 > 1.21 - aki resolving  -1/15  Random Vanc level returned as 8.0  Plan: Vancomycin 1250 mg IV Q 48 hrs. Goal AUC 400-550. Expected AUC: 495.5 SCr used: 1.21  Will continue to f/u renal function to determine dosing.   Will continue to follow urine cultures.  Height: 5\' 9"  (175.3 cm) Weight: 123 lb 14.4 oz (56.2 kg) IBW/kg (Calculated) : 70.7  Temp (24hrs), Avg:97.9 F (36.6 C), Min:97.6 F (36.4 C), Max:98.1 F (36.7 C)  Recent Labs  Lab 11/12/19 1105 11/12/19 1324 11/13/19 0449 11/14/19 0750  WBC 14.9*  --  14.6* 10.9*  CREATININE 1.75*  --  1.32* 1.21  LATICACIDVEN 2.0* 1.2  --   --   VANCORANDOM  --   --   --  8    Estimated Creatinine Clearance: 32.3 mL/min (by C-G formula based on SCr of 1.21 mg/dL).    No Known Allergies  Antimicrobials this admission: Vancomycin 1/13 >>  Ceftriaxone 1/13 >>   Dose adjustments this admission: NA  Microbiology results: 1/13 BCx: 2 of 4 GPC  (from 1 set) 1/13 UCx: multiple species - new UCx ordered  Thank you for allowing pharmacy to be a part of this patient's care.  2/13, PharmD, BCPS Clinical Pharmacist 11/14/2019 10:55 AM

## 2019-11-14 NOTE — Progress Notes (Signed)
Pt ready for discharge to SNF per MD. Repeat urine culture was unable to be obtained after 2 attempts due to pt's previous penile surgery and his current anatomy- Dr. Maryfrances Bunnell notified of this as well as pt is still on 2L [place].  Report called to [facility] at [facility]; all questions answered and discharge instructions reviewed. EMS transportation set up for pt by RN. PIV removed, VSS. Pt belongings packed.   Shelbie Ammons

## 2019-11-14 NOTE — TOC Transition Note (Signed)
Transition of Care [facility]) - CM/SW Discharge Note   Patient Details  Name: Allen Potter MRN: [id number] Date of Birth: 19-Sep-1927  Transition of Care [facility]) CM/SW Contact:  Barrie Dunker, RN Phone Number: 11/14/2019, 2:42 PM   Clinical Narrative:     [facility] to [facility] room 809 today with EMS transport, the bed side nurse to call report to the main number, The bedside nurse to call EMS for [facility] when ready  [name] his wife was made aware by phone call and provided the number and room  Final next level of care: Skilled Nursing Facility Barriers to Discharge: Barriers Resolved   Patient Goals and CMS Choice Patient states their goals for this hospitalization and ongoing recovery are:: Wants to get better and go hme eventually with wife.      Discharge Placement              Patient chooses bed at: [facility] [place] Patient to be transferred to facility by: EMS Name of family member notified: Doris Patient and family notified of of transfer: 11/14/19  Discharge Plan and Services In-house Referral: Clinical Social Work                                   Social Determinants of Health (SDOH) Interventions     Readmission Risk Interventions Readmission Risk Prevention Plan 06/15/2018  Transportation Screening Complete  PCP or Specialist Appt within 5-7 Days Complete  Home Care Screening Complete  Medication Review (RN CM) Complete  Some recent data might be hidden

## 2019-11-14 NOTE — Progress Notes (Signed)
Pt has a score of 7 on the RT protocol assessment. The nebulizer treatments will be changed to TID.

## 2019-11-14 NOTE — Discharge Summary (Signed)
Physician Discharge Summary  Allen Potter [id number] DOB: January 23, 1927 DOA: 11/12/2019  PCP: Barbette Reichmann, MD  Admit date: 11/12/2019 Discharge date: 11/14/2019  Admitted From: Home  Disposition:  [facility] for rehab   Recommendations for Outpatient Follow-up:  1. Follow up with PCP in 1-2 weeks after discharge from SNF 2. Please obtain BMP in 1 week and restart lisinopril if able      Home Health: N/A  Equipment/Devices: TBD at SNF  Discharge Condition: Fair  CODE STATUS: FULL Diet recommendation: Cardiac  Brief/Interim Summary: Mr. Waddell is a 86 y.o. M with mild dementia, home dwelling, COPD not on O2, sCHF EF 40%, CKD stage IIIa, IDA, DVT on Eliquis, and remote stroke who presented with generalized weakness for several days and then fall.  In the ER, patient found to have WBC 14.9, pyuria, lactic acid 2.0, BUN 132, bump in creatinine, temp 99.1, clear chest x-ray.  He was started on empiric antibiotics and the hospitalist service were asked to evaluate for admission.            PRINCIPAL HOSPITAL DIAGNOSIS: Acute kidney injury from dehydration   Discharge Diagnoses:   Dehydration AKI on CKD stage IIIa Baseline creatinine 1.1, patient's creatinine 1.7 on admission, likely prerenal.  With fluids, improved to 1.2 mg/dL.    Hold lisinopril and check BMP in 1 week and restart lisinopril if able.   Sepsis, ruled out Acute hypoxic respiratory failure, ruled out Patient presented with leukocytosis but no fever nor focal signs of infection.  CXR clear, urine with contaminants only, blood culture growing only CoNS in 1/2, likely contaminant.    Treated with vancomycin and ceftriaxone for 3 days, given cultures negative and no focal signs of infection, recommend discontinuing antibiotics at this time to minimize adverse effects of antibiotics.  DVT Continue Eliquis  COPD No active bronchospasm Continue ICS, LABA  Iron deficiency anemia Hgb  stable relative to baseline ~9 g.dL, no clinical bleeding  Continue iron    Moderate protein calorie malnutrition Continue Ensure  Chronic systolic CHF Appeared dehydrated at admission.    Continue carvedilol, simvastatin  Hold lisinopril for 1 week           Discharge Instructions  Discharge Instructions    Diet - low sodium heart healthy   Complete by: As directed    Discharge instructions   Complete by: As directed    Hold lisinopril for 1 week Check BMP on Monday 1/18  Give Ensure twice daily   Increase activity slowly   Complete by: As directed      Allergies as of 11/14/2019   No Known Allergies     Medication List    STOP taking these medications   lisinopril 2.5 MG tablet Commonly known as: ZESTRIL     TAKE these medications   acetaminophen 325 MG tablet Commonly known as: TYLENOL Take 650 mg by mouth every 4 (four) hours as needed for mild pain or fever.   acidophilus Caps capsule Take 1 capsule by mouth daily.   apixaban 2.5 MG Tabs tablet Commonly known as: ELIQUIS Take 2.5 mg by mouth 2 (two) times daily.   Breo Ellipta 100-25 MCG/INH Aepb Generic drug: fluticasone furoate-vilanterol Inhale 1 puff into the lungs daily.   carvedilol 3.125 MG tablet Commonly known as: COREG Take 3.125 mg by mouth 2 (two) times daily.   feeding supplement (ENSURE ENLIVE) Liqd Take 237 mLs by mouth 2 (two) times daily between meals. Start taking on: November 15, 2019  iron polysaccharides 150 MG capsule Commonly known as: NIFEREX Take 150 mg by mouth daily.   simvastatin 40 MG tablet Commonly known as: ZOCOR Take 1 tablet (40 mg total) by mouth daily at 6 PM.   Spiriva HandiHaler 18 MCG inhalation capsule Generic drug: tiotropium Place 1 puff into inhaler and inhale daily.   Ventolin HFA 108 (90 Base) MCG/ACT inhaler Generic drug: albuterol Inhale 2 puffs into the lungs every 6 (six) hours as needed for wheezing or shortness of  breath.   vitamin B-12 1000 MCG tablet Commonly known as: CYANOCOBALAMIN Take 1,000 mcg by mouth daily.       Contact information for follow-up providers    Barbette Reichmann, MD Follow up.   Specialty: Internal Medicine Why: Call Dr. Marcello Fennel for follow up appointment 1 week after discharge from rehab Contact information: [address] [place] [address] [phone]            Contact information for after-discharge care    Destination    [facility] Preferred SNF .   Service: Skilled Nursing Contact information: [address] [place] [address] [phone]                 No Known Allergies  Consultations:     Procedures/Studies: CT Head Wo Contrast  Result Date: 11/12/2019 CLINICAL DATA:  Recent UTI. Weakness, urinary retention and falls. EXAM: CT HEAD WITHOUT CONTRAST TECHNIQUE: Contiguous axial images were obtained from the base of the skull through the vertex without intravenous contrast. COMPARISON:  Brain MRI 05/21/2018 FINDINGS: Brain: No evidence of acute infarction, hemorrhage, hydrocephalus, extra-axial collection or mass lesion/mass effect. There is mild diffuse low-attenuation within the subcortical and periventricular white matter compatible with chronic microvascular disease. Prominence of the CSF spaces noted compatible with brain atrophy. Vascular: No hyperdense vessel or unexpected calcification. Skull: Normal. Negative for fracture or focal lesion. Sinuses/Orbits: Asymmetric partial opacification of the right mastoid air cells. The remaining mastoid air cells and paranasal sinuses appear clear. Other: None. IMPRESSION: 1. No acute intracranial abnormalities. 2. Chronic small vessel ischemic change and brain atrophy. 3. Asymmetric partial opacification of the right mastoid air cells. Electronically Signed   By: Signa Kell M.D.   On: 11/12/2019 11:38   DG Chest Port 1 View  Result  Date: 11/12/2019 CLINICAL DATA:  Hypoxia per ordering notes. Patient is from home with recent uti and now weakness, urinary retention and falls. Hx of COPD. Former smoker. EXAM: PORTABLE CHEST 1 VIEW COMPARISON:  Chest radiograph, 06/26/2018 and prior exams. Prior chest CT, 06/26/2018. FINDINGS: Cardiac silhouette is normal in size. Small to moderate hiatal hernia. Mediastinal or hilar masses. Bilateral irregular interstitial thickening with a lower lung zone predominance, similar to the prior exam. There is a subcentimeter nodular opacity at the right lateral lung base, not evident on the prior study. The left upper lobe nodule noted on the prior chest radiograph and CT is not defined radiographically. There is linear and reticular type opacity in the left upper lobe most suggestive of scarring, which appears increased from prior radiograph. No convincing pneumonia or pulmonary edema. Pleural effusion or pneumothorax. Skeletal structures are grossly intact. IMPRESSION: 1. No acute cardiopulmonary disease. 2. Subcentimeter nodule suggested in the right lateral lung base. Recommend follow-up chest CT. Patient had a suspicious nodule in the left upper lobe on the prior chest CT. It is unclear whether this underwent further diagnostic testing or therapy. It is not visualized on the  current exam. 3. Findings consistent with interstitial lung disease similar to the prior radiograph and CT. Electronically Signed   By: Amie Portland M.D.   On: 11/12/2019 11:21       Subjective: Feeling tired, but "better".  NO chest pain, headache, dyspnea, cough, sputum, dysuria, urinary frequency or irritation.  No chills, no vomiting.  Discharge Exam: Vitals:   11/14/19 0728 11/14/19 0803  BP: 129/66   Pulse: 67   Resp: 16   Temp: 98 F (36.7 C)   SpO2: 96% 96%   Vitals:   11/14/19 0348 11/14/19 0659 11/14/19 0728 11/14/19 0803  BP:   129/66   Pulse:   67   Resp:   16   Temp:   98 F (36.7 C)   TempSrc:   Oral    SpO2: 97%  96% 96%  Weight:  56.2 kg    Height:        General: Pt is alert, sleeping but easily rousable, not in acute distress, sitting up in recliner Cardiovascular: RRR, nl S1-S2, no murmurs appreciated.   No LE edema.   Respiratory: Normal respiratory rate and rhythm.  CTAB without rales or wheezes. Abdominal: Abdomen soft and non-tender.  No distension or HSM.   Neuro/Psych: Strength symmetric in upper and lower extremities, with generalized weakness.  Judgment and insight appear mildly impaired   The results of significant diagnostics from this hospitalization (including imaging, microbiology, ancillary and laboratory) are listed below for reference.     Microbiology: Recent Results (from the past 240 hour(s))  Blood Culture (routine x 2)     Status: None (Preliminary result)   Collection Time: 11/12/19 11:07 AM   Specimen: BLOOD  Result Value Ref Range Status   Specimen Description BLOOD RIGHT WRIST  Final   Special Requests   Final    BOTTLES DRAWN AEROBIC AND ANAEROBIC Blood Culture adequate volume   Culture   Final    NO GROWTH 2 DAYS Performed at [facility], [address] [place], [place] [address]    Report Status PENDING  Incomplete  Blood Culture (routine x 2)     Status: Abnormal (Preliminary result)   Collection Time: 11/12/19 11:34 AM   Specimen: BLOOD  Result Value Ref Range Status   Specimen Description   Final    BLOOD LEFT ANTECUBITAL Performed at [facility], [address] [place], [place] [address]    Special Requests   Final    BOTTLES DRAWN AEROBIC AND ANAEROBIC Blood Culture adequate volume Performed at [facility], [address] [place], [place] [address]    Culture  Setup Time   Final    GRAM POSITIVE COCCI IN BOTH AEROBIC AND ANAEROBIC BOTTLES CRITICAL RESULT CALLED TO, READ BACK BY AND VERIFIED WITH: DAVID BESANTI AT 0256 ON 11/13/19 RWW Performed at [facility], [address]  [place], [place] [address]    Culture (A)  Final    STAPHYLOCOCCUS SPECIES (COAGULASE NEGATIVE) THE SIGNIFICANCE OF ISOLATING THIS ORGANISM FROM A SINGLE SET OF BLOOD CULTURES WHEN MULTIPLE SETS ARE DRAWN IS UNCERTAIN. PLEASE NOTIFY THE MICROBIOLOGY DEPARTMENT WITHIN ONE WEEK IF SPECIATION AND SENSITIVITIES ARE REQUIRED. Performed at [facility], [address], [place], [place] [address]    Report Status PENDING  Incomplete  Respiratory Panel by RT PCR (Flu A&B, Covid) - Nasopharyngeal Swab     Status: None   Collection Time: 11/12/19 11:35 AM   Specimen: Nasopharyngeal Swab  Result Value  Ref Range Status   SARS Coronavirus 2 by RT PCR NEGATIVE NEGATIVE Final    Comment: (NOTE) SARS-CoV-2 target nucleic acids are NOT DETECTED. The SARS-CoV-2 RNA is generally detectable in upper respiratoy specimens during the acute phase of infection. The lowest concentration of SARS-CoV-2 viral copies this assay can detect is 131 copies/mL. A negative result does not preclude SARS-Cov-2 infection and should not be used as the sole basis for treatment or other patient management decisions. A negative result may occur with  improper specimen collection/handling, submission of specimen other than nasopharyngeal swab, presence of viral mutation(s) within the areas targeted by this assay, and inadequate number of viral copies (<131 copies/mL). A negative result must be combined with clinical observations, patient history, and epidemiological information. The expected result is Negative. Fact Sheet for Patients:  PinkCheek.be Fact Sheet for Healthcare Providers:  GravelBags.it This test is not yet ap proved or cleared by the [place] FDA and  has been authorized for detection and/or diagnosis of SARS-CoV-2 by FDA under an Emergency Use Authorization (EUA). This EUA will remain  in effect (meaning this test can be used) for the duration of  the COVID-19 declaration under Section 564(b)(1) of the Act, 21 U.S.C. section 360bbb-3(b)(1), unless the authorization is terminated or revoked sooner.    Influenza A by PCR NEGATIVE NEGATIVE Final   Influenza B by PCR NEGATIVE NEGATIVE Final    Comment: (NOTE) The Xpert Xpress SARS-CoV-2/FLU/RSV assay is intended as an aid in  the diagnosis of influenza from Nasopharyngeal swab specimens and  should not be used as a sole basis for treatment. Nasal washings and  aspirates are unacceptable for Xpert Xpress SARS-CoV-2/FLU/RSV  testing. Fact Sheet for Patients: PinkCheek.be Fact Sheet for Healthcare Providers: GravelBags.it This test is not yet approved or cleared by the [place] FDA and  has been authorized for detection and/or diagnosis of SARS-CoV-2 by  FDA under an Emergency Use Authorization (EUA). This EUA will remain  in effect (meaning this test can be used) for the duration of the  Covid-19 declaration under Section 564(b)(1) of the Act, 21  U.S.C. section 360bbb-3(b)(1), unless the authorization is  terminated or revoked. Performed at [facility], [address] [place], [place] [address]   Urine culture     Status: Abnormal   Collection Time: 11/12/19  1:05 PM   Specimen: In/Out Cath Urine  Result Value Ref Range Status   Specimen Description   Final    IN/OUT CATH URINE Performed at [facility], [address] [place], [place] [address]    Special Requests   Final    NONE Performed at [facility], [address] [place], [place] [address]    Culture MULTIPLE SPECIES PRESENT, SUGGEST RECOLLECTION (A)  Final   Report Status 11/13/2019 FINAL  Final     Labs: BNP (last 3 results) Recent Labs    11/12/19 1107  BNP 213.0*   Basic Metabolic Panel: Recent Labs  Lab 11/12/19 1105 11/13/19 0449 11/14/19 0750  NA 139 142 141  K 4.2 4.0 3.7  CL 104 111 115*  CO2  25 22 21*  GLUCOSE 106* 93 92  BUN 59* 55* 48*  CREATININE 1.75* 1.32* 1.21  CALCIUM 8.9 8.4* 8.0*   Liver Function Tests: Recent Labs  Lab 11/12/19 1105 11/14/19 0750  AST 17 20  ALT 13 11  ALKPHOS 55 46  BILITOT 0.7 0.7  PROT 8.6* 6.8  ALBUMIN 2.9* 2.2*   No results  for input(s): LIPASE, AMYLASE in the last 168 hours. No results for input(s): AMMONIA in the last 168 hours. CBC: Recent Labs  Lab 11/12/19 1105 11/13/19 0449 11/14/19 0750  WBC 14.9* 14.6* 10.9*  NEUTROABS 12.1*  --   --   HGB 10.1* 9.7* 8.2*  HCT 33.8* 32.1* 28.1*  MCV 85.1 86.3 87.3  PLT 306 266 221   Cardiac Enzymes: No results for input(s): CKTOTAL, CKMB, CKMBINDEX, TROPONINI in the last 168 hours. BNP: Invalid input(s): POCBNP CBG: Recent Labs  Lab 11/13/19 2123  GLUCAP 94   D-Dimer No results for input(s): DDIMER in the last 72 hours. Hgb A1c No results for input(s): HGBA1C in the last 72 hours. Lipid Profile No results for input(s): CHOL, HDL, LDLCALC, TRIG, CHOLHDL, LDLDIRECT in the last 72 hours. Thyroid function studies No results for input(s): TSH, T4TOTAL, T3FREE, THYROIDAB in the last 72 hours.  Invalid input(s): FREET3 Anemia work up No results for input(s): VITAMINB12, FOLATE, FERRITIN, TIBC, IRON, RETICCTPCT in the last 72 hours. Urinalysis    Component Value Date/Time   COLORURINE YELLOW (A) 11/12/2019 1305   APPEARANCEUR CLOUDY (A) 11/12/2019 1305   APPEARANCEUR Cloudy (A) 12/03/2018 1113   LABSPEC 1.020 11/12/2019 1305   PHURINE 5.0 11/12/2019 1305   GLUCOSEU NEGATIVE 11/12/2019 1305   HGBUR LARGE (A) 11/12/2019 1305   BILIRUBINUR NEGATIVE 11/12/2019 1305   BILIRUBINUR Negative 12/03/2018 1113   KETONESUR NEGATIVE 11/12/2019 1305   PROTEINUR 100 (A) 11/12/2019 1305   NITRITE NEGATIVE 11/12/2019 1305   LEUKOCYTESUR TRACE (A) 11/12/2019 1305   Sepsis Labs Invalid input(s): PROCALCITONIN,  WBC,  LACTICIDVEN Microbiology Recent Results (from the past 240 hour(s))   Blood Culture (routine x 2)     Status: None (Preliminary result)   Collection Time: 11/12/19 11:07 AM   Specimen: BLOOD  Result Value Ref Range Status   Specimen Description BLOOD RIGHT WRIST  Final   Special Requests   Final    BOTTLES DRAWN AEROBIC AND ANAEROBIC Blood Culture adequate volume   Culture   Final    NO GROWTH 2 DAYS Performed at [facility], [address], [place], [place] [address]    Report Status PENDING  Incomplete  Blood Culture (routine x 2)     Status: Abnormal (Preliminary result)   Collection Time: 11/12/19 11:34 AM   Specimen: BLOOD  Result Value Ref Range Status   Specimen Description   Final    BLOOD LEFT ANTECUBITAL Performed at [facility], [address] [place], [place] [address]    Special Requests   Final    BOTTLES DRAWN AEROBIC AND ANAEROBIC Blood Culture adequate volume Performed at [facility], [address] [place], [place] [address]    Culture  Setup Time   Final    GRAM POSITIVE COCCI IN BOTH AEROBIC AND ANAEROBIC BOTTLES CRITICAL RESULT CALLED TO, READ BACK BY AND VERIFIED WITH: DAVID BESANTI AT 0256 ON 11/13/19 RWW Performed at [facility], [address] [place], [place] [address]    Culture (A)  Final    STAPHYLOCOCCUS SPECIES (COAGULASE NEGATIVE) THE SIGNIFICANCE OF ISOLATING THIS ORGANISM FROM A SINGLE SET OF BLOOD CULTURES WHEN MULTIPLE SETS ARE DRAWN IS UNCERTAIN. PLEASE NOTIFY THE MICROBIOLOGY DEPARTMENT WITHIN ONE WEEK IF SPECIATION AND SENSITIVITIES ARE REQUIRED. Performed at [facility], [address] [place], [place] [address]    Report Status PENDING  Incomplete  Respiratory Panel by RT PCR (Flu A&B, Covid) - Nasopharyngeal Swab  Status: None   Collection Time: 11/12/19 11:35 AM   Specimen: Nasopharyngeal Swab  Result Value Ref Range Status   SARS Coronavirus 2 by RT PCR NEGATIVE NEGATIVE Final    Comment: (NOTE) SARS-CoV-2 target nucleic acids are NOT  DETECTED. The SARS-CoV-2 RNA is generally detectable in upper respiratoy specimens during the acute phase of infection. The lowest concentration of SARS-CoV-2 viral copies this assay can detect is 131 copies/mL. A negative result does not preclude SARS-Cov-2 infection and should not be used as the sole basis for treatment or other patient management decisions. A negative result may occur with  improper specimen collection/handling, submission of specimen other than nasopharyngeal swab, presence of viral mutation(s) within the areas targeted by this assay, and inadequate number of viral copies (<131 copies/mL). A negative result must be combined with clinical observations, patient history, and epidemiological information. The expected result is Negative. Fact Sheet for Patients:  https://www.moore.com/ Fact Sheet for Healthcare Providers:  https://www.young.biz/ This test is not yet ap proved or cleared by the [place] FDA and  has been authorized for detection and/or diagnosis of SARS-CoV-2 by FDA under an Emergency Use Authorization (EUA). This EUA will remain  in effect (meaning this test can be used) for the duration of the COVID-19 declaration under Section 564(b)(1) of the Act, 21 U.S.C. section 360bbb-3(b)(1), unless the authorization is terminated or revoked sooner.    Influenza A by PCR NEGATIVE NEGATIVE Final   Influenza B by PCR NEGATIVE NEGATIVE Final    Comment: (NOTE) The Xpert Xpress SARS-CoV-2/FLU/RSV assay is intended as an aid in  the diagnosis of influenza from Nasopharyngeal swab specimens and  should not be used as a sole basis for treatment. Nasal washings and  aspirates are unacceptable for Xpert Xpress SARS-CoV-2/FLU/RSV  testing. Fact Sheet for Patients: https://www.moore.com/ Fact Sheet for Healthcare Providers: https://www.young.biz/ This test is not yet approved or cleared  by the [place] FDA and  has been authorized for detection and/or diagnosis of SARS-CoV-2 by  FDA under an Emergency Use Authorization (EUA). This EUA will remain  in effect (meaning this test can be used) for the duration of the  Covid-19 declaration under Section 564(b)(1) of the Act, 21  U.S.C. section 360bbb-3(b)(1), unless the authorization is  terminated or revoked. Performed at [facility], [address], [place], [place] [address]   Urine culture     Status: Abnormal   Collection Time: 11/12/19  1:05 PM   Specimen: In/Out Cath Urine  Result Value Ref Range Status   Specimen Description   Final    IN/OUT CATH URINE Performed at [facility], [address], [place], [place] [address]    Special Requests   Final    NONE Performed at [facility], [address] [place], [place] [address]    Culture MULTIPLE SPECIES PRESENT, SUGGEST RECOLLECTION (A)  Final   Report Status 11/13/2019 FINAL  Final     Time coordinating discharge: 25 minutes The [place] controlled substances registry was reviewed for this patient       SIGNED:   Alberteen Sam, MD  [facility] 11/14/2019, 2:45 PM

## 2019-11-15 LAB — CULTURE, BLOOD (ROUTINE X 2): Special Requests: ADEQUATE

## 2019-11-17 LAB — CULTURE, BLOOD (ROUTINE X 2)
Culture: NO GROWTH
Special Requests: ADEQUATE

## 2019-11-24 ENCOUNTER — Inpatient Hospital Stay
Admission: EM | Admit: 2019-11-24 | Discharge: 2019-11-25 | DRG: 872 | Disposition: A | Discharge status: Hospice | Payer: Medicare Other | Attending: Internal Medicine | Admitting: Internal Medicine

## 2019-11-24 ENCOUNTER — Encounter: Payer: Self-pay | Admitting: Emergency Medicine

## 2019-11-24 ENCOUNTER — Emergency Department: Payer: Medicare Other

## 2019-11-24 ENCOUNTER — Other Ambulatory Visit: Payer: Self-pay

## 2019-11-24 DIAGNOSIS — Z515 Encounter for palliative care: Secondary | ICD-10-CM

## 2019-11-24 DIAGNOSIS — N1832 Chronic kidney disease, stage 3b: Secondary | ICD-10-CM | POA: Diagnosis not present

## 2019-11-24 DIAGNOSIS — K921 Melena: Secondary | ICD-10-CM

## 2019-11-24 DIAGNOSIS — R6520 Severe sepsis without septic shock: Secondary | ICD-10-CM | POA: Diagnosis not present

## 2019-11-24 DIAGNOSIS — Z7189 Other specified counseling: Secondary | ICD-10-CM

## 2019-11-24 DIAGNOSIS — N183 Chronic kidney disease, stage 3 unspecified: Secondary | ICD-10-CM | POA: Diagnosis present

## 2019-11-24 DIAGNOSIS — Z7901 Long term (current) use of anticoagulants: Secondary | ICD-10-CM

## 2019-11-24 DIAGNOSIS — E872 Acidosis, unspecified: Secondary | ICD-10-CM

## 2019-11-24 DIAGNOSIS — Z87891 Personal history of nicotine dependence: Secondary | ICD-10-CM

## 2019-11-24 DIAGNOSIS — R64 Cachexia: Secondary | ICD-10-CM | POA: Diagnosis present

## 2019-11-24 DIAGNOSIS — A419 Sepsis, unspecified organism: Principal | ICD-10-CM | POA: Diagnosis present

## 2019-11-24 DIAGNOSIS — F0390 Unspecified dementia without behavioral disturbance: Secondary | ICD-10-CM | POA: Diagnosis present

## 2019-11-24 DIAGNOSIS — J449 Chronic obstructive pulmonary disease, unspecified: Secondary | ICD-10-CM | POA: Diagnosis present

## 2019-11-24 DIAGNOSIS — Z20822 Contact with and (suspected) exposure to covid-19: Secondary | ICD-10-CM | POA: Diagnosis present

## 2019-11-24 DIAGNOSIS — R531 Weakness: Secondary | ICD-10-CM

## 2019-11-24 DIAGNOSIS — R1030 Lower abdominal pain, unspecified: Secondary | ICD-10-CM | POA: Diagnosis present

## 2019-11-24 DIAGNOSIS — I959 Hypotension, unspecified: Secondary | ICD-10-CM | POA: Diagnosis present

## 2019-11-24 DIAGNOSIS — D72829 Elevated white blood cell count, unspecified: Secondary | ICD-10-CM | POA: Diagnosis not present

## 2019-11-24 DIAGNOSIS — Z79899 Other long term (current) drug therapy: Secondary | ICD-10-CM

## 2019-11-24 DIAGNOSIS — E785 Hyperlipidemia, unspecified: Secondary | ICD-10-CM | POA: Diagnosis present

## 2019-11-24 DIAGNOSIS — G2581 Restless legs syndrome: Secondary | ICD-10-CM | POA: Diagnosis present

## 2019-11-24 DIAGNOSIS — Z8673 Personal history of transient ischemic attack (TIA), and cerebral infarction without residual deficits: Secondary | ICD-10-CM

## 2019-11-24 DIAGNOSIS — Z86718 Personal history of other venous thrombosis and embolism: Secondary | ICD-10-CM | POA: Diagnosis not present

## 2019-11-24 DIAGNOSIS — R6510 Systemic inflammatory response syndrome (SIRS) of non-infectious origin without acute organ dysfunction: Secondary | ICD-10-CM | POA: Diagnosis not present

## 2019-11-24 DIAGNOSIS — Z87440 Personal history of urinary (tract) infections: Secondary | ICD-10-CM | POA: Diagnosis not present

## 2019-11-24 DIAGNOSIS — Z66 Do not resuscitate: Secondary | ICD-10-CM | POA: Diagnosis present

## 2019-11-24 DIAGNOSIS — E860 Dehydration: Secondary | ICD-10-CM | POA: Diagnosis present

## 2019-11-24 DIAGNOSIS — Z681 Body mass index (BMI) 19 or less, adult: Secondary | ICD-10-CM | POA: Diagnosis not present

## 2019-11-24 DIAGNOSIS — N179 Acute kidney failure, unspecified: Secondary | ICD-10-CM

## 2019-11-24 DIAGNOSIS — R197 Diarrhea, unspecified: Secondary | ICD-10-CM | POA: Diagnosis not present

## 2019-11-24 DIAGNOSIS — E440 Moderate protein-calorie malnutrition: Secondary | ICD-10-CM | POA: Diagnosis present

## 2019-11-24 DIAGNOSIS — I5022 Chronic systolic (congestive) heart failure: Secondary | ICD-10-CM | POA: Diagnosis not present

## 2019-11-24 DIAGNOSIS — Z9981 Dependence on supplemental oxygen: Secondary | ICD-10-CM

## 2019-11-24 DIAGNOSIS — Z7951 Long term (current) use of inhaled steroids: Secondary | ICD-10-CM

## 2019-11-24 LAB — RESPIRATORY PANEL BY RT PCR (FLU A&B, COVID)
Influenza A by PCR: NEGATIVE
Influenza B by PCR: NEGATIVE
SARS Coronavirus 2 by RT PCR: NEGATIVE

## 2019-11-24 LAB — CBC WITH DIFFERENTIAL/PLATELET
Abs Immature Granulocytes: 1.01 10*3/uL — ABNORMAL HIGH (ref 0.00–0.07)
Basophils Absolute: 0 10*3/uL (ref 0.0–0.1)
Basophils Relative: 0 %
Eosinophils Absolute: 0 10*3/uL (ref 0.0–0.5)
Eosinophils Relative: 0 %
HCT: 38.3 % — ABNORMAL LOW (ref 39.0–52.0)
Hemoglobin: 10.2 g/dL — ABNORMAL LOW (ref 13.0–17.0)
Immature Granulocytes: 3 %
Lymphocytes Relative: 4 %
Lymphs Abs: 1.2 10*3/uL (ref 0.7–4.0)
MCH: 25.6 pg — ABNORMAL LOW (ref 26.0–34.0)
MCHC: 26.6 g/dL — ABNORMAL LOW (ref 30.0–36.0)
MCV: 96.2 fL (ref 80.0–100.0)
Monocytes Absolute: 1.8 10*3/uL — ABNORMAL HIGH (ref 0.1–1.0)
Monocytes Relative: 6 %
Neutro Abs: 27.6 10*3/uL — ABNORMAL HIGH (ref 1.7–7.7)
Neutrophils Relative %: 87 %
Platelets: 224 10*3/uL (ref 150–400)
RBC: 3.98 MIL/uL — ABNORMAL LOW (ref 4.22–5.81)
RDW: 17.2 % — ABNORMAL HIGH (ref 11.5–15.5)
Smear Review: NORMAL
WBC: 31.6 10*3/uL — ABNORMAL HIGH (ref 4.0–10.5)
nRBC: 0.3 % — ABNORMAL HIGH (ref 0.0–0.2)

## 2019-11-24 LAB — COMPREHENSIVE METABOLIC PANEL
ALT: 8 U/L (ref 0–44)
AST: 18 U/L (ref 15–41)
Albumin: <1 g/dL — ABNORMAL LOW (ref 3.5–5.0)
Alkaline Phosphatase: 52 U/L (ref 38–126)
Anion gap: 16 — ABNORMAL HIGH (ref 5–15)
BUN: 57 mg/dL — ABNORMAL HIGH (ref 8–23)
CO2: 12 mmol/L — ABNORMAL LOW (ref 22–32)
Calcium: 7 mg/dL — ABNORMAL LOW (ref 8.9–10.3)
Chloride: 114 mmol/L — ABNORMAL HIGH (ref 98–111)
Creatinine, Ser: 1.69 mg/dL — ABNORMAL HIGH (ref 0.61–1.24)
GFR calc Af Amer: 41 mL/min — ABNORMAL LOW (ref >60)
GFR calc non Af Amer: 35 mL/min — ABNORMAL LOW (ref >60)
Glucose, Bld: 59 mg/dL — ABNORMAL LOW (ref 70–99)
Potassium: 4.7 mmol/L (ref 3.5–5.1)
Sodium: 142 mmol/L (ref 135–145)
Total Bilirubin: 0.3 mg/dL (ref 0.3–1.2)
Total Protein: <3 g/dL — ABNORMAL LOW (ref 6.5–8.1)

## 2019-11-24 LAB — TROPONIN I (HIGH SENSITIVITY): Troponin I (High Sensitivity): 17 ng/L (ref <18)

## 2019-11-24 LAB — LACTIC ACID, PLASMA: Lactic Acid, Venous: 7.4 mmol/L (ref 0.5–1.9)

## 2019-11-24 MED ORDER — LACTATED RINGERS IV BOLUS (SEPSIS)
1000.0000 mL | Freq: Once | INTRAVENOUS | Status: AC
  Administered 2019-11-24: 21:00:00 1000 mL via INTRAVENOUS

## 2019-11-24 MED ORDER — PIPERACILLIN-TAZOBACTAM 3.375 G IVPB 30 MIN: 3.3750 g | Freq: Three times a day (TID) | INTRAVENOUS | Status: DC

## 2019-11-24 MED ORDER — VANCOMYCIN HCL 500 MG/100ML IV SOLN
500.0000 mg | Freq: Once | INTRAVENOUS | Status: AC
  Administered 2019-11-25: 02:00:00 500 mg via INTRAVENOUS
  Filled 2019-11-24: qty 100

## 2019-11-24 MED ORDER — VANCOMYCIN HCL IN DEXTROSE 1-5 GM/200ML-% IV SOLN
1000.0000 mg | Freq: Once | INTRAVENOUS | Status: AC
  Administered 2019-11-24: 23:00:00 1000 mg via INTRAVENOUS
  Filled 2019-11-24: qty 200

## 2019-11-24 MED ORDER — PIPERACILLIN-TAZOBACTAM 3.375 G IVPB
3.3750 g | Freq: Three times a day (TID) | INTRAVENOUS | Status: DC
  Administered 2019-11-25: 3.375 g via INTRAVENOUS

## 2019-11-24 MED ORDER — PIPERACILLIN-TAZOBACTAM 3.375 G IVPB 30 MIN
3.3750 g | Freq: Once | INTRAVENOUS | Status: AC
  Administered 2019-11-24: 23:00:00 3.375 g via INTRAVENOUS
  Filled 2019-11-24: qty 50

## 2019-11-24 NOTE — ED Triage Notes (Signed)
Pt presents form [facility] via [facility] with c/o abnormal labs. BUN 49.6 on facility lab work. Pt has MOST form at bedside. According to facility, wife of pt wanted pt sent to the hospital. Blood sugar 204. Pt on 2L chronically for COPD/CHF. Pt alert to name at this time.

## 2019-11-24 NOTE — ED Notes (Signed)
Pt wife, cell phone number is, [phone]

## 2019-11-24 NOTE — ED Provider Notes (Addendum)
[facility] Emergency Department Provider Note       Time seen: ----------------------------------------- 8:27 PM on 11/24/2019 ----------------------------------------- Level V caveat: History/ROS limited by altered mental status  I have reviewed the triage vital signs and the nursing notes.  HISTORY  Chief Complaint Abnormal Lab   HPI Allen Potter is a 86 y.o. male with a history of anemia, COPD, DVT, hyperlipidemia, CVA, UTI, dementia, for care who presents to the ED for possible dehydration.  [facility] with abnormal lab work revealing increased BUN and creatinine compared to prior.  Patient is alert to his name only at this time.  Past Medical History:  Diagnosis Date  . Anemia    iron deficiency  . COPD (chronic obstructive pulmonary disease) ([place])   . DVT (deep venous thrombosis) ([place]) 01/2018  . Hyperlipidemia   . Restless leg   . Stroke ([place]) 03/2018  . UTI (urinary tract infection)     Patient Active Problem List   Diagnosis Date Noted  . Iron deficiency anemia 11/12/2019  . Protein-calorie malnutrition, moderate ([place]) 11/12/2019  . Chronic systolic CHF (congestive heart failure) ([place]) 11/12/2019  . UTI (urinary tract infection)   . Fall   . Acute renal failure superimposed on stage 3a chronic kidney disease ([place])   . Generalized weakness   . 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 CKD (chronic kidney disease) stage 3, GFR 30-59 ml/min 08/14/2018  . Urinary retention 07/25/2018  . Moderate mitral insufficiency 07/17/2018  . Abnormal ECG 07/03/2018  . Cardiomyopathy, nonischemic ([place]) 07/03/2018  . History of DVT of lower extremity 06/17/2018  . Renal impairment 06/17/2018  . Sepsis ([place]) 06/12/2018  . Acute deep vein thrombosis (DVT) of popliteal vein (HCC) 06/07/2018  . Asymptomatic microscopic hematuria 06/07/2018  . Cardiac syncope 06/07/2018  . Hyperlipidemia, mixed 06/07/2018   . Dysarthria 03/18/2018  . Poor balance 03/18/2018  . Status post CVA 03/18/2018  . BPH associated with nocturia 11/16/2017  . COPD, moderate ([place]) 07/02/2014    Past Surgical History:  Procedure Laterality Date  . CATARACT EXTRACTION Bilateral 2015  . CYSTOSCOPY WITH BIOPSY N/A 12/13/2018   Procedure: CYSTOSCOPY WITH BLADDER BIOPSY;  Surgeon: Billey Co, MD;  Location: [facility] ORS;  Service: Urology;  Laterality: N/A;  . TRANSURETHRAL RESECTION OF PROSTATE N/A 12/13/2018   Procedure: TRANSURETHRAL RESECTION OF THE PROSTATE (TURP);  Surgeon: Billey Co, MD;  Location: [facility] ORS;  Service: Urology;  Laterality: N/A;    Allergies Patient has no known allergies.  Social History Social History   Tobacco Use  . Smoking status: Former Smoker    Packs/day: 0.25    Types: Cigarettes    Quit date: 2000    Years since quitting: 21.0  . Smokeless tobacco: Never Used  Substance Use Topics  . Alcohol use: Not Currently  . Drug use: Never    Review of Systems Patient cannot give further review of systems or report.  All systems negative/normal/unremarkable except as stated in the HPI  ____________________________________________   PHYSICAL EXAM:  VITAL SIGNS: ED Triage Vitals  Enc Vitals Group     BP      Pulse      Resp      Temp      Temp src      SpO2      Weight      Height      Head Circumference      [facility]  Pain Score      Pain Loc      Pain Edu?      Excl. in GC?     Constitutional: Alert but disoriented, chronically ill-appearing, mild distress Eyes: Conjunctivae are normal. Normal extraocular movements. Cardiovascular: Normal rate, regular rhythm. No murmurs, rubs, or gallops. Respiratory: Normal respiratory effort without tachypnea nor retractions. Breath sounds are clear and equal bilaterally. No wheezes/rales/rhonchi. Gastrointestinal: Soft and nontender. Normal bowel sounds Musculoskeletal: Nontender with normal range of motion in  extremities. No lower extremity tenderness nor edema. Neurologic:  No gross focal neurologic deficits are appreciated.  Generalized weakness, nothing focal Skin: Mottled appearance to his lower extremities Psychiatric: Patient is nonverbal ____________________________________________  EKG: Interpreted by me.  Sinus rhythm with rate of 94 bpm, left bundle branch block, long QT  ____________________________________________  ED COURSE:  As part of my medical decision making, I reviewed the following data within the electronic MEDICAL RECORD NUMBER History obtained from family if available, nursing notes, old chart and ekg, as well as notes from prior ED visits. Patient presented for worsening renal function, we will assess with labs and imaging as indicated at this time.   Procedures  Allen Potter was evaluated in Emergency Department on 11/24/2019 for the symptoms described in the history of present illness. He was evaluated in the context of the global COVID-19 pandemic, which necessitated consideration that the patient might be at risk for infection with the SARS-CoV-2 virus that causes COVID-19. Institutional protocols and algorithms that pertain to the evaluation of patients at risk for COVID-19 are in a state of rapid change based on information released by regulatory bodies including the CDC and federal and state organizations. These policies and algorithms were followed during the patient's care in the ED.  ____________________________________________   LABS (pertinent positives/negatives)  Labs Reviewed  CBC WITH DIFFERENTIAL/PLATELET - Abnormal; Notable for the following components:      Result Value   WBC 31.6 (*)    RBC 3.98 (*)    Hemoglobin 10.2 (*)    HCT 38.3 (*)    MCH 25.6 (*)    MCHC 26.6 (*)    RDW 17.2 (*)    nRBC 0.3 (*)    Neutro Abs 27.6 (*)    Monocytes Absolute 1.8 (*)    Abs Immature Granulocytes 1.01 (*)    All other components within normal limits  LACTIC  ACID, PLASMA - Abnormal; Notable for the following components:   Lactic Acid, Venous 7.4 (*)    All other components within normal limits  CULTURE, BLOOD (ROUTINE X 2)  CULTURE, BLOOD (ROUTINE X 2)  URINE CULTURE  LACTIC ACID, PLASMA  CBC WITH DIFFERENTIAL/PLATELET  URINALYSIS, ROUTINE W REFLEX MICROSCOPIC  COMPREHENSIVE METABOLIC PANEL  TROPONIN I (HIGH SENSITIVITY)   CRITICAL CARE Performed by: Ulice Dash   Total critical care time: 30 minutes  Critical care time was exclusive of separately billable procedures and treating other patients.  Critical care was necessary to treat or prevent imminent or life-threatening deterioration.  Critical care was time spent personally by me on the following activities: development of treatment plan with patient and/or surrogate as well as nursing, discussions with consultants, evaluation of patient's response to treatment, examination of patient, obtaining history from patient or surrogate, ordering and performing treatments and interventions, ordering and review of laboratory studies, ordering and review of radiographic studies, pulse oximetry and re-evaluation of patient's condition.  RADIOLOGY Images were viewed by me  Chest x-ray IMPRESSION:  1. Emphysematous disease. Suspected cavitary process at the left  apex. Recommend chest CT for further evaluation.  2. Lucency at the right apicolateral lung, favor skin fold artifact  over small pneumothorax. This could also be evaluated at chest CT.  ____________________________________________   DIFFERENTIAL DIAGNOSIS   Sepsis, dehydration, renal failure, end-of-life care  FINAL ASSESSMENT AND PLAN  Sepsis, acute kidney injury   Plan: The patient had presented for abnormal lab work likely indicating dehydration.  On his most form he does consent to short-term IV fluids.  Patient's labs likely indicated sepsis. Patient's imaging revealed some nonspecific findings that can be  followed up with on CT.  I discussed with the family who is requesting IV fluids and antibiotics at this time.  We have started him on 2 L of LR and broad-spectrum antibiotics.  I have also put in for a palliative care consult, prognosis is poor.   Ulice Dash, MD    Note: This note was generated in part or whole with voice recognition software. Voice recognition is usually quite accurate but there are transcription errors that can and very often do occur. I apologize for any typographical errors that were not detected and corrected.     Emily Filbert, MD 11/24/19 2236    Emily Filbert, MD 11/24/19 [phone]

## 2019-11-24 NOTE — H&P (Signed)
History and Physical   Allen Potter MYT:[id number] DOB: 12-Feb-1927 DOA: 11/24/2019  Referring MD/NP/PA: Dr. Mayford Knife  PCP: Barbette Reichmann, MD   Outpatient Specialists: None  Patient coming from: [facility]  Chief Complaint: Altered mental status and abnormal labs  HPI: Allen Potter is a 86 y.o. male with medical history significant of CVA, COPD, systolic dysfunction CHF, recurrent UTI, history of DVT, hyperlipidemia who is DNR with a MOST form indicating CPR.  Occasional IV antibiotics and IV fluids only.  Patient brought in today secondary to abnormal labs.  He was found to have BUN of 49 worsening renal function as well as altered mental status.  His wife apparently wants him sent to the hospital so he was brought to the ER.  Wife is aware that patient is deteriorating.  The ER physician has discussed with the wife the fact that patient may not survive hospitalization.  She still wants to try hydration and supportive care but no aggressive care.  Patient is currently unable to give any history.  He was noted to have significant leukocytosis, dehydration, sepsis times syndrome from unknown source.  Urine is still not obtainable as patient is extremely dry.  Chest x-ray showed no pneumonia.  Patient is being admitted for hydration and supportive care.  If no significant improvement patient may need to go to full comfort measures  ED Course: Temperature 97.5 blood pressure 115/80 pulse 91 respiratory rate of 26 oxygen sat 98% on room air.  White count 31.6 hemoglobin 10.2 and platelets 224.  Sodium 142 potassium 4.7 chloride 114 CO2 12 BUN 57 creatinine 1.69 calcium 7.0.  Lactic acid 7.4 and glucose 59.  Chest x-ray showed COPD with possible cavitary process in the left apex.  Also right apical lateral lung lesion.  No obvious infiltrate.  Pending.  Patient is being admitted for hydration and supportive care with antibiotics  Review of Systems: As per HPI otherwise 10 point review of  systems negative.    Past Medical History:  Diagnosis Date  . Anemia    iron deficiency  . COPD (chronic obstructive pulmonary disease) (HCC)   . DVT (deep venous thrombosis) (HCC) 01/2018  . Hyperlipidemia   . Restless leg   . Stroke (HCC) 03/2018  . UTI (urinary tract infection)     Past Surgical History:  Procedure Laterality Date  . CATARACT EXTRACTION Bilateral 2015  . CYSTOSCOPY WITH BIOPSY N/A 12/13/2018   Procedure: CYSTOSCOPY WITH BLADDER BIOPSY;  Surgeon: Sondra Come, MD;  Location: [facility] ORS;  Service: Urology;  Laterality: N/A;  . TRANSURETHRAL RESECTION OF PROSTATE N/A 12/13/2018   Procedure: TRANSURETHRAL RESECTION OF THE PROSTATE (TURP);  Surgeon: Sondra Come, MD;  Location: [facility] ORS;  Service: Urology;  Laterality: N/A;     reports that he quit smoking about 21 years ago. His smoking use included cigarettes. He smoked 0.25 packs per day. He has never used smokeless tobacco. He reports previous alcohol use. He reports that he does not use drugs.  No Known Allergies  History reviewed. No pertinent family history.   Prior to Admission medications   Medication Sig Start Date End Date Taking? Authorizing Provider  acetaminophen (TYLENOL) 325 MG tablet Take 650 mg by mouth every 4 (four) hours as needed for mild pain or fever.   Yes [provider]  acidophilus (RISAQUAD) CAPS capsule Take 1 capsule by mouth daily.   Yes [provider]  albuterol (VENTOLIN HFA) 108 (90 Base) MCG/ACT inhaler Inhale 2 puffs  into the lungs every 6 (six) hours as needed for wheezing or shortness of breath.  10/02/17  Yes [provider]  apixaban (ELIQUIS) 2.5 MG TABS tablet Take 2.5 mg by mouth 2 (two) times daily.  12/16/18  Yes [provider]  carvedilol (COREG) 3.125 MG tablet Take 3.125 mg by mouth 2 (two) times daily.  11/18/18  Yes [provider]  feeding supplement, ENSURE ENLIVE, (ENSURE ENLIVE) LIQD Take 237 mLs by mouth 2 (two)  times daily between meals. 11/15/19  Yes Danford, Earl Lites, MD  fluticasone furoate-vilanterol (BREO ELLIPTA) 100-25 MCG/INH AEPB Inhale 1 puff into the lungs daily. 10/02/17  Yes [provider]  iron polysaccharides (NIFEREX) 150 MG capsule Take 150 mg by mouth daily.   Yes [provider]  liver oil-zinc oxide (DESITIN) 40 % ointment Apply 1 application topically See admin instructions. Apply to sacrum three times daily and as needed for rash and other nonspecific skin eruptions.   Yes [provider]  senna-docusate (SENOKOT-S) 8.6-50 MG tablet Take 1 tablet by mouth daily.   Yes [provider]  simvastatin (ZOCOR) 40 MG tablet Take 1 tablet (40 mg total) by mouth daily at 6 PM. 02/28/18  Yes Altamese Dilling, MD  tiotropium (SPIRIVA HANDIHALER) 18 MCG inhalation capsule Place 1 puff into inhaler and inhale daily. 10/02/17  Yes [provider]  vitamin B-12 (CYANOCOBALAMIN) 1000 MCG tablet Take 1,000 mcg by mouth daily.   Yes [provider]    Physical Exam: Vitals:   11/24/19 2028 11/24/19 2044 11/24/19 2045 11/24/19 2046  BP:  115/80    Pulse:   91   Resp:  (!) 26 (!) 25   Temp:    (!) 97.5 F (36.4 C)  TempSrc:    Axillary  SpO2:   98%   Weight: 56.2 kg     Height: 5\' 9"  (1.753 m)         Constitutional: Cachectic, altered mental status, very dehydrated Vitals:   11/24/19 2028 11/24/19 2044 11/24/19 2045 11/24/19 2046  BP:  115/80    Pulse:   91   Resp:  (!) 26 (!) 25   Temp:    (!) 97.5 F (36.4 C)  TempSrc:    Axillary  SpO2:   98%   Weight: 56.2 kg     Height: 5\' 9"  (1.753 m)      Eyes: PERRL, lids and conjunctivae normal, sunken eyes ENMT: Mucous membranes are dry. Posterior pharynx clear of any exudate or lesions.Normal dentition.  Neck: normal, supple, no masses, no thyromegaly Respiratory: clear to auscultation bilaterally, no wheezing, no crackles. Normal respiratory effort. No accessory muscle use.   Cardiovascular: Sinus tachycardia, no murmurs / rubs / gallops. No extremity edema. 2+ pedal pulses. No carotid bruits.  Abdomen: no tenderness, no masses palpated. No hepatosplenomegaly. Bowel sounds positive.  Scaphoid abdomen Musculoskeletal: no clubbing / cyanosis. No joint deformity upper and lower extremities. Good ROM, no contractures. Normal muscle tone.  Skin: no rashes, lesions, ulcers. No induration Neurologic: CN 2-12 grossly intact. Sensation intact, DTR normal. Strength 5/5 in all 4.  Psychiatric: Confused, unresponsive    Labs on Admission: I have personally reviewed following labs and imaging studies  CBC: Recent Labs  Lab 11/24/19 2144  WBC 31.6*  NEUTROABS 27.6*  HGB 10.2*  HCT 38.3*  MCV 96.2  PLT 224   Basic Metabolic Panel: Recent Labs  Lab 11/24/19 2144  NA 142  K 4.7  CL 114*  CO2  12*  GLUCOSE 59*  BUN 57*  CREATININE 1.69*  CALCIUM 7.0*   GFR: Estimated Creatinine Clearance: 23.1 mL/min (A) (by C-G formula based on SCr of 1.69 mg/dL (H)). Liver Function Tests: Recent Labs  Lab 11/24/19 2144  AST 18  ALT 8  ALKPHOS 52  BILITOT 0.3  PROT <3.0*  ALBUMIN <1.0*   No results for input(s): LIPASE, AMYLASE in the last 168 hours. No results for input(s): AMMONIA in the last 168 hours. Coagulation Profile: No results for input(s): INR, PROTIME in the last 168 hours. Cardiac Enzymes: No results for input(s): CKTOTAL, CKMB, CKMBINDEX, TROPONINI in the last 168 hours. BNP (last 3 results) No results for input(s): PROBNP in the last 8760 hours. HbA1C: No results for input(s): HGBA1C in the last 72 hours. CBG: No results for input(s): GLUCAP in the last 168 hours. Lipid Profile: No results for input(s): CHOL, HDL, LDLCALC, TRIG, CHOLHDL, LDLDIRECT in the last 72 hours. Thyroid Function Tests: No results for input(s): TSH, T4TOTAL, FREET4, T3FREE, THYROIDAB in the last 72 hours. Anemia Panel: No results for input(s): VITAMINB12, FOLATE,  FERRITIN, TIBC, IRON, RETICCTPCT in the last 72 hours. Urine analysis:    Component Value Date/Time   COLORURINE YELLOW (A) 11/12/2019 1305   APPEARANCEUR CLOUDY (A) 11/12/2019 1305   APPEARANCEUR Cloudy (A) 12/03/2018 1113   LABSPEC 1.020 11/12/2019 1305   PHURINE 5.0 11/12/2019 1305   GLUCOSEU NEGATIVE 11/12/2019 1305   HGBUR LARGE (A) 11/12/2019 1305   BILIRUBINUR NEGATIVE 11/12/2019 1305   BILIRUBINUR Negative 12/03/2018 1113   KETONESUR NEGATIVE 11/12/2019 1305   PROTEINUR 100 (A) 11/12/2019 1305   NITRITE NEGATIVE 11/12/2019 1305   LEUKOCYTESUR TRACE (A) 11/12/2019 1305   Sepsis Labs: @LABRCNTIP (procalcitonin:4,lacticidven:4) ) Recent Results (from the past 240 hour(s))  Respiratory Panel by RT PCR (Flu A&B, Covid) - Nasopharyngeal Swab     Status: None   Collection Time: 11/24/19 10:49 PM   Specimen: Nasopharyngeal Swab  Result Value Ref Range Status   SARS Coronavirus 2 by RT PCR NEGATIVE NEGATIVE Final    Comment: (NOTE) SARS-CoV-2 target nucleic acids are NOT DETECTED. The SARS-CoV-2 RNA is generally detectable in upper respiratoy specimens during the acute phase of infection. The lowest concentration of SARS-CoV-2 viral copies this assay can detect is 131 copies/mL. A negative result does not preclude SARS-Cov-2 infection and should not be used as the sole basis for treatment or other patient management decisions. A negative result may occur with  improper specimen collection/handling, submission of specimen other than nasopharyngeal swab, presence of viral mutation(s) within the areas targeted by this assay, and inadequate number of viral copies (<131 copies/mL). A negative result must be combined with clinical observations, patient history, and epidemiological information. The expected result is Negative. Fact Sheet for Patients:  https://www.moore.com/ Fact Sheet for Healthcare Providers:  https://www.young.biz/ This  test is not yet ap proved or cleared by the [place] FDA and  has been authorized for detection and/or diagnosis of SARS-CoV-2 by FDA under an Emergency Use Authorization (EUA). This EUA will remain  in effect (meaning this test can be used) for the duration of the COVID-19 declaration under Section 564(b)(1) of the Act, 21 U.S.C. section 360bbb-3(b)(1), unless the authorization is terminated or revoked sooner.    Influenza A by PCR NEGATIVE NEGATIVE Final   Influenza B by PCR NEGATIVE NEGATIVE Final    Comment: (NOTE) The Xpert Xpress SARS-CoV-2/FLU/RSV assay is intended as an aid in  the diagnosis of influenza from Nasopharyngeal swab specimens  and  should not be used as a sole basis for treatment. Nasal washings and  aspirates are unacceptable for Xpert Xpress SARS-CoV-2/FLU/RSV  testing. Fact Sheet for Patients: PinkCheek.be Fact Sheet for Healthcare Providers: GravelBags.it This test is not yet approved or cleared by the [place] FDA and  has been authorized for detection and/or diagnosis of SARS-CoV-2 by  FDA under an Emergency Use Authorization (EUA). This EUA will remain  in effect (meaning this test can be used) for the duration of the  Covid-19 declaration under Section 564(b)(1) of the Act, 21  U.S.C. section 360bbb-3(b)(1), unless the authorization is  terminated or revoked. Performed at [facility], [address], [place], [place] [address]      Radiological Exams on Admission: DG Chest [facility] 1 View  Result Date: 11/24/2019 CLINICAL DATA:  Weakness EXAM: PORTABLE CHEST 1 VIEW COMPARISON:  11/12/2019, 06/26/2018 FINDINGS: Suspected cavitary process in the left apex. Emphysematous disease. Right lateral lung base nodule previously noted is not well seen today. Coarse chronic interstitial opacities. Stable cardiomediastinal silhouette. Aortic atherosclerosis. Lucency at the right apex and  laterally. Moderate hiatal hernia IMPRESSION: 1. Emphysematous disease. Suspected cavitary process at the left apex. Recommend chest CT for further evaluation. 2. Lucency at the right apicolateral lung, favor skin fold artifact over small pneumothorax. This could also be evaluated at chest CT. Electronically Signed   By: Donavan Foil M.D.   On: 11/24/2019 21:01      Assessment/Plan Principal Problem:   Sepsis ([place]) Active Problems:   Status post CVA   CKD (chronic kidney disease) stage 3, GFR 30-59 ml/min   Generalized weakness   Chronic systolic CHF (congestive heart failure) (HCC)   Leucocytosis     #1 sepsis of unknown source: Patient has advanced disease appears terminal.  We will empirically start Vanco Zosyn for short period of time per family's wishes.  It appears this could be urinary source but we are unable to get urine.  No pneumonia.  We will monitor closely until decision is made.  If patient does not appear to be improving we may have to go comfort measures fully.  #2 severe dehydration: Patient is cachectic and dehydrated.  We will aggressively hydrate and monitor renal function  #3 altered mental status: Secondary to multiple medical problems.  Patient unresponsive to any commands at this point.  #4 history of CHF: At this point patient is severely dehydrated.  We will still proceed with hydration  #5 chronic anticoagulation: All oral medications will be held.  We will still use Lovenox instead for DVT prophylaxis  #6 acute on chronic kidney failure: Patient has chronic kidney disease stage III now worse.  Probably prerenal.  Continue hydration.  DVT prophylaxis: Lovenox Code Status: DNR Family Communication: No family at bedside Disposition Plan: To be determined Consults called: Hospice Admission status: Inpatient  Severity of Illness: The appropriate patient status for this patient is INPATIENT. Inpatient status is judged to be reasonable and necessary in  order to provide the required intensity of service to ensure the patient's safety. The patient's presenting symptoms, physical exam findings, and initial radiographic and laboratory data in the context of their chronic comorbidities is felt to place them at high risk for further clinical deterioration. Furthermore, it is not anticipated that the patient will be medically stable for discharge from the hospital within 2 midnights of admission. The following factors support the patient status of inpatient.   " The patient's presenting symptoms include altered mental status  and weakness. " The worrisome physical exam findings include cachectic and altered. " The initial radiographic and laboratory data are worrisome because of abnormal labs. " The chronic co-morbidities include history of CVA.   * I certify that at the point of admission it is my clinical judgment that the patient will require inpatient hospital care spanning beyond 2 midnights from the point of admission due to high intensity of service, high risk for further deterioration and high frequency of surveillance required.Lonia Blood MD [facility] Pager [phone]  If 7PM-7AM, please contact night-coverage [email or web address] Password [facility]  11/24/2019, 11:53 PM

## 2019-11-25 ENCOUNTER — Other Ambulatory Visit: Payer: Self-pay

## 2019-11-25 DIAGNOSIS — R6520 Severe sepsis without septic shock: Secondary | ICD-10-CM

## 2019-11-25 DIAGNOSIS — Z515 Encounter for palliative care: Secondary | ICD-10-CM

## 2019-11-25 DIAGNOSIS — K921 Melena: Secondary | ICD-10-CM

## 2019-11-25 DIAGNOSIS — I5022 Chronic systolic (congestive) heart failure: Secondary | ICD-10-CM

## 2019-11-25 DIAGNOSIS — E872 Acidosis, unspecified: Secondary | ICD-10-CM

## 2019-11-25 DIAGNOSIS — N179 Acute kidney failure, unspecified: Secondary | ICD-10-CM

## 2019-11-25 DIAGNOSIS — Z7189 Other specified counseling: Secondary | ICD-10-CM

## 2019-11-25 LAB — LACTIC ACID, PLASMA: Lactic Acid, Venous: 2.9 mmol/L (ref 0.5–1.9)

## 2019-11-25 LAB — COMPREHENSIVE METABOLIC PANEL
ALT: 18 U/L (ref 0–44)
AST: 38 U/L (ref 15–41)
Albumin: 2.3 g/dL — ABNORMAL LOW (ref 3.5–5.0)
Alkaline Phosphatase: 114 U/L (ref 38–126)
Anion gap: 10 (ref 5–15)
BUN: 102 mg/dL — ABNORMAL HIGH (ref 8–23)
CO2: 26 mmol/L (ref 22–32)
Calcium: 8.8 mg/dL — ABNORMAL LOW (ref 8.9–10.3)
Chloride: 112 mmol/L — ABNORMAL HIGH (ref 98–111)
Creatinine, Ser: 3.73 mg/dL — ABNORMAL HIGH (ref 0.61–1.24)
GFR calc Af Amer: 16 mL/min — ABNORMAL LOW (ref >60)
GFR calc non Af Amer: 13 mL/min — ABNORMAL LOW (ref >60)
Glucose, Bld: 160 mg/dL — ABNORMAL HIGH (ref 70–99)
Potassium: 4.7 mmol/L (ref 3.5–5.1)
Sodium: 148 mmol/L — ABNORMAL HIGH (ref 135–145)
Total Bilirubin: 0.9 mg/dL (ref 0.3–1.2)
Total Protein: 7.4 g/dL (ref 6.5–8.1)

## 2019-11-25 LAB — CBC
HCT: 44.5 % (ref 39.0–52.0)
Hemoglobin: 13.3 g/dL (ref 13.0–17.0)
MCH: 25.9 pg — ABNORMAL LOW (ref 26.0–34.0)
MCHC: 29.9 g/dL — ABNORMAL LOW (ref 30.0–36.0)
MCV: 86.6 fL (ref 80.0–100.0)
Platelets: 306 10*3/uL (ref 150–400)
RBC: 5.14 MIL/uL (ref 4.22–5.81)
RDW: 17.3 % — ABNORMAL HIGH (ref 11.5–15.5)
WBC: 39.8 10*3/uL — ABNORMAL HIGH (ref 4.0–10.5)
nRBC: 0.3 % — ABNORMAL HIGH (ref 0.0–0.2)

## 2019-11-25 MED ORDER — SCOPOLAMINE 1 MG/3DAYS TD PT72
1.0000 | MEDICATED_PATCH | TRANSDERMAL | Status: DC
  Administered 2019-11-25: 02:00:00 1.5 mg via TRANSDERMAL

## 2019-11-25 MED ORDER — PIPERACILLIN-TAZOBACTAM 3.375 G IVPB: 3.3750 g | Freq: Two times a day (BID) | INTRAVENOUS | Status: DC

## 2019-11-25 MED ORDER — HALOPERIDOL 0.5 MG PO TABS: 0.5000 mg | ORAL_TABLET | ORAL | Status: AC | PRN

## 2019-11-25 MED ORDER — GLYCOPYRROLATE 1 MG PO TABS: 1.0000 mg | ORAL_TABLET | ORAL | Status: AC | PRN

## 2019-11-25 MED ORDER — PIPERACILLIN-TAZOBACTAM 3.375 G IVPB
INTRAVENOUS | Status: AC
  Filled 2019-11-25: qty 50

## 2019-11-25 MED ORDER — SCOPOLAMINE 1 MG/3DAYS TD PT72: 1.0000 | MEDICATED_PATCH | TRANSDERMAL | 12 refills | Status: AC

## 2019-11-25 MED ORDER — HALOPERIDOL LACTATE 2 MG/ML PO CONC
0.5000 mg | ORAL | Status: DC | PRN
  Filled 2019-11-25: qty 0.3

## 2019-11-25 MED ORDER — ONDANSETRON HCL 4 MG/2ML IJ SOLN: 4.0000 mg | Freq: Four times a day (QID) | INTRAMUSCULAR | Status: DC | PRN

## 2019-11-25 MED ORDER — LORAZEPAM 2 MG/ML IJ SOLN: 1.0000 mg | INTRAMUSCULAR | Status: DC | PRN

## 2019-11-25 MED ORDER — GLYCOPYRROLATE 0.2 MG/ML IJ SOLN: 0.2000 mg | INTRAMUSCULAR | Status: DC | PRN

## 2019-11-25 MED ORDER — DEXTROSE-NACL 5-0.9 % IV SOLN: INTRAVENOUS | Status: DC

## 2019-11-25 MED ORDER — BIOTENE DRY MOUTH MT LIQD
15.0000 mL | Freq: Two times a day (BID) | OROMUCOSAL | Status: DC
  Filled 2019-11-25: qty 15

## 2019-11-25 MED ORDER — HALOPERIDOL 0.5 MG PO TABS
0.5000 mg | ORAL_TABLET | ORAL | Status: DC | PRN
  Filled 2019-11-25: qty 1

## 2019-11-25 MED ORDER — HYDROMORPHONE HCL 1 MG/ML IJ SOLN: 0.5000 mg | INTRAMUSCULAR | 0 refills | Status: AC | PRN

## 2019-11-25 MED ORDER — MORPHINE SULFATE (PF) 4 MG/ML IV SOLN: 1.0000 mg | INTRAVENOUS | Status: DC | PRN

## 2019-11-25 MED ORDER — ONDANSETRON HCL 4 MG PO TABS: 4.0000 mg | ORAL_TABLET | Freq: Four times a day (QID) | ORAL | Status: DC | PRN

## 2019-11-25 MED ORDER — HALOPERIDOL LACTATE 5 MG/ML IJ SOLN: 0.5000 mg | INTRAMUSCULAR | Status: DC | PRN

## 2019-11-25 MED ORDER — BIOTENE DRY MOUTH MT LIQD: 15.0000 mL | Freq: Two times a day (BID) | OROMUCOSAL | Status: AC

## 2019-11-25 MED ORDER — LORAZEPAM 1 MG PO TABS: 1.0000 mg | ORAL_TABLET | ORAL | 0 refills | Status: AC | PRN

## 2019-11-25 MED ORDER — GLYCOPYRROLATE 1 MG PO TABS
1.0000 mg | ORAL_TABLET | ORAL | Status: DC | PRN
  Filled 2019-11-25: qty 1

## 2019-11-25 MED ORDER — ENOXAPARIN SODIUM 40 MG/0.4ML ~~LOC~~ SOLN: 30.0000 mg | SUBCUTANEOUS | Status: DC

## 2019-11-25 MED ORDER — SCOPOLAMINE 1 MG/3DAYS TD PT72
MEDICATED_PATCH | TRANSDERMAL | Status: AC
  Filled 2019-11-25: qty 1

## 2019-11-25 MED ORDER — LORAZEPAM 1 MG PO TABS: 1.0000 mg | ORAL_TABLET | ORAL | Status: DC | PRN

## 2019-11-25 MED ORDER — HYDROMORPHONE HCL 1 MG/ML IJ SOLN: 0.5000 mg | INTRAMUSCULAR | Status: DC | PRN

## 2019-11-25 MED ORDER — LORAZEPAM 2 MG/ML PO CONC: 1.0000 mg | ORAL | Status: DC | PRN

## 2019-11-25 MED ORDER — ONDANSETRON HCL 4 MG/2ML IJ SOLN: 4.0000 mg | Freq: Four times a day (QID) | INTRAMUSCULAR | 0 refills | Status: AC | PRN

## 2019-11-25 MED ORDER — PANTOPRAZOLE SODIUM 40 MG IV SOLR
40.0000 mg | Freq: Two times a day (BID) | INTRAVENOUS | Status: DC
  Administered 2019-11-25: 10:00:00 40 mg via INTRAVENOUS
  Filled 2019-11-25 (×2): qty 40

## 2019-11-25 MED ORDER — VANCOMYCIN VARIABLE DOSE PER UNSTABLE RENAL FUNCTION (PHARMACIST DOSING): Status: DC

## 2019-11-25 NOTE — ED Notes (Signed)
CARDIAC MONITORING ER ORDER D/C.

## 2019-11-25 NOTE — Care Management (Signed)
TOC RN CM: Incoming call from [facility] states patient will be discharged home to [facility] today.

## 2019-11-25 NOTE — Discharge Summary (Signed)
[name]    MR#:  [id number]  DATE OF BIRTH:  08-May-1927  DATE OF ADMISSION:  11/24/2019 ADMITTING PHYSICIAN: Elwyn Reach, MD  DATE OF DISCHARGE: 11/25/2019  PRIMARY CARE PHYSICIAN: Tracie Harrier, MD    ADMISSION DIAGNOSIS:  SIRS (systemic inflammatory response syndrome) (HCC) [R65.10] AKI (acute kidney injury) ([place]) [N17.9] Sepsis ([place]) [A41.9]  DISCHARGE DIAGNOSIS:  Principal Problem:   Sepsis ([place]) Active Problems:   Status post CVA   CKD (chronic kidney disease) stage 3, GFR 30-59 ml/min   AKI (acute kidney injury) ([place])   Generalized weakness   Chronic systolic CHF (congestive heart failure) (HCC)   Leucocytosis   Lactic acidosis   Gastrointestinal hemorrhage with melena   Comfort measures only status   Palliative care by specialist   Goals of care, counseling/discussion   SECONDARY DIAGNOSIS:   Past Medical History:  Diagnosis Date  . Anemia    iron deficiency  . COPD (chronic obstructive pulmonary disease) ([place])   . DVT (deep venous thrombosis) ([place]) 01/2018  . Hyperlipidemia   . Restless leg   . Stroke ([place]) 03/2018  . UTI (urinary tract infection)     HOSPITAL COURSE:   1.  Clinical sepsis, lactic acidosis, acute kidney injury and hypotension.  The patient was initially given empiric antibiotics with vancomycin and Zosyn.  Patient had diarrhea which could be stool for C. difficile I ordered a stool for C. difficile.  The patient was already a DO NOT RESUSCITATE.  We were holding antihypertensive medications. 2.  Acute kidney injury with severe dehydration.  The patient was given IV fluids. 3.  Possible GI bleed with black stools and black residual in his mouth.  I ordered IV Protonix and held Eliquis. 4.  Chronic systolic congestive heart failure. 5.  History of DVT and stroke 6.  Lower abdominal pain. 7.  Chest x-ray shows cavitary process in the left apex.  Looking  back at a CAT scan 2 years ago there was a 2.2 cm suspicious left upper lobe nodule in that area.  Patient was seen by palliative care and they made the patient full comfort care measures after speaking with family.  They put in for a hospice referral and they have by hospice home bed available today.  Patient will be discharged to the hospice home.  DISCHARGE CONDITIONS:   Guarded  CONSULTS OBTAINED:   Palliative care Hospice DRUG ALLERGIES:  No Known Allergies  DISCHARGE MEDICATIONS:   Allergies as of 11/25/2019   No Known Allergies     Medication List    STOP taking these medications   acidophilus Caps capsule   apixaban 2.5 MG Tabs tablet Commonly known as: ELIQUIS   Breo Ellipta 100-25 MCG/INH Aepb Generic drug: fluticasone furoate-vilanterol   carvedilol 3.125 MG tablet Commonly known as: COREG   feeding supplement (ENSURE ENLIVE) Liqd   iron polysaccharides 150 MG capsule Commonly known as: NIFEREX   liver oil-zinc oxide 40 % ointment Commonly known as: DESITIN   senna-docusate 8.6-50 MG tablet Commonly known as: Senokot-S   simvastatin 40 MG tablet Commonly known as: ZOCOR   Spiriva HandiHaler 18 MCG inhalation capsule Generic drug: tiotropium   Ventolin HFA 108 (90 Base) MCG/ACT inhaler Generic drug: albuterol   vitamin B-12 1000 MCG tablet Commonly known as: CYANOCOBALAMIN     TAKE these medications   acetaminophen 325 MG tablet Commonly known as: TYLENOL Take 650 mg by mouth every  4 (four) hours as needed for mild pain or fever.   antiseptic oral rinse Liqd Apply 15 mLs topically 2 (two) times daily.   glycopyrrolate 1 MG tablet Commonly known as: ROBINUL Take 1 tablet (1 mg total) by mouth every 4 (four) hours as needed (excessive secretions).   haloperidol 0.5 MG tablet Commonly known as: HALDOL Take 1 tablet (0.5 mg total) by mouth every 4 (four) hours as needed for agitation (or delirium).   HYDROmorphone 1 MG/ML  injection Commonly known as: DILAUDID Inject 0.5 mLs (0.5 mg total) into the vein every 2 (two) hours as needed for severe pain (or dyspnea).   LORazepam 1 MG tablet Commonly known as: ATIVAN Take 1 tablet (1 mg total) by mouth every 4 (four) hours as needed for anxiety.   ondansetron 4 MG/2ML Soln injection Commonly known as: ZOFRAN Inject 2 mLs (4 mg total) into the vein every 6 (six) hours as needed for nausea.   scopolamine 1 MG/3DAYS Commonly known as: TRANSDERM-SCOP Place 1 patch (1.5 mg total) onto the skin every 3 (three) days. Start taking on: November 28, 2019        DISCHARGE INSTRUCTIONS:   Follow-up with the hospice home team.  If you experience worsening of your admission symptoms, develop shortness of breath, life threatening emergency, suicidal or homicidal thoughts you must seek medical attention immediately by calling 911 or calling your MD immediately  if symptoms less severe.  You Must read complete instructions/literature along with all the possible adverse reactions/side effects for all the Medicines you take and that have been prescribed to you. Take any new Medicines after you have completely understood and accept all the possible adverse reactions/side effects.   Please note  You were cared for by a hospitalist during your hospital stay. If you have any questions about your discharge medications or the care you received while you were in the hospital after you are discharged, you can call the unit and asked to speak with the hospitalist on call if the hospitalist that took care of you is not available. Once you are discharged, your primary care physician will handle any further medical issues. Please note that NO REFILLS for any discharge medications will be authorized once you are discharged, as it is imperative that you return to your primary care physician (or establish a relationship with a primary care physician if you do not have one) for your aftercare  needs so that they can reassess your need for medications and monitor your lab values.    Today   CHIEF COMPLAINT:   Chief Complaint  Patient presents with  . Abnormal Lab    HISTORY OF PRESENT ILLNESS:  Allen Potter  is a 86 y.o. male sent in with altered mental status and abnormal labs   VITAL SIGNS:  Blood pressure 100/68, pulse 77, temperature (!) 97.2 F (36.2 C), temperature source Temporal, resp. rate 16, height 5\' 9"  (1.753 m), weight 56.2 kg, SpO2 100 %.  I/O:    Intake/Output Summary (Last 24 hours) at 11/25/2019 1611 Last data filed at 11/25/2019 1500 Gross per 24 hour  Intake 1612.5 ml  Output 1 ml  Net 1611.5 ml     DATA REVIEW:   CBC Recent Labs  Lab 11/25/19 0217  WBC 39.8*  HGB 13.3  HCT 44.5  PLT 306    Chemistries  Recent Labs  Lab 11/25/19 0217  NA 148*  K 4.7  CL 112*  CO2 26  GLUCOSE 160*  BUN  102*  CREATININE 3.73*  CALCIUM 8.8*  AST 38  ALT 18  ALKPHOS 114  BILITOT 0.9    Microbiology Results  Results for orders placed or performed during the hospital encounter of 11/24/19  Blood Culture (routine x 2)     Status: None (Preliminary result)   Collection Time: 11/24/19  8:29 PM   Specimen: BLOOD  Result Value Ref Range Status   Specimen Description BLOOD BLOOD LEFT FOREARM  Final   Special Requests   Final    BOTTLES DRAWN AEROBIC AND ANAEROBIC Blood Culture adequate volume   Culture   Final    NO GROWTH < 12 HOURS Performed at [facility], [address], [place], [place] [address]    Report Status PENDING  Incomplete  Blood Culture (routine x 2)     Status: None (Preliminary result)   Collection Time: 11/24/19  8:34 PM   Specimen: BLOOD  Result Value Ref Range Status   Specimen Description BLOOD BLOOD RIGHT WRIST  Final   Special Requests   Final    BOTTLES DRAWN AEROBIC AND ANAEROBIC Blood Culture adequate volume   Culture   Final    NO GROWTH < 12 HOURS Performed at [facility], [address] [place], [place] [address]    Report Status PENDING  Incomplete  Respiratory Panel by RT PCR (Flu A&B, Covid) - Nasopharyngeal Swab     Status: None   Collection Time: 11/24/19 10:49 PM   Specimen: Nasopharyngeal Swab  Result Value Ref Range Status   SARS Coronavirus 2 by RT PCR NEGATIVE NEGATIVE Final    Comment: (NOTE) SARS-CoV-2 target nucleic acids are NOT DETECTED. The SARS-CoV-2 RNA is generally detectable in upper respiratoy specimens during the acute phase of infection. The lowest concentration of SARS-CoV-2 viral copies this assay can detect is 131 copies/mL. A negative result does not preclude SARS-Cov-2 infection and should not be used as the sole basis for treatment or other patient management decisions. A negative result may occur with  improper specimen collection/handling, submission of specimen other than nasopharyngeal swab, presence of viral mutation(s) within the areas targeted by this assay, and inadequate number of viral copies (<131 copies/mL). A negative result must be combined with clinical observations, patient history, and epidemiological information. The expected result is Negative. Fact Sheet for Patients:  https://www.moore.com/ Fact Sheet for Healthcare Providers:  https://www.young.biz/ This test is not yet ap proved or cleared by the [place] FDA and  has been authorized for detection and/or diagnosis of SARS-CoV-2 by FDA under an Emergency Use Authorization (EUA). This EUA will remain  in effect (meaning this test can be used) for the duration of the COVID-19 declaration under Section 564(b)(1) of the Act, 21 U.S.C. section 360bbb-3(b)(1), unless the authorization is terminated or revoked sooner.    Influenza A by PCR NEGATIVE NEGATIVE Final   Influenza B by PCR NEGATIVE NEGATIVE Final    Comment: (NOTE) The Xpert Xpress SARS-CoV-2/FLU/RSV assay is intended as an aid in  the diagnosis of  influenza from Nasopharyngeal swab specimens and  should not be used as a sole basis for treatment. Nasal washings and  aspirates are unacceptable for Xpert Xpress SARS-CoV-2/FLU/RSV  testing. Fact Sheet for Patients: https://www.moore.com/ Fact Sheet for Healthcare Providers: https://www.young.biz/ This test is not yet approved or cleared by the [place] FDA and  has been authorized for detection and/or diagnosis of SARS-CoV-2 by  FDA under an Emergency Use Authorization (EUA). This EUA will remain  in  effect (meaning this test can be used) for the duration of the  Covid-19 declaration under Section 564(b)(1) of the Act, 21  U.S.C. section 360bbb-3(b)(1), unless the authorization is  terminated or revoked. Performed at [facility], [address] [place] [place], [place] [address]     RADIOLOGY:  [facility] Chest Port 1 View  Result Date: 11/24/2019 CLINICAL DATA:  Weakness EXAM: PORTABLE CHEST 1 VIEW COMPARISON:  11/12/2019, 06/26/2018 FINDINGS: Suspected cavitary process in the left apex. Emphysematous disease. Right lateral lung base nodule previously noted is not well seen today. Coarse chronic interstitial opacities. Stable cardiomediastinal silhouette. Aortic atherosclerosis. Lucency at the right apex and laterally. Moderate hiatal hernia IMPRESSION: 1. Emphysematous disease. Suspected cavitary process at the left apex. Recommend chest CT for further evaluation. 2. Lucency at the right apicolateral lung, favor skin fold artifact over small pneumothorax. This could also be evaluated at chest CT. Electronically Signed   By: Jasmine Pang M.D.   On: 11/24/2019 21:01     CODE STATUS:     Code Status Orders  (From admission, onward)         Start     Ordered   11/25/19 1439  Do not attempt resuscitation (DNR)  Continuous    Question Answer Comment  In the event of cardiac or respiratory ARREST Do not call a "code blue"   In the event of  cardiac or respiratory ARREST Do not perform Intubation, CPR, defibrillation or ACLS   In the event of cardiac or respiratory ARREST Use medication by any route, position, wound care, and other measures to relive pain and suffering. May use oxygen, suction and manual treatment of airway obstruction as needed for comfort.      11/25/19 1441        Code Status History    Date Active Date Inactive Code Status Order ID Comments User Context   11/25/2019 0140 11/25/2019 1441 DNR [id number]  Rometta Emery, MD Inpatient   11/24/2019 2136 11/25/2019 0140 DNR [id number]  Emily Filbert, MD ED   11/12/2019 1735 11/15/2019 0906 Full Code [id number]  Lorretta Harp, MD ED   12/13/2018 1341 12/14/2018 1804 Full Code [id number]  Sondra Come, MD Inpatient   06/12/2018 2002 06/15/2018 1831 Full Code [id number]  Shaune Pollack, MD Inpatient   02/25/2018 1810 02/28/2018 1745 DNR [id number]  Salary, Evelena Asa, MD Inpatient   Advance Care Planning Activity    Advance Directive Documentation     Most Recent Value  Type of Advance Directive  Out of facility DNR (pink MOST or yellow form)  Pre-existing out of facility DNR order (yellow form or pink MOST form)  Pink MOST form placed in chart (order not valid for inpatient use)  "MOST" Form in Place?  --      TOTAL TIME TAKING CARE OF THIS PATIENT: 40 minutes.    Alford Highland M.D on 11/25/2019 at 4:11 PM  Between 7am to 6pm - Pager - [phone]  After 6pm go to [email or web address] - password [email or web address]  [facility]  CC: Primary care physician; Barbette Reichmann, MD

## 2019-11-25 NOTE — Progress Notes (Signed)
Spoke with patient's wife, [name] with update. Offered to visit patient; [name] appreciative and agreeable; states she will arrive around noon.

## 2019-11-25 NOTE — Progress Notes (Signed)
New referral for [facility] home received from Palliative NP Harvest Dark, [facility] Dagoberto Reef aware. Patient information sent to referral. Writer spoke with patient's spouse Ricke Hey via telephone to initiate education regarding hospice services, philosophy, team approach to care and current visitation policy with understanding voiced. Patient has been approved by the hospice medical director and bed is available for transfer today. Ms. Einar Gip is agreeable to this. Hospital care team updated.  Report called to the hospice home. Signed out of facility DNR in place in discharge packet. EMS notified for transport. PIV to remain in place, staff RN Jasmine December aware. Thank you for the opportunity to be involved in the care of this patient and his family. Dayna Barker BSN, RN, [facility] [employer] [phone]

## 2019-11-25 NOTE — Progress Notes (Signed)
Pharmacy Antibiotic Note  Allen Potter is a 86 y.o. male admitted on 11/24/2019 with sepsis-undetermined source.  Pharmacy has been consulted for Vancomycin dosing. -[facility] - empirically start Vanco Zosyn for short period of time per family's wishes per MD note.   Plan: Received Zosyn x1 and Vanc in ED -Zosyn adjusted from 3.375gm EI q8h to q12h for Crcl < 20 ml/min   -Patient received 1000mg  x 1 in ED followed by 500mg  for a Loading dose of 1500mg . Due to patients Crcl = 10 ml/min and unstable renal fxn, will dose further Vancomycin by levels. Will draw random Vancomycin level with am labs (last dose given 0126 @ 0152)    Height: 5\' 9"  (175.3 cm) Weight: 123 lb 14.4 oz (56.2 kg) IBW/kg (Calculated) : 70.7  Temp (24hrs), Avg:97.5 F (36.4 C), Min:97.5 F (36.4 C), Max:97.5 F (36.4 C)  Recent Labs  Lab 11/24/19 2144 11/25/19 0217  WBC 31.6* 39.8*  CREATININE 1.69* 3.73*  LATICACIDVEN 7.4* 2.9*    Estimated Creatinine Clearance: 10.5 mL/min (A) (by C-G formula based on SCr of 3.73 mg/dL (H)).    No Known Allergies  Antimicrobials this admission: Zosyn 1/25 >> Vanc 1/15 >>  Dose adjustments this admission:    Microbiology results: 1/26 BCx: Pend 1/26  UCx: pend    Sputum:      MRSA PCR:    Thank you for allowing pharmacy to be a part of this patient's care.  Jeidi Gilles A 11/25/2019 9:18 AM

## 2019-11-25 NOTE — Progress Notes (Signed)
Patient ID: Allen Potter, male   DOB: 1927/03/04, 86 y.o.   MRN: [id number] [facility] PROGRESS NOTE  Allen Potter KDT:[id number] DOB: 14-Jan-1927 DOA: 11/24/2019 PCP: Barbette Reichmann, MD  HPI/Subjective: Called to see patient because of desaturation and hypotension.  Patient does answer a few yes or no questions.  Feels okay.  No abdominal pain.  Nurse noted black diarrhea and black residual in mouth.  Objective: Vitals:   11/25/19 0750 11/25/19 0752  BP:  (!) 87/58  Pulse:    Resp: 15   Temp: (!) 97.5 F (36.4 C)   SpO2:  96%    Intake/Output Summary (Last 24 hours) at 11/25/2019 0912 Last data filed at 11/25/2019 0630 Gross per 24 hour  Intake 612.5 ml  Output 1 ml  Net 611.5 ml   Filed Weights   11/24/19 2028  Weight: 56.2 kg    ROS: Review of Systems  Unable to perform ROS: Acuity of condition  Respiratory: Negative for shortness of breath.   Cardiovascular: Negative for chest pain.  Gastrointestinal: Negative for abdominal pain.   Exam: Physical Exam  HENT:  Nose: No mucosal edema.  Dry mouth with black residual.  Eyes: Conjunctivae and lids are normal.  Neck: Carotid bruit is not present.  Cardiovascular:  Pulses:      Dorsalis pedis pulses are 1+ on the right side and 1+ on the left side.  Respiratory: He has decreased breath sounds in the right lower field and the left lower field. He has no wheezes. He has no rhonchi. He has no rales.  GI: Soft. Bowel sounds are normal. There is abdominal tenderness in the suprapubic area.  Musculoskeletal:     Right ankle: No swelling.     Left ankle: No swelling.  Lymphadenopathy:    He has no cervical adenopathy.  Neurological: He is alert.  Able to move his extremities on his own  Skin: Skin is warm. No rash noted.  Psychiatric:  Answers a few yes or no questions      Data Reviewed: Basic Metabolic Panel: Recent Labs  Lab 11/24/19 2144 11/25/19 0217  NA 142 148*  K 4.7 4.7  CL 114* 112*   CO2 12* 26  GLUCOSE 59* 160*  BUN 57* 102*  CREATININE 1.69* 3.73*  CALCIUM 7.0* 8.8*   Liver Function Tests: Recent Labs  Lab 11/24/19 2144 11/25/19 0217  AST 18 38  ALT 8 18  ALKPHOS 52 114  BILITOT 0.3 0.9  PROT <3.0* 7.4  ALBUMIN <1.0* 2.3*  CBC: Recent Labs  Lab 11/24/19 2144 11/25/19 0217  WBC 31.6* 39.8*  NEUTROABS 27.6*  --   HGB 10.2* 13.3  HCT 38.3* 44.5  MCV 96.2 86.6  PLT 224 306   BNP (last 3 results) Recent Labs    11/12/19 1107  BNP 132.0*    Recent Results (from the past 240 hour(s))  Blood Culture (routine x 2)     Status: None (Preliminary result)   Collection Time: 11/24/19  8:29 PM   Specimen: BLOOD  Result Value Ref Range Status   Specimen Description BLOOD BLOOD LEFT FOREARM  Final   Special Requests   Final    BOTTLES DRAWN AEROBIC AND ANAEROBIC Blood Culture adequate volume   Culture   Final    NO GROWTH < 12 HOURS Performed at [facility], [address], [place], [place] [address]    Report Status PENDING  Incomplete  Blood Culture (routine x 2)  Status: None (Preliminary result)   Collection Time: 11/24/19  8:34 PM   Specimen: BLOOD  Result Value Ref Range Status   Specimen Description BLOOD BLOOD RIGHT WRIST  Final   Special Requests   Final    BOTTLES DRAWN AEROBIC AND ANAEROBIC Blood Culture adequate volume   Culture   Final    NO GROWTH < 12 HOURS Performed at [facility], [address], [place], [place] [address]    Report Status PENDING  Incomplete  Respiratory Panel by RT PCR (Flu A&B, Covid) - Nasopharyngeal Swab     Status: None   Collection Time: 11/24/19 10:49 PM   Specimen: Nasopharyngeal Swab  Result Value Ref Range Status   SARS Coronavirus 2 by RT PCR NEGATIVE NEGATIVE Final    Comment: (NOTE) SARS-CoV-2 target nucleic acids are NOT DETECTED. The SARS-CoV-2 RNA is generally detectable in upper respiratoy specimens during the acute phase of infection. The lowest concentration of  SARS-CoV-2 viral copies this assay can detect is 131 copies/mL. A negative result does not preclude SARS-Cov-2 infection and should not be used as the sole basis for treatment or other patient management decisions. A negative result may occur with  improper specimen collection/handling, submission of specimen other than nasopharyngeal swab, presence of viral mutation(s) within the areas targeted by this assay, and inadequate number of viral copies (<131 copies/mL). A negative result must be combined with clinical observations, patient history, and epidemiological information. The expected result is Negative. Fact Sheet for Patients:  https://www.moore.com/ Fact Sheet for Healthcare Providers:  https://www.young.biz/ This test is not yet ap proved or cleared by the [place] FDA and  has been authorized for detection and/or diagnosis of SARS-CoV-2 by FDA under an Emergency Use Authorization (EUA). This EUA will remain  in effect (meaning this test can be used) for the duration of the COVID-19 declaration under Section 564(b)(1) of the Act, 21 U.S.C. section 360bbb-3(b)(1), unless the authorization is terminated or revoked sooner.    Influenza A by PCR NEGATIVE NEGATIVE Final   Influenza B by PCR NEGATIVE NEGATIVE Final    Comment: (NOTE) The Xpert Xpress SARS-CoV-2/FLU/RSV assay is intended as an aid in  the diagnosis of influenza from Nasopharyngeal swab specimens and  should not be used as a sole basis for treatment. Nasal washings and  aspirates are unacceptable for Xpert Xpress SARS-CoV-2/FLU/RSV  testing. Fact Sheet for Patients: https://www.moore.com/ Fact Sheet for Healthcare Providers: https://www.young.biz/ This test is not yet approved or cleared by the [place] FDA and  has been authorized for detection and/or diagnosis of SARS-CoV-2 by  FDA under an Emergency Use Authorization (EUA).  This EUA will remain  in effect (meaning this test can be used) for the duration of the  Covid-19 declaration under Section 564(b)(1) of the Act, 21  U.S.C. section 360bbb-3(b)(1), unless the authorization is  terminated or revoked. Performed at [facility], [address] [place], [place] [address]      Studies: [facility] 1 View  Result Date: 11/24/2019 CLINICAL DATA:  Weakness EXAM: PORTABLE CHEST 1 VIEW COMPARISON:  11/12/2019, 06/26/2018 FINDINGS: Suspected cavitary process in the left apex. Emphysematous disease. Right lateral lung base nodule previously noted is not well seen today. Coarse chronic interstitial opacities. Stable cardiomediastinal silhouette. Aortic atherosclerosis. Lucency at the right apex and laterally. Moderate hiatal hernia IMPRESSION: 1. Emphysematous disease. Suspected cavitary process at the left apex. Recommend chest CT for further evaluation. 2. Lucency at the right apicolateral lung, favor skin fold  artifact over small pneumothorax. This could also be evaluated at chest CT. Electronically Signed   By: Donavan Foil M.D.   On: 11/24/2019 21:01    Scheduled Meds: . pantoprazole (PROTONIX) IV  40 mg Intravenous Q12H  . scopolamine  1 patch Transdermal Q72H   Continuous Infusions: . dextrose 5 % and 0.9% NaCl 125 mL/hr at 11/25/19 0228  . piperacillin-tazobactam (ZOSYN)  IV      Assessment/Plan:  1. Clinical sepsis, lactic acidosis, acute kidney injury, hypotension.  Patient given empiric antibiotics with vancomycin and Zosyn.  With patient having diarrhea could be C. difficile.  Send stool for C. difficile.  Patient is a DO NOT RESUSCITATE.  Hold antihypertensive medications 2. Acute kidney injury with severe dehydration.  Continue forcing IV fluid hydration 3. Possible GI bleed with black stools.  We will give IV Protonix.  Hold Eliquis 4. Chronic systolic congestive heart failure.  Need to watch closely hydration status. 5. History of DVT  and stroke.  Need to hold anticoagulation currently 6. Lower abdominal pain.  Bladder scan did not show any urinary retention at this time.  Send urine when able.  Code Status:     Code Status Orders  (From admission, onward)         Start     Ordered   11/25/19 0141  Do not attempt resuscitation (DNR)  Continuous    Question Answer Comment  In the event of cardiac or respiratory ARREST Do not call a "code blue"   In the event of cardiac or respiratory ARREST Do not perform Intubation, CPR, defibrillation or ACLS   In the event of cardiac or respiratory ARREST Use medication by any route, position, wound care, and other measures to relive pain and suffering. May use oxygen, suction and manual treatment of airway obstruction as needed for comfort.      11/25/19 0140        Code Status History    Date Active Date Inactive Code Status Order ID Comments User Context   11/24/2019 2136 11/25/2019 0140 DNR [id number]  Earleen Newport, MD ED   11/12/2019 1735 11/15/2019 0906 Full Code [id number]  Ivor Costa, MD ED   12/13/2018 1341 12/14/2018 1804 Full Code [id number]  Billey Co, MD Inpatient   06/12/2018 2002 06/15/2018 1831 Full Code [id number]  Demetrios Loll, MD Inpatient   02/25/2018 1810 02/28/2018 1745 DNR [id number]  Salary, Avel Peace, MD Inpatient   Advance Care Planning Activity    Advance Directive Documentation     Most Recent Value  Type of Advance Directive  Out of facility DNR (pink MOST or yellow form)  Pre-existing out of facility DNR order (yellow form or pink MOST form)  Pink MOST form placed in chart (order not valid for inpatient use)  "MOST" Form in Place?  --     Family Communication: Spoke with wife on the phone on cell phone [phone]  disposition Plan: Overall prognosis is poor.  Awaiting palliative care evaluation  Antibiotics:  Vancomycin  Zosyn  Time spent: 28 minutes Kenedee Molesky [employer]

## 2019-11-25 NOTE — Consult Note (Signed)
Consultation Note Date: 11/25/2019   Patient Name: Allen Potter  DOB: 25-Jul-1927  MRN: [id number]  Age / Sex: 86 y.o., male  PCP: Tracie Harrier, MD Referring Physician: Loletha Grayer, MD  Reason for Consultation: Establishing goals of care and Terminal Care  HPI/Patient Profile: 86 y.o. male  with past medical history of COPD on oxygen, CHF, CVA, recurrent UTI, DVT, dementia, and HLD admitted on 11/24/2019 with abnormal labs. Found to have AKI and lactic acidosis. Treatment initiated for sepsis. Severely dehydrated. Also possible GI bleed. Despite fluid resuscitation, patient with worsening kidney function and mostly unresponsive. No PO intake. PMT consulted for goals of care.   Clinical Assessment and Goals of Care: I have reviewed medical records including EPIC notes, labs and imaging, received report from RN, assessed the patient and then met with patient's wife, Tamela Oddi, and family friend, Suanne Marker,  to discuss diagnosis, prognosis, [place], EOL wishes, disposition and options.  I introduced Palliative Medicine as specialized medical care for people living with serious illness. It focuses on providing relief from the symptoms and stress of a serious illness. The goal is to improve quality of life for both the patient and the family.  We discussed a brief life review of the patient.   As far as functional and nutritional status, family tells me patient was doing well prior to current illness - was mobile, ate/drank well. They do speak of overall decline since his stroke ~ 2 years ago.    We discussed his current illness and what it means in the larger context of his on-going co-morbidities.  Natural disease trajectory and expectations at EOL were discussed. Specifically discussed that patient appeared to be nearing end of life - possible hours - days.   I attempted to elicit values and goals of care important to the patient.  Family shares  patient would want to focus on comfort given current situation.   The difference between aggressive medical intervention and comfort care was considered in light of the patient's goals of care. Discussed what transition to full comfort care entails and family agrees that is what patient would want and they want for him.   Hospice services outpatient were explained and offered - specifically discussed hospice facility placement. Family is interested in this.   Questions and concerns were addressed. The family was encouraged to call with questions or concerns.   Primary Decision Maker NEXT OF KIN - wife Solmon Ice - patient unable to participate in conversation d/t unresponsive status    SUMMARY OF RECOMMENDATIONS   - will transition to full comfort measures and order comfort medications as needed for pain, dyspnea, agitation, and excessive secretions - referral to [facility] for hosp[facility] facility placement  Code Status/Advance Care Planning:  DNR   Palliative Prophylaxis:   Frequent Pain Assessment, Oral Care and Turn Reposition  Additional Recommendations (Limitations, Scope, Preferences):  Full Comfort Care  Psycho-social/Spiritual:   Desire for further Chaplaincy support:no  Additional Recommendations: Education on Hospice  Prognosis:   < 2 weeks  Discharge Planning: Hospice facility      Primary Diagnoses: Present on Admission: . CKD (chronic kidney disease) stage 3, GFR 30-59 ml/min . Chronic systolic CHF (congestive heart failure) ([place]) . Leucocytosis . Sepsis ([place])   I have reviewed the medical record, interviewed the patient and family, and examined the patient. The following aspects are pertinent.  Past Medical History:  Diagnosis Date  . Anemia    iron deficiency  . COPD (chronic obstructive pulmonary disease) ([place])   .  DVT (deep venous thrombosis) ([place]) 01/2018  . Hyperlipidemia   . Restless leg   . Stroke ([place]) 03/2018  . UTI (urinary tract infection)      Social History   Socioeconomic History  . Marital status: Married    Spouse name: Doris  . Number of children: Not on file  . Years of education: Not on file  . Highest education level: Not on file  Occupational History  . 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 Occupation: self employed putting in pump stations  . 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 Smoking status: Former Smoker    Packs/day: 0.25    Types: Cigarettes    Quit date: 2000    Years since quitting: 21.0  . Smokeless tobacco: Never Used  Substance and Sexual Activity  . Alcohol use: Not Currently  . Drug use: Never  . Sexual activity: Not Currently    Birth control/protection: None  Other Topics Concern  . Not on file  Social History Narrative  . Not on file   Social Determinants of Health   Financial Resource Strain:   . Difficulty of Paying Living Expenses: Not on file  Food Insecurity:   . Worried About Charity fundraiser in the Last Year: Not on file  . Ran Out of Food in the Last Year: Not on file  Transportation Needs:   . Lack of Transportation (Medical): Not on file  . Lack of Transportation (Non-Medical): Not on file  Physical Activity:   . Days of Exercise per Week: Not on file  . Minutes of Exercise per Session: Not on file  Stress:   . Feeling of Stress : Not on file  Social Connections:   . Frequency of Communication with Friends and Family: Not on file  . Frequency of Social Gatherings with Friends and Family: Not on file  . Attends Religious Services: Not on file  . Active Member of Clubs or Organizations: Not on file  . Attends Archivist Meetings: Not on file  . Marital Status: Not on file   History reviewed. No pertinent family history. Scheduled Meds: . antiseptic oral rinse  15 mL Topical BID  . scopolamine  1 patch Transdermal Q72H   Continuous Infusions: PRN Meds:.glycopyrrolate **OR** glycopyrrolate **OR**  glycopyrrolate, haloperidol **OR** haloperidol **OR** haloperidol lactate, HYDROmorphone (DILAUDID) injection, LORazepam **OR** LORazepam **OR** LORazepam, ondansetron **OR** ondansetron (ZOFRAN) IV No Known Allergies Review of Systems  Unable to perform ROS: Patient unresponsive    Physical Exam Vitals and nursing note reviewed.  Constitutional:      General: He is not in acute distress.    Appearance: He is ill-appearing. He is not diaphoretic.  HENT:     Head: Normocephalic and atraumatic.     Mouth/Throat:     Mouth: Mucous membranes are dry.  Cardiovascular:     Rate and Rhythm: Normal rate.     Pulses: Normal pulses.  Pulmonary:     Effort: Pulmonary effort is normal. No respiratory distress.     Breath sounds: No wheezing or rhonchi.  Abdominal:     Palpations: Abdomen is soft.  Musculoskeletal:     Right lower leg: No edema.     Left lower leg: No edema.     Comments: Cool extremities  Neurological:     Comments: Mostly unresponsive - occasional nod or moan     Vital Signs: BP 100/68 (BP Location: Right Arm)  Pulse 77   Temp (!) 97.2 F (36.2 C) (Temporal)   Resp 16   Ht '5\' 9"'  (1.753 m)   Wt 56.2 kg   SpO2 100%   BMI 18.30 kg/m  Pain Scale: 0-10   Pain Score: 0-No pain   SpO2: SpO2: 100 % O2 Device:SpO2: 100 % O2 Flow Rate: .O2 Flow Rate (L/min): 2 L/min  IO: Intake/output summary:   Intake/Output Summary (Last 24 hours) at 11/25/2019 1504 Last data filed at 11/25/2019 0630 Gross per 24 hour  Intake 612.5 ml  Output 1 ml  Net 611.5 ml    LBM:   Baseline Weight: Weight: 56.2 kg Most recent weight: Weight: 56.2 kg     Palliative Assessment/Data: PPS 10%    Time Total: 70 minutes Greater than 50%  of this time was spent counseling and coordinating care related to the above assessment and plan.  Juel Burrow, DNP, AGNP-C Palliative Medicine Team [phone] Pager: [phone]

## 2019-11-29 LAB — CULTURE, BLOOD (ROUTINE X 2)
Culture: NO GROWTH
Culture: NO GROWTH
Special Requests: ADEQUATE
Special Requests: ADEQUATE

## 2019-12-01 DEATH — deceased

## 2020-05-27 ENCOUNTER — Ambulatory Visit: Payer: Medicare Other | Admitting: Urology

## 2020-10-17 IMAGING — CT CT HEAD W/O CM
3 series · 15 of 47 positions shown, 18 images · non-contrast
Comparison: Brain MRI 05/21/2018

CLINICAL DATA: Recent UTI. Weakness, urinary retention and falls.

EXAM:
CT HEAD WITHOUT CONTRAST
TECHNIQUE: Contiguous axial images were obtained from the base of the skull
through the vertex without intravenous contrast.

[Series 2: head wo · axial · 0.47mm/px · z∈[-146,-21]mm · 9 of 31 slices shown, 12 images]
[im 3/31  brain]
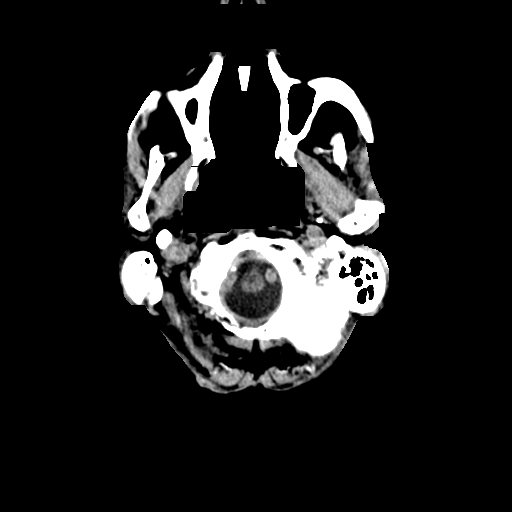
[im 3/31  bone]
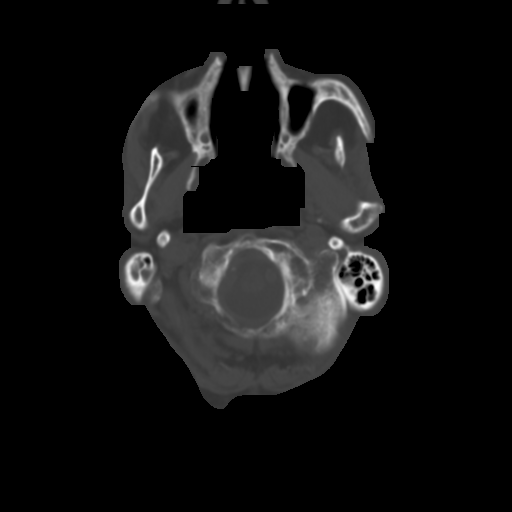
[im 6/31  brain]
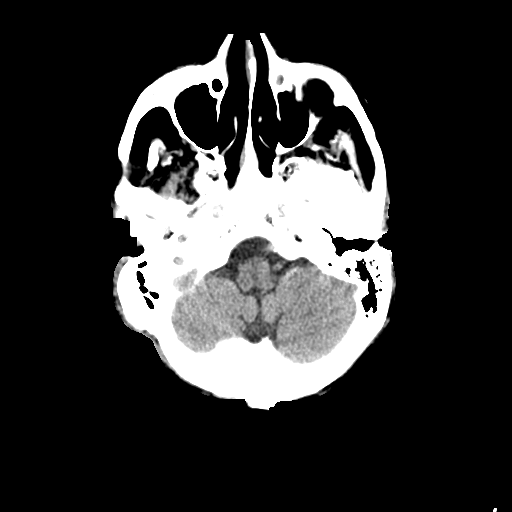
[im 9/31  brain]
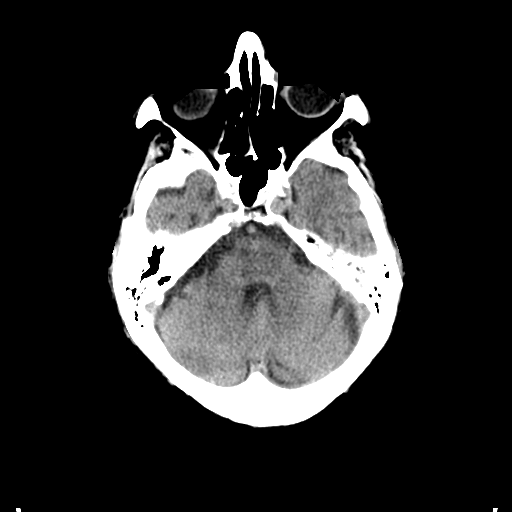
[im 12/31  brain]
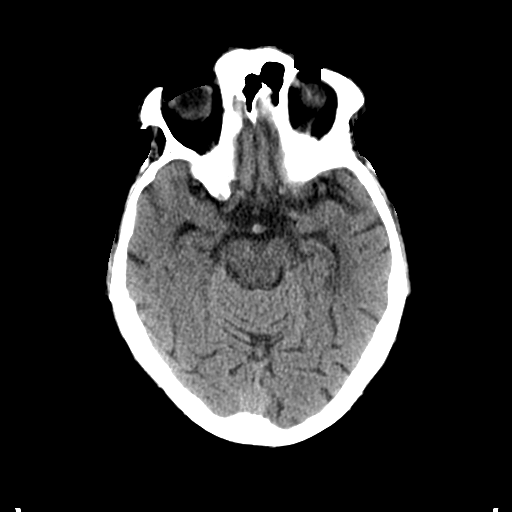
[im 16/31  brain]
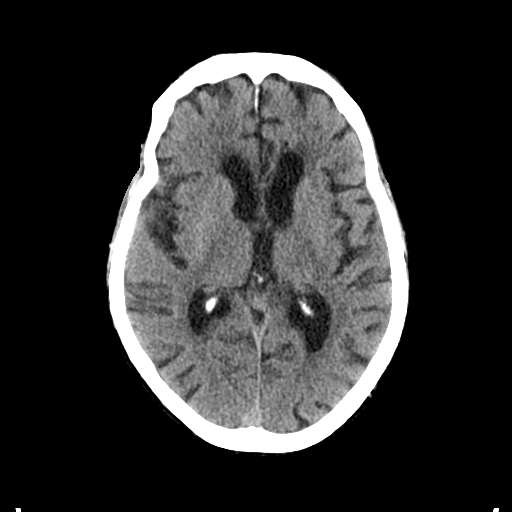
[im 16/31  bone]
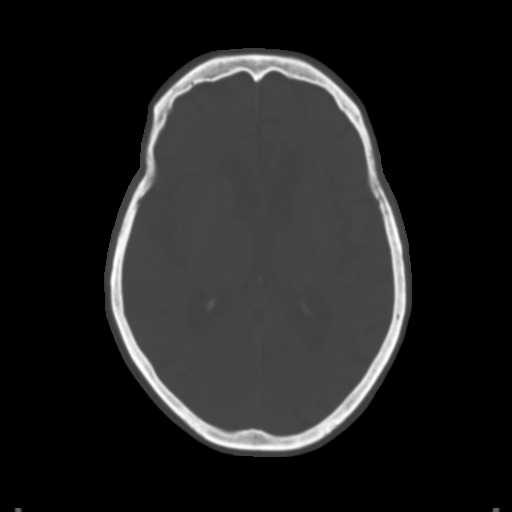
[im 19/31  brain]
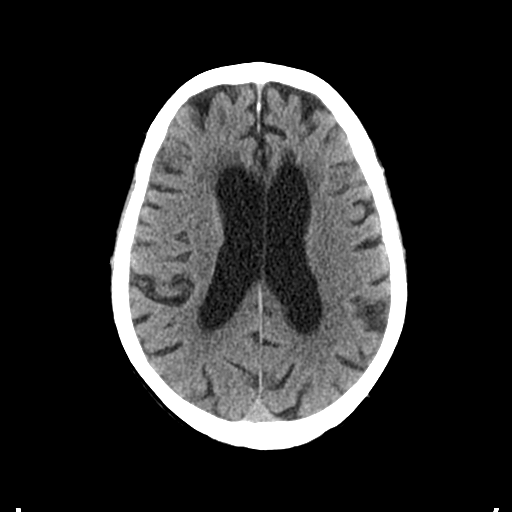
[im 22/31  brain]
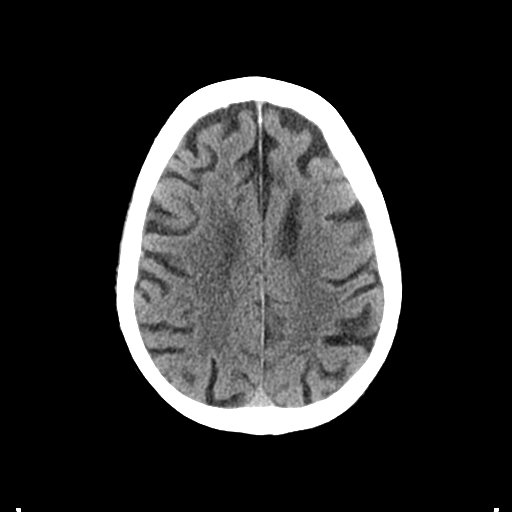
[im 25/31  brain]
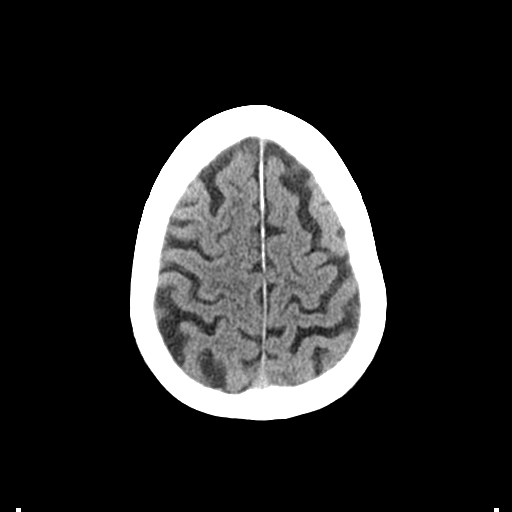
[im 28/31  brain]
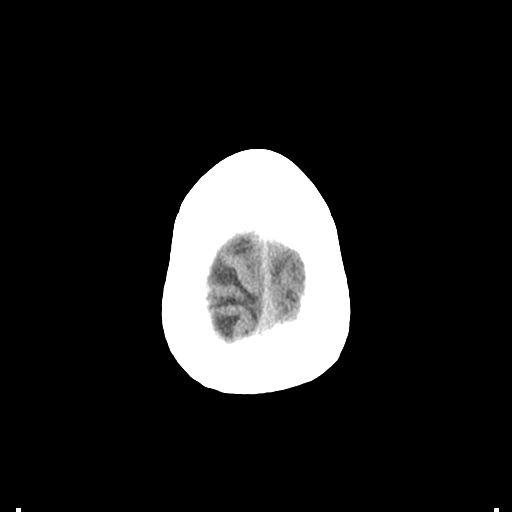
[im 28/31  bone]
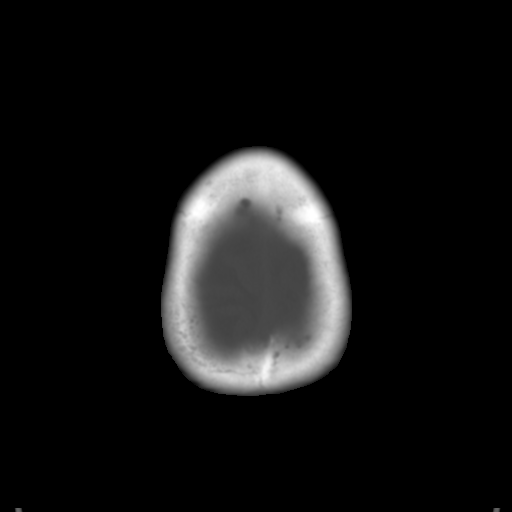

[Series 4: coronal soft tissue · coronal · 0.31mm/px · 3 of 62 slices shown]
[im 21/62  brain]
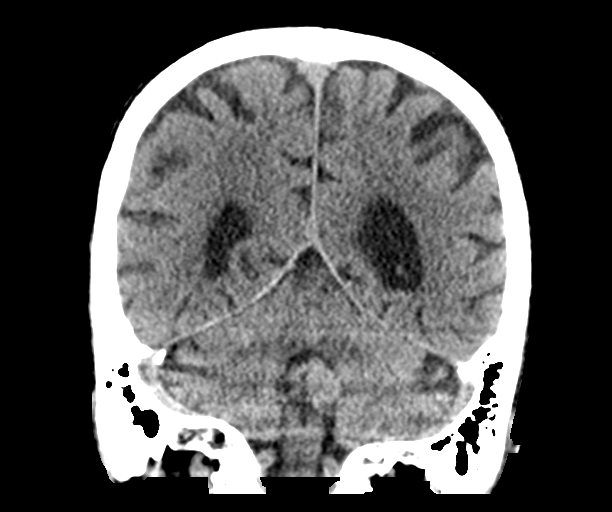
[im 28/62  brain]
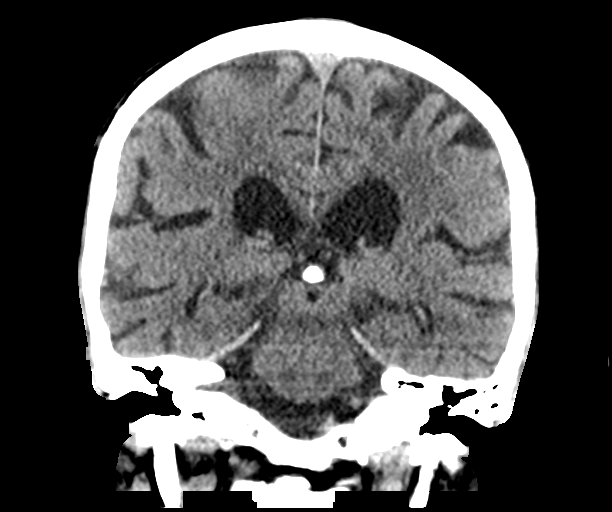
[im 34/62  brain]
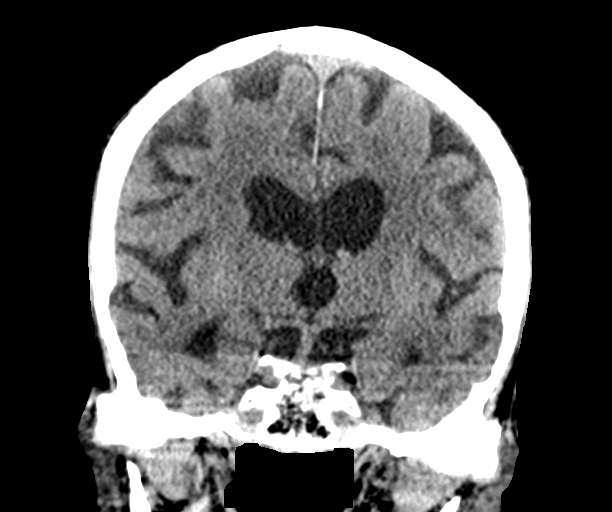

[Series 5: sagittal soft tissue · sagittal · 0.32mm/px · 3 of 48 slices shown]
[im 16/48  brain]
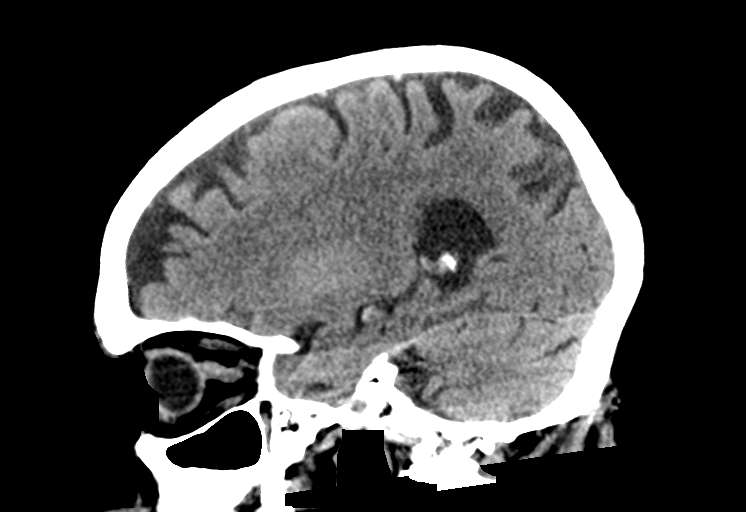
[im 24/48  brain]
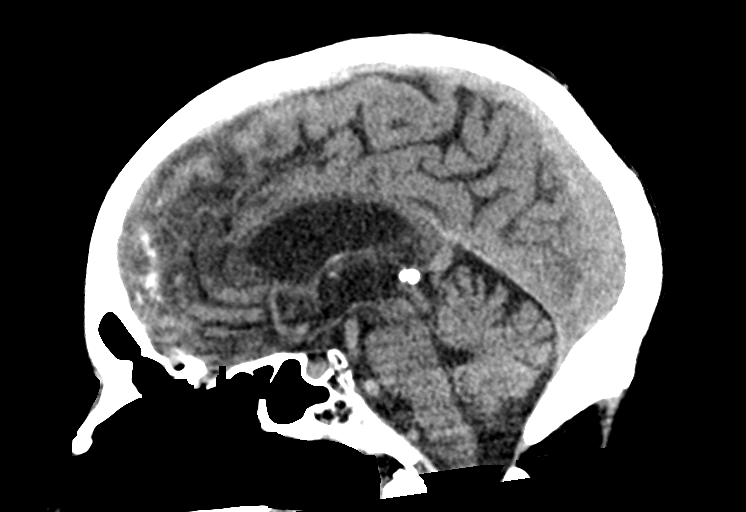
[im 32/48  brain]
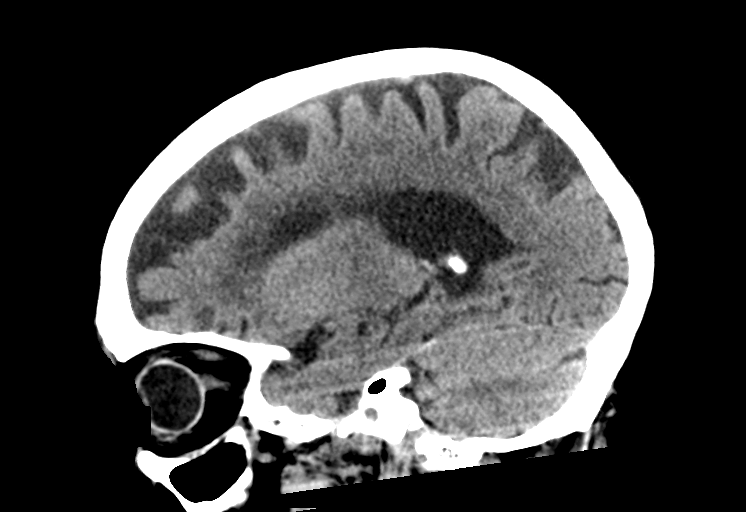

[15 of 47 positions shown; findings below may reference images not displayed]

FINDINGS: Brain: No evidence of acute infarction, hemorrhage, hydrocephalus,
extra-axial collection or mass lesion/mass effect. There is mild
diffuse low-attenuation within the subcortical and periventricular
white matter compatible with chronic microvascular disease.
Prominence of the CSF spaces noted compatible with brain atrophy.

Vascular: No hyperdense vessel or unexpected calcification.

Skull: Normal. Negative for fracture or focal lesion.

Sinuses/Orbits: Asymmetric partial opacification of the right
mastoid air cells. The remaining mastoid air cells and paranasal
sinuses appear clear.

Other: None.
IMPRESSION: 1. No acute intracranial abnormalities.
2. Chronic small vessel ischemic change and brain atrophy.
3. Asymmetric partial opacification of the right mastoid air cells.

## 2020-10-29 IMAGING — DX DG CHEST 1V PORT
1 series · 2 of 2 positions shown · non-contrast
Comparison: 11/12/2019, 06/26/2018

CLINICAL DATA: Weakness

EXAM:
PORTABLE CHEST 1 VIEW

[Series 1: chest ap · 0.14mm/px · 2 of 2 slices shown]
[im 1/2]
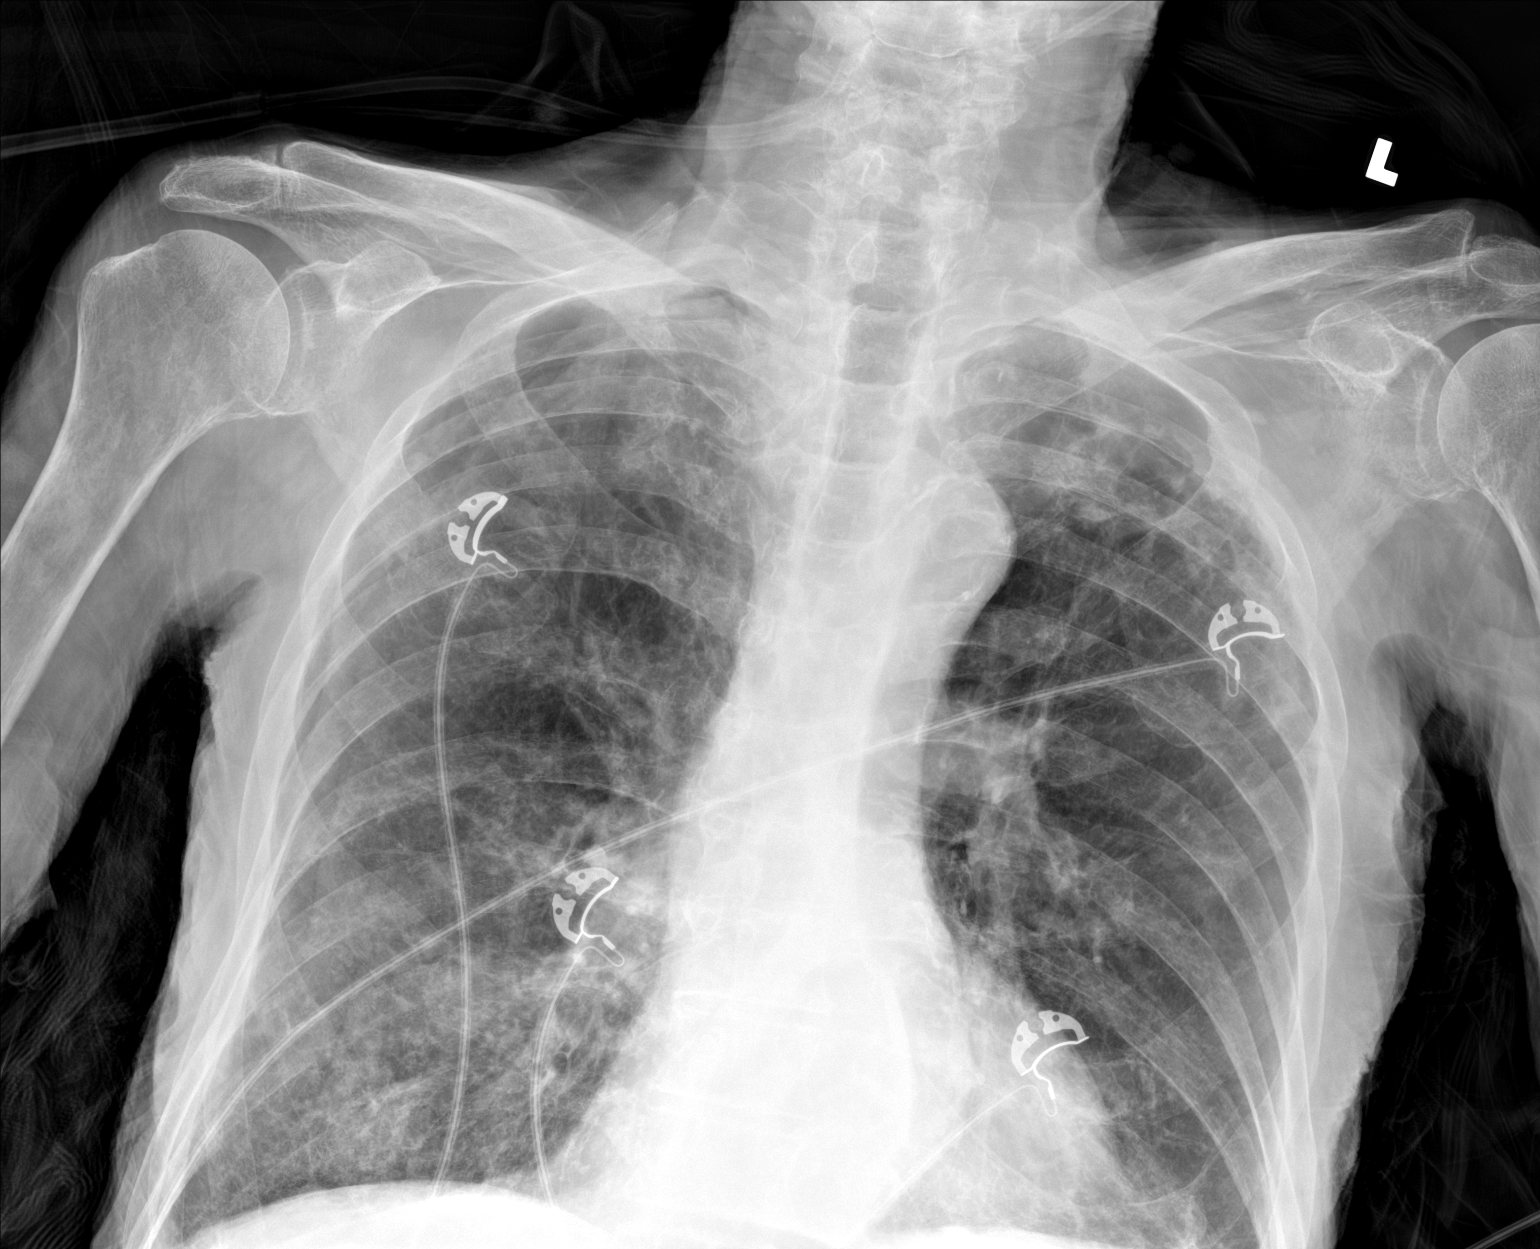
[im 2/2]
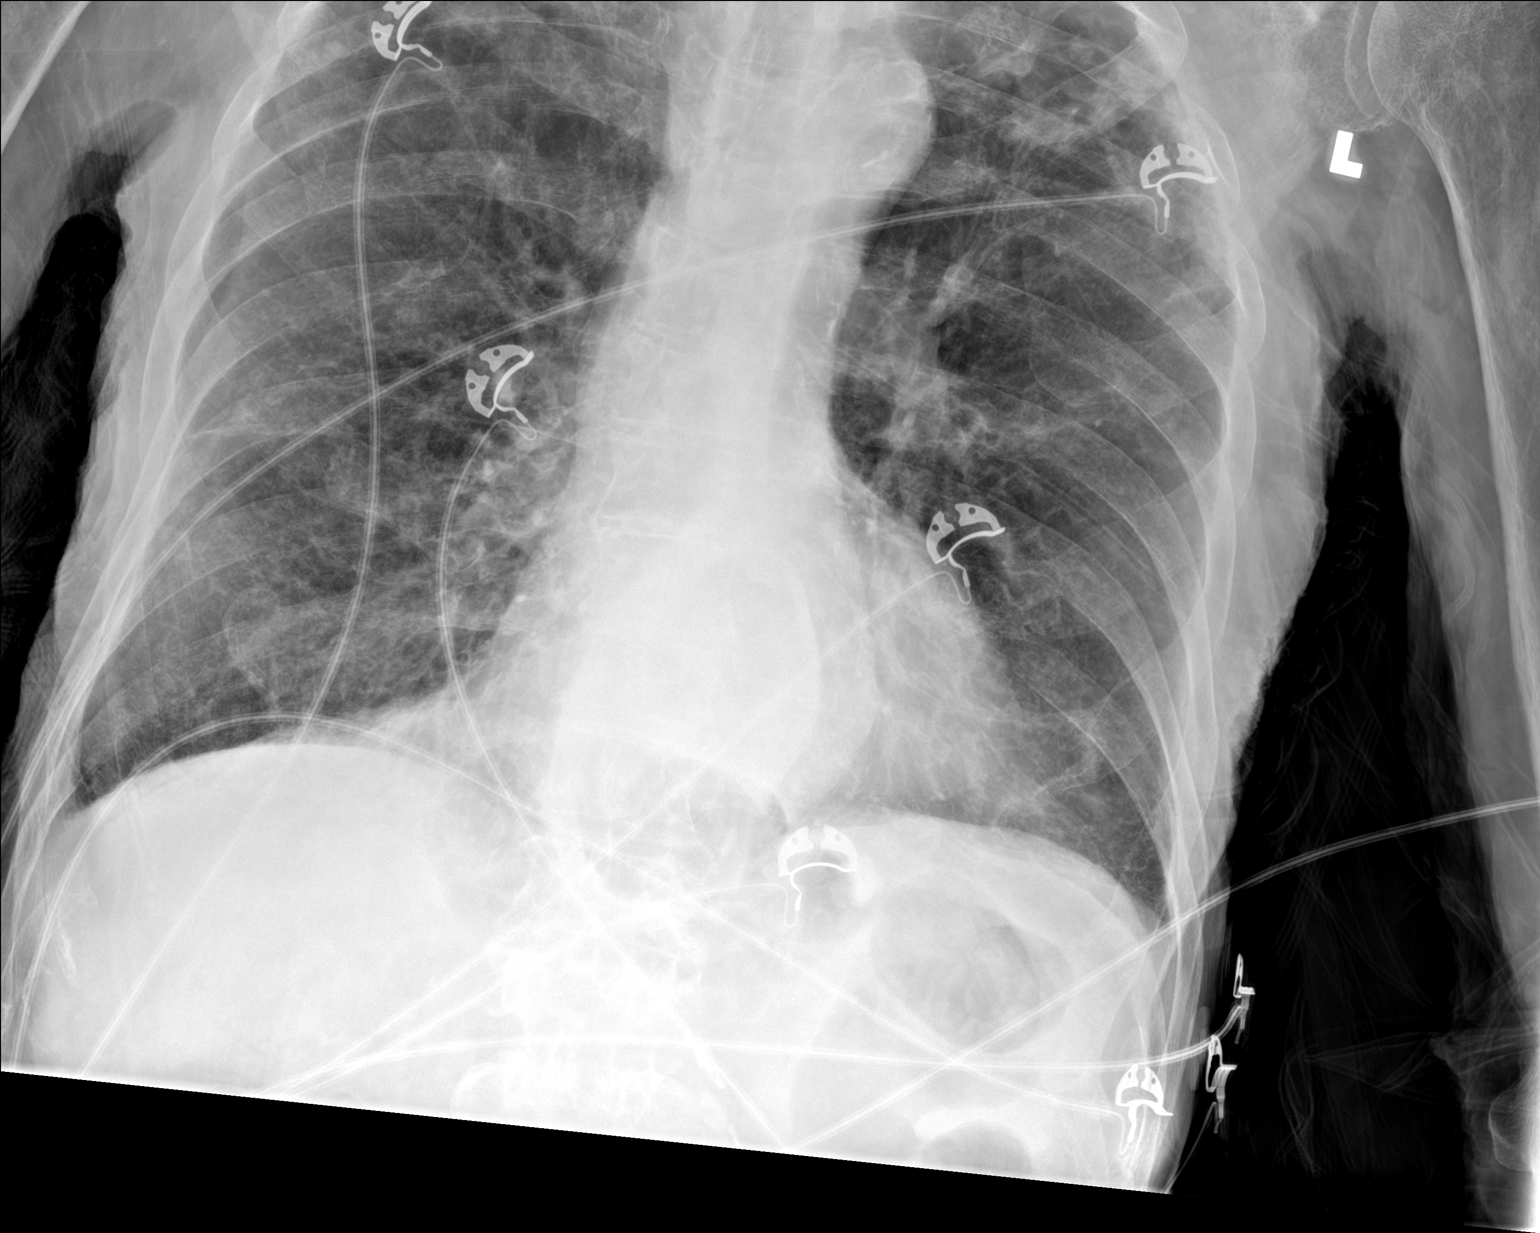

[2 of 2 positions shown; findings below may reference images not displayed]

FINDINGS: Suspected cavitary process in the left apex. Emphysematous disease.
Right lateral lung base nodule previously noted is not well seen
today. Coarse chronic interstitial opacities. Stable
cardiomediastinal silhouette. Aortic atherosclerosis. Lucency at the
right apex and laterally. Moderate hiatal hernia
IMPRESSION: 1. Emphysematous disease. Suspected cavitary process at the left
apex. Recommend chest CT for further evaluation.
2. Lucency at the right apicolateral lung, favor skin fold artifact
over small pneumothorax. This could also be evaluated at chest CT.
